# Patient Record
Sex: Female | Born: 1950
Health system: Southern US, Community
[De-identification: ages and names within clinical notes are randomized; demographics above are authoritative.]

## PROBLEM LIST (undated history)

## (undated) DIAGNOSIS — F32A Depression, unspecified: Secondary | ICD-10-CM

## (undated) DIAGNOSIS — M1990 Unspecified osteoarthritis, unspecified site: Secondary | ICD-10-CM

## (undated) DIAGNOSIS — N390 Urinary tract infection, site not specified: Secondary | ICD-10-CM

## (undated) DIAGNOSIS — K5792 Diverticulitis of intestine, part unspecified, without perforation or abscess without bleeding: Secondary | ICD-10-CM

## (undated) DIAGNOSIS — K648 Other hemorrhoids: Secondary | ICD-10-CM

## (undated) DIAGNOSIS — R51 Headache: Secondary | ICD-10-CM

## (undated) DIAGNOSIS — B019 Varicella without complication: Secondary | ICD-10-CM

## (undated) DIAGNOSIS — K219 Gastro-esophageal reflux disease without esophagitis: Secondary | ICD-10-CM

## (undated) DIAGNOSIS — G43909 Migraine, unspecified, not intractable, without status migrainosus: Secondary | ICD-10-CM

## (undated) DIAGNOSIS — Z8619 Personal history of other infectious and parasitic diseases: Secondary | ICD-10-CM

## (undated) DIAGNOSIS — T7840XA Allergy, unspecified, initial encounter: Secondary | ICD-10-CM

## (undated) DIAGNOSIS — Z148 Genetic carrier of other disease: Secondary | ICD-10-CM

## (undated) DIAGNOSIS — E785 Hyperlipidemia, unspecified: Secondary | ICD-10-CM

## (undated) DIAGNOSIS — K635 Polyp of colon: Secondary | ICD-10-CM

## (undated) HISTORY — DX: Headache: R51

## (undated) HISTORY — DX: Other hemorrhoids: K64.8

## (undated) HISTORY — DX: Urinary tract infection, site not specified: N39.0

## (undated) HISTORY — DX: Personal history of other infectious and parasitic diseases: Z86.19

## (undated) HISTORY — DX: Unspecified osteoarthritis, unspecified site: M19.90

## (undated) HISTORY — DX: Varicella without complication: B01.9

## (undated) HISTORY — DX: Diverticulitis of intestine, part unspecified, without perforation or abscess without bleeding: K57.92

## (undated) HISTORY — DX: Depression, unspecified: F32.A

## (undated) HISTORY — DX: Allergy, unspecified, initial encounter: T78.40XA

## (undated) HISTORY — DX: Polyp of colon: K63.5

## (undated) HISTORY — DX: Gastro-esophageal reflux disease without esophagitis: K21.9

## (undated) HISTORY — DX: Hyperlipidemia, unspecified: E78.5

## (undated) HISTORY — DX: Migraine, unspecified, not intractable, without status migrainosus: G43.909

## (undated) HISTORY — DX: Genetic carrier of other disease: Z14.8

---

## 1974-04-17 HISTORY — PX: CHOLECYSTECTOMY: SHX55

## 1986-04-17 HISTORY — PX: ABDOMINAL HYSTERECTOMY: SHX81

## 2004-05-09 ENCOUNTER — Ambulatory Visit: Payer: Self-pay | Admitting: Internal Medicine

## 2004-07-05 ENCOUNTER — Ambulatory Visit: Payer: Self-pay | Admitting: Internal Medicine

## 2005-10-23 ENCOUNTER — Ambulatory Visit: Payer: Self-pay | Admitting: Internal Medicine

## 2005-12-07 LAB — HM COLONOSCOPY

## 2006-04-17 HISTORY — PX: HERNIA REPAIR: SHX51

## 2006-05-02 ENCOUNTER — Ambulatory Visit: Payer: Self-pay | Admitting: Internal Medicine

## 2006-05-30 ENCOUNTER — Encounter: Payer: Self-pay | Admitting: Orthopedic Surgery

## 2006-06-16 ENCOUNTER — Encounter: Payer: Self-pay | Admitting: Orthopedic Surgery

## 2006-10-29 ENCOUNTER — Ambulatory Visit: Payer: Self-pay | Admitting: Nurse Practitioner

## 2006-12-08 LAB — HM PAP SMEAR

## 2007-09-25 ENCOUNTER — Ambulatory Visit: Payer: Self-pay | Admitting: Nurse Practitioner

## 2007-10-04 ENCOUNTER — Ambulatory Visit: Payer: Self-pay | Admitting: Nurse Practitioner

## 2007-10-31 ENCOUNTER — Ambulatory Visit: Payer: Self-pay | Admitting: Nurse Practitioner

## 2007-12-02 ENCOUNTER — Ambulatory Visit: Payer: Self-pay | Admitting: General Surgery

## 2007-12-10 ENCOUNTER — Ambulatory Visit: Payer: Self-pay | Admitting: General Surgery

## 2009-01-11 ENCOUNTER — Ambulatory Visit: Payer: Self-pay | Admitting: Nurse Practitioner

## 2010-01-17 ENCOUNTER — Ambulatory Visit: Payer: Self-pay | Admitting: Nurse Practitioner

## 2010-03-17 HISTORY — PX: GASTRIC BYPASS: SHX52

## 2011-01-31 ENCOUNTER — Ambulatory Visit: Payer: Self-pay

## 2011-03-27 ENCOUNTER — Ambulatory Visit: Payer: Self-pay

## 2011-12-08 ENCOUNTER — Ambulatory Visit (INDEPENDENT_AMBULATORY_CARE_PROVIDER_SITE_OTHER): Payer: BC Managed Care – PPO | Admitting: Internal Medicine

## 2011-12-08 ENCOUNTER — Encounter: Payer: Self-pay | Admitting: Internal Medicine

## 2011-12-08 VITALS — BP 140/82 | HR 64 | Temp 98.7°F | Ht 62.5 in | Wt 172.8 lb

## 2011-12-08 DIAGNOSIS — M25569 Pain in unspecified knee: Secondary | ICD-10-CM

## 2011-12-08 DIAGNOSIS — E669 Obesity, unspecified: Secondary | ICD-10-CM

## 2011-12-08 DIAGNOSIS — M1710 Unilateral primary osteoarthritis, unspecified knee: Secondary | ICD-10-CM

## 2011-12-08 DIAGNOSIS — M25562 Pain in left knee: Secondary | ICD-10-CM

## 2011-12-08 DIAGNOSIS — Z148 Genetic carrier of other disease: Secondary | ICD-10-CM | POA: Insufficient documentation

## 2011-12-08 DIAGNOSIS — K648 Other hemorrhoids: Secondary | ICD-10-CM

## 2011-12-08 DIAGNOSIS — Z8619 Personal history of other infectious and parasitic diseases: Secondary | ICD-10-CM | POA: Insufficient documentation

## 2011-12-08 MED ORDER — BUTALBITAL-APAP-CAFF-COD 50-325-40-30 MG PO CAPS: 1.0000 | ORAL_CAPSULE | Freq: Four times a day (QID) | ORAL | Status: DC | PRN

## 2011-12-08 NOTE — Patient Instructions (Addendum)
Return after next Monday for fasting labs.    Referral to [facility] under way

## 2011-12-08 NOTE — Progress Notes (Addendum)
Patient ID: Erin Aguilar, female   DOB: [birth year], 61 y.o.   MRN: [id number]   Patient Active Problem List  Diagnosis  . Hemorrhoids, internal, with bleeding  . Hemochromatosis carrier  . H/O: pertussis  . Knee pain, bilateral  . Obesity (BMI 30.0-34.9)    Subjective:  CC:   Chief Complaint  Patient presents with  . Establish Care    HPI:   Erin Aguilar a 61 y.o. female who presents Bilateral knee pain.  [facility] shots both knees  Left knee shots done x 3, which helped transiently.  None in 1 years.  Cannot walk or stand for prolonged periods of time.  Very painful getting out of bed.   2) Obesity .  She has a history of gastric bypass in Dec 2011  Done in [facility] Lost 60 lbs,  Wt within 2 lbs  Range.  Does not tolerate sweets without nuasea.  Wants referral to [facility],  Dr . Howell Rucks.    3) Migraine headaches ,preceidre by aura involving eyes treated with codeine containin gmedication to abort.  4) Osteoarthritis .  She cannot take  NSAIDs bc of gastric surgery which was managed with pain medication at night. Prior rheumatologic evaluation by [facility] was negative.  Transferring from [employer].  Last seen in Dec with breast exam only.   Past Medical History  Diagnosis Date  . Arthritis   . Chicken pox   . Diverticulitis   . Headache   . GERD (gastroesophageal reflux disease)   . Allergy     Hay fever  . Hyperlipidemia   . Migraines   . Colon polyp   . UTI (lower urinary tract infection)   . Hemorrhoids, internal, with bleeding     occasional  . Hemochromatosis carrier     sister has disease  . H/O: pertussis     Past Surgical History  Procedure Date  . Cholecystectomy 1976  . Abdominal hysterectomy 1988  . Gastric bypass 03/2010  . Hernia repair 2008    ventral ,  from scar tissue, [facility]         The following portions of the patient's history were reviewed and updated as appropriate: Allergies,  current medications, and problem list.    Review of Systems:   12 Pt  review of systems was negative except those addressed in the HPI,     History   Social History  . Marital Status: Married    Spouse Name: N/A    Number of Children: N/A  . Years of Education: N/A   Occupational History  . Not on file.   Social History Main Topics  . Smoking status: Former Smoker    Quit date: 12/08/1986  . Smokeless tobacco: Not on file  . Alcohol Use: 1.5 oz/week    3 drink(s) per week  . Drug Use: No  . Sexually Active: Not on file   Other Topics Concern  . Not on file   Social History Narrative  . No narrative on file    Objective:  BP 140/82  Pulse 64  Temp 98.7 F (37.1 C) (Oral)  Ht 5' 2.5" (1.588 m)  Wt 172 lb 12 oz (78.359 kg)  BMI 31.09 kg/m2  SpO2 97%  General appearance: alert, cooperative and appears stated age Ears: normal TM's and external ear canals both ears Throat: lips, mucosa, and tongue normal; teeth and gums normal Neck: no adenopathy, no carotid bruit, supple, symmetrical, trachea midline and thyroid  not enlarged, symmetric, no tenderness/mass/nodules Back: symmetric, no curvature. ROM normal. No CVA tenderness. Lungs: clear to auscultation bilaterally Heart: regular rate and rhythm, S1, S2 normal, no murmur, click, rub or gallop Abdomen: soft, non-tender; bowel sounds normal; no masses,  no organomegaly Pulses: 2+ and symmetric Skin: Skin color, texture, turgor normal. No rashes or lesions Lymph nodes: Cervical, supraclavicular, and axillary nodes normal.  Assessment and Plan:  Knee pain, bilateral Secondary to DJD.  Failed prior trial of synvisc and steroids. She is requesting referral to [facility] Dr. Joylene John.    Obesity (BMI 30.0-34.9) With prior gastric bypass surgery achieving wt loss of 60 lbs.  Will need fasting labs and vit D, B12 levels given current iatrogenically induced malnutrition.   Hemorrhoids, internal, with  bleeding continue fiber supplement and prn use of steroid suppositories.    Updated Medication List Outpatient Encounter Prescriptions as of 12/08/2011  Medication Sig Dispense Refill  . B-12, METHYLCOBALAMIN, SL Place 1 tablet under the tongue daily.      . butalbital-acetaminophen-caffeine (FIORICET WITH CODEINE) 50-325-40-30 MG per capsule Take 1 capsule by mouth every 6 (six) hours as needed.  60 capsule  2  . calcium-vitamin D (OSCAL) 250-125 MG-UNIT per tablet Take 1 tablet by mouth daily.      . cetirizine (ZYRTEC) 10 MG tablet Take 10 mg by mouth daily.      . DULoxetine (CYMBALTA) 60 MG capsule Take 60 mg by mouth daily.      Marland Kitchen esomeprazole (NEXIUM) 40 MG capsule Take 40 mg by mouth daily before breakfast.      . Multiple Vitamin (MULTIVITAMIN) tablet Take 1 tablet by mouth daily.      . promethazine (PHENERGAN) 25 MG tablet Take 25 mg by mouth every 6 (six) hours as needed.      Marland Kitchen DISCONTD: butalbital-acetaminophen-caffeine (FIORICET WITH CODEINE) 50-325-40-30 MG per capsule Take 1 capsule by mouth every 6 (six) hours as needed.          Orders Placed This Encounter  Procedures  . HM MAMMOGRAPHY  . HM MAMMOGRAPHY  . HM PAP SMEAR  . Ambulatory referral to Orthopedic Surgery  . HM COLONOSCOPY    No Follow-up on file.

## 2011-12-09 DIAGNOSIS — E669 Obesity, unspecified: Secondary | ICD-10-CM | POA: Insufficient documentation

## 2011-12-09 DIAGNOSIS — Z96653 Presence of artificial knee joint, bilateral: Secondary | ICD-10-CM | POA: Insufficient documentation

## 2011-12-09 NOTE — Assessment & Plan Note (Signed)
Secondary to DJD.  Failed prior trial of synvisc and steroids. She is requesting referral to [facility] Dr. Joylene John.

## 2011-12-09 NOTE — Assessment & Plan Note (Signed)
continue fiber supplement and prn use of steroid suppositories.

## 2011-12-09 NOTE — Assessment & Plan Note (Signed)
With prior gastric bypass surgery achieving wt loss of 60 lbs.  Will need fasting labs and vit D, B12 levels given current iatrogenically induced malnutrition.

## 2011-12-14 ENCOUNTER — Other Ambulatory Visit (INDEPENDENT_AMBULATORY_CARE_PROVIDER_SITE_OTHER): Payer: BC Managed Care – PPO | Admitting: *Deleted

## 2011-12-14 DIAGNOSIS — Z0000 Encounter for general adult medical examination without abnormal findings: Secondary | ICD-10-CM

## 2011-12-14 DIAGNOSIS — E785 Hyperlipidemia, unspecified: Secondary | ICD-10-CM

## 2011-12-14 DIAGNOSIS — E559 Vitamin D deficiency, unspecified: Secondary | ICD-10-CM

## 2011-12-14 LAB — COMPREHENSIVE METABOLIC PANEL
ALT: 25 U/L (ref 0–35)
Albumin: 4.1 g/dL (ref 3.5–5.2)
CO2: 28 mEq/L (ref 19–32)
Chloride: 104 mEq/L (ref 96–112)
GFR: 85.46 mL/min (ref >60.00)
Glucose, Bld: 84 mg/dL (ref 70–99)
Potassium: 4.2 mEq/L (ref 3.5–5.1)
Sodium: 140 mEq/L (ref 135–145)
Total Bilirubin: 0.8 mg/dL (ref 0.3–1.2)
Total Protein: 7 g/dL (ref 6.0–8.3)

## 2011-12-14 LAB — CBC WITH DIFFERENTIAL/PLATELET
Basophils Absolute: 0 10*3/uL (ref 0.0–0.1)
Eosinophils Relative: 4.9 % (ref 0.0–5.0)
HCT: 39.3 % (ref 36.0–46.0)
Lymphocytes Relative: 39.3 % (ref 12.0–46.0)
Lymphs Abs: 1.5 10*3/uL (ref 0.7–4.0)
Monocytes Absolute: 0.3 10*3/uL (ref 0.1–1.0)
Monocytes Relative: 8.7 % (ref 3.0–12.0)
Platelets: 203 10*3/uL (ref 150.0–400.0)
RDW: 12.3 % (ref 11.5–14.6)
WBC: 3.8 10*3/uL — ABNORMAL LOW (ref 4.5–10.5)

## 2011-12-14 LAB — LIPID PANEL
HDL: 56.6 mg/dL (ref >39.00)
Total CHOL/HDL Ratio: 3

## 2011-12-14 LAB — FERRITIN: Ferritin: 59 ng/mL (ref 10.0–291.0)

## 2011-12-14 LAB — VITAMIN B12: Vitamin B-12: >1500 pg/mL — ABNORMAL HIGH (ref 211–911)

## 2011-12-15 LAB — IRON AND TIBC
%SAT: 36 % (ref 20–55)
Iron: 100 ug/dL (ref 42–145)
TIBC: 281 ug/dL (ref 250–470)
UIBC: 181 ug/dL (ref 125–400)

## 2012-04-18 ENCOUNTER — Other Ambulatory Visit: Payer: Self-pay | Admitting: *Deleted

## 2012-04-18 MED ORDER — DULOXETINE HCL 60 MG PO CPEP: 60.0000 mg | ORAL_CAPSULE | Freq: Every day | ORAL | Status: DC

## 2012-04-18 NOTE — Telephone Encounter (Signed)
Ok to refill,  30 with 3 refills

## 2012-04-18 NOTE — Telephone Encounter (Signed)
Refill on cymbalta sent to [facility]. Patient notified.

## 2012-04-18 NOTE — Telephone Encounter (Signed)
Patient request refill on medication Cymbalta. Last office visit 11/2011. Please advise.

## 2012-05-25 LAB — HM PAP SMEAR

## 2012-06-10 ENCOUNTER — Ambulatory Visit (INDEPENDENT_AMBULATORY_CARE_PROVIDER_SITE_OTHER): Payer: BC Managed Care – PPO | Admitting: Internal Medicine

## 2012-06-10 ENCOUNTER — Encounter: Payer: Self-pay | Admitting: Internal Medicine

## 2012-06-10 VITALS — BP 110/82 | HR 60 | Temp 97.7°F | Ht 62.75 in | Wt 173.0 lb

## 2012-06-10 DIAGNOSIS — IMO0002 Reserved for concepts with insufficient information to code with codable children: Secondary | ICD-10-CM

## 2012-06-10 DIAGNOSIS — Z13220 Encounter for screening for lipoid disorders: Secondary | ICD-10-CM

## 2012-06-10 DIAGNOSIS — Z0000 Encounter for general adult medical examination without abnormal findings: Secondary | ICD-10-CM | POA: Insufficient documentation

## 2012-06-10 DIAGNOSIS — M8580 Other specified disorders of bone density and structure, unspecified site: Secondary | ICD-10-CM

## 2012-06-10 DIAGNOSIS — Z1239 Encounter for other screening for malignant neoplasm of breast: Secondary | ICD-10-CM

## 2012-06-10 DIAGNOSIS — N8111 Cystocele, midline: Secondary | ICD-10-CM

## 2012-06-10 DIAGNOSIS — K912 Postsurgical malabsorption, not elsewhere classified: Secondary | ICD-10-CM

## 2012-06-10 DIAGNOSIS — M25569 Pain in unspecified knee: Secondary | ICD-10-CM

## 2012-06-10 DIAGNOSIS — E669 Obesity, unspecified: Secondary | ICD-10-CM

## 2012-06-10 DIAGNOSIS — M25561 Pain in right knee: Secondary | ICD-10-CM

## 2012-06-10 DIAGNOSIS — Z9884 Bariatric surgery status: Secondary | ICD-10-CM

## 2012-06-10 DIAGNOSIS — R7989 Other specified abnormal findings of blood chemistry: Secondary | ICD-10-CM

## 2012-06-10 DIAGNOSIS — M899 Disorder of bone, unspecified: Secondary | ICD-10-CM

## 2012-06-10 LAB — CBC WITH DIFFERENTIAL/PLATELET
Eosinophils Relative: 2.1 % (ref 0.0–5.0)
HCT: 40.6 % (ref 36.0–46.0)
Hemoglobin: 13.8 g/dL (ref 12.0–15.0)
Lymphs Abs: 1.9 10*3/uL (ref 0.7–4.0)
MCHC: 33.9 g/dL (ref 30.0–36.0)
Monocytes Relative: 7 % (ref 3.0–12.0)
Neutro Abs: 3.7 10*3/uL (ref 1.4–7.7)
Platelets: 212 10*3/uL (ref 150.0–400.0)
RBC: 4.12 Mil/uL (ref 3.87–5.11)
WBC: 6.1 10*3/uL (ref 4.5–10.5)

## 2012-06-10 LAB — COMPREHENSIVE METABOLIC PANEL
ALT: 50 U/L — ABNORMAL HIGH (ref 0–35)
Albumin: 4.2 g/dL (ref 3.5–5.2)
Alkaline Phosphatase: 88 U/L (ref 39–117)
CO2: 28 mEq/L (ref 19–32)
GFR: 94.2 mL/min (ref >60.00)
Glucose, Bld: 96 mg/dL (ref 70–99)
Sodium: 140 mEq/L (ref 135–145)
Total Bilirubin: 1 mg/dL (ref 0.3–1.2)
Total Protein: 7.2 g/dL (ref 6.0–8.3)

## 2012-06-10 LAB — IRON AND TIBC
Iron: 148 ug/dL — ABNORMAL HIGH (ref 42–145)
TIBC: 323 ug/dL (ref 250–470)
UIBC: 175 ug/dL (ref 125–400)

## 2012-06-10 LAB — LIPID PANEL
Cholesterol: 202 mg/dL — ABNORMAL HIGH (ref 0–200)
VLDL: 25.6 mg/dL (ref 0.0–40.0)

## 2012-06-10 LAB — LDL CHOLESTEROL, DIRECT: Direct LDL: 106 mg/dL

## 2012-06-10 LAB — MAGNESIUM: Magnesium: 2.2 mg/dL (ref 1.5–2.5)

## 2012-06-10 MED ORDER — BUTALBITAL-APAP-CAFF-COD 50-325-40-30 MG PO CAPS: 1.0000 | ORAL_CAPSULE | Freq: Four times a day (QID) | ORAL | Status: DC | PRN

## 2012-06-10 MED ORDER — DULOXETINE HCL 60 MG PO CPEP: 60.0000 mg | ORAL_CAPSULE | Freq: Every day | ORAL | Status: DC

## 2012-06-10 MED ORDER — PROMETHAZINE HCL 25 MG PO TABS: 25.0000 mg | ORAL_TABLET | Freq: Four times a day (QID) | ORAL | Status: DC | PRN

## 2012-06-10 NOTE — Assessment & Plan Note (Signed)
Annual exam including breast , pelvic and PAP smear were done today. screenings were al brought up to date.

## 2012-06-10 NOTE — Assessment & Plan Note (Signed)
I have addressed  BMI and recommended a low glycemic index diet utilizing smaller more frequent meals to increase metabolism.  I have also recommended that patient start exercising with a goal of 30 minutes of aerobic exercise a minimum of 5 days per week. Screening for lipid disorders, thyroid and diabetes to be done today.   

## 2012-06-10 NOTE — Progress Notes (Signed)
Patient ID: Erin Aguilar, female   DOB: 1950-11-10, 62 y.o.   MRN: [id number]    Subjective:     Erin Aguilar is a 62 y.o. female and is here for a comprehensive physical exam. The patient reports m.  History   Social History  . Marital Status: Married    Spouse Name: N/A    Number of Children: N/A  . Years of Education: N/A   Occupational History  . Not on file.   Social History Main Topics  . Smoking status: Former Smoker    Quit date: 12/08/1986  . Smokeless tobacco: Not on file  . Alcohol Use: 1.5 oz/week    3 drink(s) per week  . Drug Use: No  . Sexually Active: Not on file   Other Topics Concern  . Not on file   Social History Narrative  . No narrative on file   Health Maintenance  Topic Date Due  . Influenza Vaccine  12/16/1948  . Tetanus/tdap  07/06/1967  . Zostavax  07/05/2008  . Pap Smear  12/07/2009  . Mammogram  03/26/2013  . Colonoscopy  12/08/2015    The following portions of the patient's history were reviewed and updated as appropriate: allergies, current medications, past family history, past medical history, past social history, past surgical history and problem list.  Review of Systems A comprehensive review of systems was negative.   Objective:    BP 110/82  Pulse 60  Temp(Src) 97.7 F (36.5 C) (Oral)  Ht 5' 2.75" (1.594 m)  Wt 173 lb (78.472 kg)  BMI 30.88 kg/m2  SpO2 97%   BP 110/82  Pulse 60  Temp(Src) 97.7 F (36.5 C) (Oral)  Ht 5' 2.75" (1.594 m)  Wt 173 lb (78.472 kg)  BMI 30.88 kg/m2  SpO2 97%  General Appearance:    Alert, cooperative, no distress, appears stated age  Head:    Normocephalic, without obvious abnormality, atraumatic  Eyes:    PERRL, conjunctiva/corneas clear, EOM's intact, fundi    benign, both eyes  Ears:    Normal TM's and external ear canals, both ears  Nose:   Nares normal, septum midline, mucosa normal, no drainage    or sinus tenderness  Throat:   Lips, mucosa, and tongue normal; teeth and  gums normal  Neck:   Supple, symmetrical, trachea midline, no adenopathy;    thyroid:  no enlargement/tenderness/nodules; no carotid   bruit or JVD  Back:     Symmetric, no curvature, ROM normal, no CVA tenderness  Lungs:     Clear to auscultation bilaterally, respirations unlabored  Chest Wall:    No tenderness or deformity   Heart:    Regular rate and rhythm, S1 and S2 normal, no murmur, rub   or gallop  Breast Exam:    No tenderness, masses, or nipple abnormality  Abdomen:     Soft, non-tender, bowel sounds active all four quadrants,    no masses, no organomegaly  Genitalia:    Pelvic: cervix normal in appearance, external genitalia normal, no adnexal masses or tenderness, no cervical motion tenderness, positive findings: cystocele, rectovaginal septum normal, uterus normal size, shape, and consistency and vagina normal without discharge     Extremities:   Extremities normal, atraumatic, no cyanosis or edema  Pulses:   2+ and symmetric all extremities  Skin:   Skin color, texture, turgor normal, no rashes or lesions  Lymph nodes:   Cervical, supraclavicular, and axillary nodes normal  Neurologic:   CNII-XII intact, normal strength,  sensation and reflexes    throughout     Assessment:   Obesity (BMI 30.0-34.9) I have addressed  BMI and recommended a low glycemic index diet utilizing smaller more frequent meals to increase metabolism.  I have also recommended that patient start exercising with a goal of 30 minutes of aerobic exercise a minimum of 5 days per week. Screening for lipid disorders, thyroid and diabetes to be done today.    Knee pain, bilateral Discussed need to consider treatmetn with TKR before her weigjt regain problems problematic  Routine general medical examination at a health care facility Annual exam including breast , pelvic and PAP smear were done today. screenings were al brought up to date.    Updated Medication List Outpatient Encounter Prescriptions as  of 06/10/2012  Medication Sig Dispense Refill  . B-12, METHYLCOBALAMIN, SL Place 1 tablet under the tongue daily.      . butalbital-acetaminophen-caffeine (FIORICET WITH CODEINE) 50-325-40-30 MG per capsule Take 1 capsule by mouth every 6 (six) hours as needed.  60 capsule  2  . calcium-vitamin D (OSCAL) 250-125 MG-UNIT per tablet Take 1 tablet by mouth daily.      . cetirizine (ZYRTEC) 10 MG tablet Take 10 mg by mouth daily.      . DULoxetine (CYMBALTA) 60 MG capsule Take 1 capsule (60 mg total) by mouth daily.  30 capsule  3  . esomeprazole (NEXIUM) 40 MG capsule Take 40 mg by mouth daily before breakfast.      . Multiple Vitamin (MULTIVITAMIN) tablet Take 1 tablet by mouth daily.      . promethazine (PHENERGAN) 25 MG tablet Take 1 tablet (25 mg total) by mouth every 6 (six) hours as needed.  30 tablet  1  . [DISCONTINUED] butalbital-acetaminophen-caffeine (FIORICET WITH CODEINE) 50-325-40-30 MG per capsule Take 1 capsule by mouth every 6 (six) hours as needed.  60 capsule  2  . [DISCONTINUED] DULoxetine (CYMBALTA) 60 MG capsule Take 1 capsule (60 mg total) by mouth daily.  30 capsule  3  . [DISCONTINUED] promethazine (PHENERGAN) 25 MG tablet Take 25 mg by mouth every 6 (six) hours as needed.       No facility-administered encounter medications on file as of 06/10/2012.

## 2012-06-10 NOTE — Progress Notes (Signed)
Patient requested refills on medications.

## 2012-06-10 NOTE — Assessment & Plan Note (Addendum)
Discussed need to consider treatmetn with TKR before her weigjt regain problems problematic

## 2012-06-10 NOTE — Patient Instructions (Addendum)
You have a cystocele (bladder is starting to fall from its original position in the pelvis).  I will refer you to [facility] for treatment   This can be treated with a pessary or with surgery   Please see =Amber to set up your mammogram and DEXA scan

## 2012-06-11 LAB — VITAMIN D 25 HYDROXY (VIT D DEFICIENCY, FRACTURES): Vit D, 25-Hydroxy: 26 ng/mL — ABNORMAL LOW (ref 30–89)

## 2012-06-12 DIAGNOSIS — R768 Other specified abnormal immunological findings in serum: Secondary | ICD-10-CM | POA: Insufficient documentation

## 2012-06-12 NOTE — Addendum Note (Signed)
Addended by: Sherlene Shams on: 06/12/2012 06:29 AM   Modules accepted: Orders

## 2012-06-13 ENCOUNTER — Ambulatory Visit: Payer: Self-pay | Admitting: Internal Medicine

## 2012-06-18 ENCOUNTER — Telehealth: Payer: Self-pay | Admitting: Internal Medicine

## 2012-06-18 DIAGNOSIS — M8580 Other specified disorders of bone density and structure, unspecified site: Secondary | ICD-10-CM

## 2012-06-18 DIAGNOSIS — M810 Age-related osteoporosis without current pathological fracture: Secondary | ICD-10-CM | POA: Insufficient documentation

## 2012-06-18 NOTE — Telephone Encounter (Signed)
Her  DEXA scan  showed mild to moderate bone loss in the osteopenia range. I recommend regular weight bearing exercise, 1200 mg of calcium (combined diet and supplements) ,  A Minimum of 1000 units of vitamin  D daily and  repeating it in 2 years to reassess.

## 2012-06-18 NOTE — Telephone Encounter (Signed)
Patient notified as instructed by telephone. 

## 2012-06-27 ENCOUNTER — Telehealth: Payer: Self-pay | Admitting: Internal Medicine

## 2012-06-27 MED ORDER — ESOMEPRAZOLE MAGNESIUM 40 MG PO CPDR: 40.0000 mg | DELAYED_RELEASE_CAPSULE | Freq: Every day | ORAL | Status: DC

## 2012-06-27 NOTE — Telephone Encounter (Signed)
Nexium refill needed.  [facility] [place].

## 2012-06-27 NOTE — Telephone Encounter (Signed)
Medication filled.  

## 2012-07-02 ENCOUNTER — Ambulatory Visit: Payer: Self-pay | Admitting: Internal Medicine

## 2012-07-04 ENCOUNTER — Encounter: Payer: Self-pay | Admitting: Internal Medicine

## 2012-07-10 ENCOUNTER — Other Ambulatory Visit (INDEPENDENT_AMBULATORY_CARE_PROVIDER_SITE_OTHER): Payer: BC Managed Care – PPO

## 2012-07-10 DIAGNOSIS — R7989 Other specified abnormal findings of blood chemistry: Secondary | ICD-10-CM

## 2012-07-10 LAB — HEPATIC FUNCTION PANEL
ALT: 36 U/L — ABNORMAL HIGH (ref 0–35)
AST: 24 U/L (ref 0–37)
Total Bilirubin: 0.7 mg/dL (ref 0.3–1.2)

## 2012-07-11 NOTE — Addendum Note (Signed)
Addended by: Sherlene Shams on: 07/11/2012 06:53 AM   Modules accepted: Orders

## 2012-07-15 ENCOUNTER — Other Ambulatory Visit (INDEPENDENT_AMBULATORY_CARE_PROVIDER_SITE_OTHER): Payer: BC Managed Care – PPO

## 2012-07-15 ENCOUNTER — Ambulatory Visit: Payer: Self-pay | Admitting: Internal Medicine

## 2012-07-15 DIAGNOSIS — R7989 Other specified abnormal findings of blood chemistry: Secondary | ICD-10-CM

## 2012-07-15 DIAGNOSIS — R768 Other specified abnormal immunological findings in serum: Secondary | ICD-10-CM

## 2012-07-15 LAB — IRON AND TIBC
Iron: 125 ug/dL (ref 42–145)
TIBC: 291 ug/dL (ref 250–470)
UIBC: 166 ug/dL (ref 125–400)

## 2012-07-15 LAB — FERRITIN: Ferritin: 65.8 ng/mL (ref 10.0–291.0)

## 2012-07-16 ENCOUNTER — Telehealth: Payer: Self-pay | Admitting: Internal Medicine

## 2012-07-16 LAB — HEPATITIS C ANTIBODY: HCV Ab: REACTIVE — AB

## 2012-07-16 LAB — ANTI-SMITH ANTIBODY: ENA SM Ab Ser-aCnc: 1 AU/mL (ref <30)

## 2012-07-16 LAB — ANTI-NUCLEAR AB-TITER (ANA TITER): ANA Titer 1: 1:80 {titer} — ABNORMAL HIGH

## 2012-07-16 LAB — HEPATITIS B CORE ANTIBODY, TOTAL: Hep B Core Total Ab: NEGATIVE

## 2012-07-16 LAB — HEPATITIS B SURFACE ANTIBODY,QUALITATIVE: Hep B S Ab: REACTIVE — AB

## 2012-07-16 NOTE — Addendum Note (Signed)
Addended by: Sherlene Shams on: 07/16/2012 06:56 AM   Modules accepted: Orders

## 2012-07-16 NOTE — Progress Notes (Signed)
Called pt was notified to call again after lunch.

## 2012-07-17 NOTE — Progress Notes (Signed)
Pt notified and labs faxed 

## 2012-07-24 ENCOUNTER — Encounter: Payer: Self-pay | Admitting: Internal Medicine

## 2012-07-31 ENCOUNTER — Encounter: Payer: Self-pay | Admitting: Internal Medicine

## 2012-10-02 ENCOUNTER — Telehealth: Payer: Self-pay | Admitting: Internal Medicine

## 2012-10-02 ENCOUNTER — Other Ambulatory Visit: Payer: Self-pay | Admitting: *Deleted

## 2012-10-02 NOTE — Telephone Encounter (Signed)
Pt spouse came in stated he went to pick up ms Stawicki rx for nexium  [facility] store stated it needed prior autho Pt is completely out of her meds

## 2012-10-03 MED ORDER — DULOXETINE HCL 60 MG PO CPEP: 60.0000 mg | ORAL_CAPSULE | Freq: Every day | ORAL | Status: DC

## 2012-10-03 NOTE — Telephone Encounter (Signed)
Erin Aguilar,  She can take OTC prevacid, zegerid or prilosec.  Please warn her that because I do not have a diagnosis of gastritis or Reflux ( required diagnoses and with  prior treatment failures documented using other proton pump inhibitors liek the ones I mentioned), she will not get approved by insurance so I would like her to try one of these first  For two weeks

## 2012-10-04 NOTE — Telephone Encounter (Signed)
Ok, we'll try with that information to get the PA

## 2012-10-04 NOTE — Telephone Encounter (Signed)
Patient states that she tried Aciphex at [facility] clinic in [place] and the prevacid does not help. Patient stated nothing over the counter is strong enough.

## 2012-11-13 ENCOUNTER — Telehealth: Payer: Self-pay | Admitting: Internal Medicine

## 2012-11-13 NOTE — Telephone Encounter (Signed)
Her insurance will no longer pay for her nexium unless we  Can document prior treatment failure with the OTC meds, like prilosec or prevacid, She will have to switch for a month to one of them  .  Due to the volume of these mandated changes we have been inundated with for every patient , we simply cannot devote the time (20 minutes per request per patient) to do prior authorizations unless it is absolutely necessary.  Therefore I would like her to try the new medication before making any requests for prior authorizations.

## 2012-11-15 ENCOUNTER — Telehealth: Payer: Self-pay | Admitting: *Deleted

## 2012-11-15 NOTE — Telephone Encounter (Signed)
Called and stated prior authorization needed by insurance called pharmacy form is being faxed to today.

## 2012-11-15 NOTE — Telephone Encounter (Signed)
Please disregard previous note talked with patient and she is willing to try some of the OTC recommendations you offered.

## 2013-01-22 ENCOUNTER — Encounter: Payer: Self-pay | Admitting: Internal Medicine

## 2013-01-22 ENCOUNTER — Ambulatory Visit (INDEPENDENT_AMBULATORY_CARE_PROVIDER_SITE_OTHER): Payer: BC Managed Care – PPO | Admitting: Internal Medicine

## 2013-01-22 VITALS — BP 118/72 | HR 64 | Temp 98.3°F | Resp 14 | Ht 62.75 in | Wt 175.8 lb

## 2013-01-22 DIAGNOSIS — G2581 Restless legs syndrome: Secondary | ICD-10-CM

## 2013-01-22 DIAGNOSIS — D508 Other iron deficiency anemias: Secondary | ICD-10-CM | POA: Insufficient documentation

## 2013-01-22 DIAGNOSIS — E66811 Obesity, class 1: Secondary | ICD-10-CM

## 2013-01-22 DIAGNOSIS — Z23 Encounter for immunization: Secondary | ICD-10-CM

## 2013-01-22 DIAGNOSIS — E669 Obesity, unspecified: Secondary | ICD-10-CM

## 2013-01-22 DIAGNOSIS — R894 Abnormal immunological findings in specimens from other organs, systems and tissues: Secondary | ICD-10-CM

## 2013-01-22 DIAGNOSIS — R768 Other specified abnormal immunological findings in serum: Secondary | ICD-10-CM

## 2013-01-22 DIAGNOSIS — R7689 Other specified abnormal immunological findings in serum: Secondary | ICD-10-CM

## 2013-01-22 MED ORDER — ROPINIROLE HCL 0.25 MG PO TABS: ORAL_TABLET | ORAL | Status: DC

## 2013-01-22 NOTE — Patient Instructions (Signed)
We are starting requip for restless legs  Start with 1 tablet ., take it 1 to 3 hours before bedtime   You may increase the dose each week by 1 tablet if needed until you reach a maximum of 4 tablets before bedtime

## 2013-01-22 NOTE — Progress Notes (Signed)
Patient ID: Erin Aguilar, female   DOB: 1950-12-11, 62 y.o.   MRN: [id number]   Patient Active Problem List   Diagnosis Date Noted  . Restless legs syndrome 01/22/2013  . Osteopenia 06/18/2012  . Hepatitis C antibody test positive 06/12/2012  . Routine general medical examination at a health care facility 06/10/2012  . Knee pain, bilateral 12/09/2011  . Obesity (BMI 30.0-34.9) 12/09/2011  . Hemorrhoids, internal, with bleeding   . Hemochromatosis carrier   . H/O: pertussis     Subjective:  CC:   Chief Complaint  Patient presents with  . Acute Visit    legs hurt and jerk at night  can not keep still.    HPI:   Erin Aguilar a 62 y.o. female who presents for evaluation of Uncomfortable feeling in her legs.  Symptoms started around 5 years ago,  Improved temporally  after her gastric bypass surgery in Dec 2011, but have returned.  Describes restless feeling in legs when she is relaxing at night,  occurs only with rest and is keeping her awake at night,  Feeling is improved temporarily with movement of legs. She is also having bilateral knee pain due to degenerative joint  disease.  She is keeping husband awake .  No snoring reported.     Past Medical History  Diagnosis Date  . Arthritis   . Chicken pox   . Diverticulitis   . Headache(784.0)   . GERD (gastroesophageal reflux disease)   . Allergy     Hay fever  . Hyperlipidemia   . Migraines   . Colon polyp   . UTI (lower urinary tract infection)   . Hemorrhoids, internal, with bleeding     occasional  . Hemochromatosis carrier     sister has disease  . H/O: pertussis     Past Surgical History  Procedure Laterality Date  . Cholecystectomy  1976  . Abdominal hysterectomy  1988  . Gastric bypass  03/2010  . Hernia repair  2008    ventral ,  from scar tissue, [place]       The following portions of the patient's history were reviewed and updated as appropriate: Allergies, current medications, and problem  list.    Review of Systems:   12 Pt  review of systems was negative except those addressed in the HPI,     History   Social History  . Marital Status: Married    Spouse Name: N/A    Number of Children: N/A  . Years of Education: N/A   Occupational History  . Not on file.   Social History Main Topics  . Smoking status: Former Smoker    Quit date: 12/08/1986  . Smokeless tobacco: Not on file  . Alcohol Use: 1.5 oz/week    3 drink(s) per week  . Drug Use: No  . Sexual Activity: Not on file   Other Topics Concern  . Not on file   Social History Narrative  . No narrative on file    Objective:  Filed Vitals:   01/22/13 1641  BP: 118/72  Pulse: 64  Temp: 98.3 F (36.8 C)  Resp: 14     General appearance: alert, cooperative and appears stated age Ears: normal TM's and external ear canals both ears Throat: lips, mucosa, and tongue normal; teeth and gums normal Neck: no adenopathy, no carotid bruit, supple, symmetrical, trachea midline and thyroid not enlarged, symmetric, no tenderness/mass/nodules Back: symmetric, no curvature. ROM normal. No CVA tenderness. Lungs: clear to auscultation  bilaterally Heart: regular rate and rhythm, S1, S2 normal, no murmur, click, rub or gallop Abdomen: soft, non-tender; bowel sounds normal; no masses,  no organomegaly Pulses: 2+ and symmetric Skin: Skin color, texture, turgor normal. No rashes or lesions Lymph nodes: Cervical, supraclavicular, and axillary nodes normal.  Assessment and Plan:  Restless legs syndrome trila of requip.  Iron studies were normal  in April for elevated liver enzymes.   Obesity (BMI 30.0-34.9) She remains obese despite undergoing bariatric surgery in Dec 2011. Barriers to additional wt loss include lack of exercise due to bilateral knee pain from DJD.I have addressed  BMI and recommended wt loss of 10% of body weigh over the next 6 months using a low glycemic index diet and water aerobics for  exercise a minimum of 5 days per week.    Hepatitis C antibody test positive Referred to Clydie Braun April 2014. Unclear if patient returned for follow up on genotyping .  She needs to have Hep A and B vaccines started at next visit and follow up with DF    Updated Medication List Outpatient Encounter Prescriptions as of 01/22/2013  Medication Sig Dispense Refill  . butalbital-acetaminophen-caffeine (FIORICET WITH CODEINE) 50-325-40-30 MG per capsule Take 1 capsule by mouth every 6 (six) hours as needed.  60 capsule  2  . calcium-vitamin D (OSCAL) 250-125 MG-UNIT per tablet Take 1 tablet by mouth daily.      . cetirizine (ZYRTEC) 10 MG tablet Take 10 mg by mouth daily.      . DULoxetine (CYMBALTA) 60 MG capsule Take 1 capsule (60 mg total) by mouth daily.  30 capsule  3  . esomeprazole (NEXIUM) 40 MG capsule Take 1 capsule (40 mg total) by mouth daily before breakfast.  30 capsule  6  . Multiple Vitamin (MULTIVITAMIN) tablet Take 1 tablet by mouth daily.      . promethazine (PHENERGAN) 25 MG tablet Take 1 tablet (25 mg total) by mouth every 6 (six) hours as needed.  30 tablet  1  . rOPINIRole (REQUIP) 0.25 MG tablet 1 tablet up to 3 hours before bedtime.May increase weekly by 1 tablet (2 tablets week 2, 3 tablets week 3 ) up to 4 tablets maximum if needed  70 tablet  0  . [DISCONTINUED] B-12, METHYLCOBALAMIN, SL Place 1 tablet under the tongue daily.       No facility-administered encounter medications on file as of 01/22/2013.     Orders Placed This Encounter  Procedures  . Tdap vaccine greater than or equal to 7yo IM  . Pneumococcal conjugate vaccine 13-valent less than 5yo IM    No Follow-up on file.

## 2013-01-22 NOTE — Assessment & Plan Note (Addendum)
trila of requip.  Iron studies were normal  in April for elevated liver enzymes.

## 2013-01-25 NOTE — Assessment & Plan Note (Addendum)
Referred to Erin Aguilar April 2014. Unclear if patient returned for follow up on genotyping .  She needs to have Hep A and B vaccines started at next visit and follow up with DF

## 2013-01-25 NOTE — Assessment & Plan Note (Signed)
She remains obese despite undergoing bariatric surgery in Dec 2011. Barriers to additional wt loss include lack of exercise due to bilateral knee pain from DJD.I have addressed  BMI and recommended wt loss of 10% of body weigh over the next 6 months using a low glycemic index diet and water aerobics for exercise a minimum of 5 days per week.

## 2013-02-13 ENCOUNTER — Other Ambulatory Visit: Payer: Self-pay | Admitting: Internal Medicine

## 2013-02-14 ENCOUNTER — Other Ambulatory Visit: Payer: Self-pay | Admitting: *Deleted

## 2013-02-14 MED ORDER — BUTALBITAL-APAP-CAFF-COD 50-325-40-30 MG PO CAPS: 1.0000 | ORAL_CAPSULE | Freq: Four times a day (QID) | ORAL | Status: DC | PRN

## 2013-02-14 NOTE — Telephone Encounter (Signed)
Refill

## 2013-02-20 ENCOUNTER — Ambulatory Visit (INDEPENDENT_AMBULATORY_CARE_PROVIDER_SITE_OTHER): Payer: BC Managed Care – PPO | Admitting: Internal Medicine

## 2013-02-20 VITALS — BP 118/84 | HR 64 | Temp 98.1°F | Ht 62.75 in | Wt 169.8 lb

## 2013-02-20 DIAGNOSIS — Z23 Encounter for immunization: Secondary | ICD-10-CM

## 2013-02-20 DIAGNOSIS — E669 Obesity, unspecified: Secondary | ICD-10-CM

## 2013-02-20 DIAGNOSIS — R894 Abnormal immunological findings in specimens from other organs, systems and tissues: Secondary | ICD-10-CM

## 2013-02-20 DIAGNOSIS — R768 Other specified abnormal immunological findings in serum: Secondary | ICD-10-CM

## 2013-02-20 DIAGNOSIS — E559 Vitamin D deficiency, unspecified: Secondary | ICD-10-CM

## 2013-02-20 DIAGNOSIS — R748 Abnormal levels of other serum enzymes: Secondary | ICD-10-CM

## 2013-02-20 DIAGNOSIS — G2581 Restless legs syndrome: Secondary | ICD-10-CM

## 2013-02-20 DIAGNOSIS — E66811 Obesity, class 1: Secondary | ICD-10-CM

## 2013-02-20 MED ORDER — ROPINIROLE HCL 0.5 MG PO TABS: 0.5000 mg | ORAL_TABLET | Freq: Every day | ORAL | Status: DC

## 2013-02-20 NOTE — Progress Notes (Signed)
Pre visit review using our clinic review tool, if applicable. No additional management support is needed unless otherwise documented below in the visit note. 

## 2013-02-20 NOTE — Progress Notes (Signed)
Patient ID: Erin Aguilar, female   DOB: [birth year], 61 y.o.   MRN: [id number]  Patient Active Problem List   Diagnosis Date Noted  . Elevated liver enzymes 02/22/2013  . Restless legs syndrome 01/22/2013  . Osteopenia 06/18/2012  . Hepatitis C antibody test positive 06/12/2012  . Routine general medical examination at a health care facility 06/10/2012  . Knee pain, bilateral 12/09/2011  . Obesity (BMI 30.0-34.9) 12/09/2011  . Hemorrhoids, internal, with bleeding   . Hemochromatosis carrier   . H/O: pertussis     Subjective:  CC:   Chief Complaint  Patient presents with  . Follow-up    4 week     HPI:   Erin Aguilar a 62 y.o. female who presents for follow up on chronicconditons. 1) Follow up on Restless legs syndrome.  She was prescribed low dose Requip four weeks ago and increased the dose to 2 tablets recently.  She is sleeping better and denies any side effects. 2) Elevated liver enzymes,  Positive hepatitis c screen  3) Obesity:  She has lost 6 lbs with no real effort but has been paying more attention to her diet.   Past Medical History  Diagnosis Date  . Arthritis   . Chicken pox   . Diverticulitis   . Headache(784.0)   . GERD (gastroesophageal reflux disease)   . Allergy     Hay fever  . Hyperlipidemia   . Migraines   . Colon polyp   . UTI (lower urinary tract infection)   . Hemorrhoids, internal, with bleeding     occasional  . Hemochromatosis carrier     sister has disease  . H/O: pertussis     Past Surgical History  Procedure Laterality Date  . Cholecystectomy  1976  . Abdominal hysterectomy  1988  . Gastric bypass  03/2010  . Hernia repair  2008    ventral ,  from scar tissue, [place]       The following portions of the patient's history were reviewed and updated as appropriate: Allergies, current medications, and problem list.    Review of Systems:   12 Pt  review of systems was negative except those addressed in the  HPI,     History   Social History  . Marital Status: Married    Spouse Name: N/A    Number of Children: N/A  . Years of Education: N/A   Occupational History  . Not on file.   Social History Main Topics  . Smoking status: Former Smoker    Quit date: 12/08/1986  . Smokeless tobacco: Not on file  . Alcohol Use: 1.5 oz/week    3 drink(s) per week  . Drug Use: No  . Sexual Activity: Not on file   Other Topics Concern  . Not on file   Social History Narrative  . No narrative on file    Objective:  Filed Vitals:   02/20/13 0859  BP: 118/84  Pulse: 64  Temp: 98.1 F (36.7 C)     General appearance: alert, cooperative and appears stated age Ears: normal TM's and external ear canals both ears Throat: lips, mucosa, and tongue normal; teeth and gums normal Neck: no adenopathy, no carotid bruit, supple, symmetrical, trachea midline and thyroid not enlarged, symmetric, no tenderness/mass/nodules Back: symmetric, no curvature. ROM normal. No CVA tenderness. Lungs: clear to auscultation bilaterally Heart: regular rate and rhythm, S1, S2 normal, no murmur, click, rub or gallop Abdomen: soft, non-tender; bowel sounds normal; no masses,  no  organomegaly Pulses: 2+ and symmetric Skin: Skin color, texture, turgor normal. No rashes or lesions Lymph nodes: Cervical, supraclavicular, and axillary nodes normal.  Assessment and Plan:  Restless legs syndrome Symptoms are controlled with 0.5 mg requip.   Hepatitis C antibody test positive She was referred to [facility] in April after her elevated liver enzymes led to a positive Hepatitis C test.  The test was repeated by Dr Sampson Goon and was negative  Obesity (BMI 30.0-34.9) She has lost 6 lbs since last visit . Low gi diet discussed and recommended  Elevated liver enzymes Etiology unclear , with recent serologic workup negative for iron overload, viral hepatitis (hep c testing repeated by ID and negative).  Ultrasounf  did not suggest fatty liver, but given her history of gastric bypass for obesity and regain of weight,  Low GI diet and Hepatitis A vaccination recommended.    Updated Medication List Outpatient Encounter Prescriptions as of 02/20/2013  Medication Sig  . butalbital-acetaminophen-caffeine (FIORICET WITH CODEINE) 50-325-40-30 MG per capsule Take 1 capsule by mouth every 6 (six) hours as needed.  . calcium-vitamin D (OSCAL) 250-125 MG-UNIT per tablet Take 1 tablet by mouth daily.  . cetirizine (ZYRTEC) 10 MG tablet Take 10 mg by mouth daily.  . DULoxetine (CYMBALTA) 60 MG capsule Take 1 capsule (60 mg total) by mouth daily.  Marland Kitchen esomeprazole (NEXIUM) 40 MG capsule Take 1 capsule (40 mg total) by mouth daily before breakfast.  . Multiple Vitamin (MULTIVITAMIN) tablet Take 1 tablet by mouth daily.  . promethazine (PHENERGAN) 25 MG tablet TAKE (1) TABLET BY MOUTH EVERY SIX HOURS AS NEEDED.  Marland Kitchen rOPINIRole (REQUIP) 0.5 MG tablet Take 1 tablet (0.5 mg total) by mouth at bedtime. 1 tablet up to 3 hours before bedtime.May increase weekly by 1 tablet (2 tablets week 2, 3 tablets week 3 ) up to 4 tablets maximum if needed  . [DISCONTINUED] rOPINIRole (REQUIP) 0.25 MG tablet 1 tablet up to 3 hours before bedtime.May increase weekly by 1 tablet (2 tablets week 2, 3 tablets week 3 ) up to 4 tablets maximum if needed     No orders of the defined types were placed in this encounter.    No Follow-up on file.

## 2013-02-20 NOTE — Patient Instructions (Signed)
Your Requip has been refilled at the 0..5 mg dose today  You received the hepatitis A vaccine, and are due for the second and final dose in 6 months  Please return in 6 months for an office visit with fasting labs prior to visit .  Congratulations on losing weight !  ( Imagine what you can do if you actually tried.Marland Kitchen)  Your Goal is 165 lbs  To get your BMI < 30  .  (Eventually I would like you to work towards getting down to 140 lbs to get BMI < 25)  I recommend a low glycemic index diet to achieve these goals.  This is  One version of a  "Low GI"  Diet:  It will still lower your blood sugars and allow you to lose 4 to 8  lbs  per month if you follow it carefully.  Your goal with exercise is a minimum of 30 minutes of aerobic exercise 5 days per week (Walking does not count once it becomes easy!)    All of the foods can be found at grocery stores and in bulk at [employer].  The Atkins protein bars and shakes are available in more varieties at Target, WalMart and [employer]'s Foods.     7 AM Breakfast:  Choose from the following:  Low carbohydrate Protein  Shakes (I recommend the EAS AdvantEdge "Carb Control" shakes  Or the low carb shakes by Atkins.    2.5 carbs   Arnold's "Sandwhich Thin"toasted  w/ peanut butter (no jelly: about 20 net carbs  "Bagel Thin" with cream cheese and salmon: about 20 carbs   a scrambled egg/bacon/cheese burrito made with Mission's "carb balance" whole wheat tortilla  (about 10 net carbs )   Avoid cereal and bananas, oatmeal and cream of wheat and grits. They are loaded with carbohydrates!   10 AM: high protein snack  Protein bar by Atkins (the snack size, under 200 cal, usually < 6 net carbs).    A stick of cheese:  Around 1 carb,  100 cal     Dannon Light n Fit [place] Yogurt  (80 cal, 8 carbs)  Other so called "protein bars" and Greek yogurts tend to be loaded with carbohydrates.  Remember, in food advertising, the word "energy" is synonymous for "  carbohydrate."  Lunch:   A Sandwich using the bread choices listed, Can use any  Eggs,  lunchmeat, grilled meat or canned tuna), avocado, regular mayo/mustard  and cheese.  A Salad using blue cheese, ranch,  Goddess or vinagrette,  No croutons or "confetti" and no "candied nuts" but regular nuts OK.   No pretzels or chips.  Pickles and miniature sweet peppers are a good low carb alternative that provide a "crunch"  The bread is the only source of carbohydrate in a sandwich and  can be decreased by trying some of these alternatives to traditional loaf bread  Joseph's makes a pita bread and a flat bread that are 50 cal and 4 net carbs available at BJs and WalMart.  This can be toasted to use with hummous as well  Toufayan makes a low carb flatbread that's 100 cal and 9 net carbs available at [employer] and [employer] makes 2 sizes of  Low carb whole wheat tortilla  (The large one is 210 cal and 6 net carbs) Avoid "Low fat dressings, as well as Reyne Dumas and [address] dressings They are loaded with sugar!   3 PM/ Mid day  Snack:  Consider  1 ounce of  almonds, walnuts, pistachios, pecans, peanuts,  Macadamia nuts or a nut medley.  Avoid "granola"; the dried cranberries and raisins are loaded with carbohydrates. Mixed nuts as long as there are no raisins,  cranberries or dried fruit.     6 PM  Dinner:     Meat/fowl/fish with a green salad, and either broccoli, cauliflower, green beans, spinach, brussel sprouts or  Lima beans. DO NOT BREAD THE PROTEIN!!      There is a low carb pasta by Dreamfield's that is acceptable and tastes great: only 5 digestible carbs/serving.( All grocery stores but BJs carry it )  Try Kai Levins Angelo's chicken piccata or chicken or eggplant parm over low carb pasta.(Lowes and BJs)   Clifton Custard Sanchez's "Carnitas" (pulled pork, no sauce,  0 carbs) or his beef pot roast to make a dinner burrito (at BJ's)  Pesto over low carb pasta (bj's sells a good quality pesto in the  center refrigerated section of the deli   Whole wheat pasta is still full of digestible carbs and  Not as low in glycemic index as Dreamfield's.   Brown rice is still rice,  So skip the rice and noodles if you eat [place] or [place] (or at least limit to 1/2 cup)  9 PM snack :   Breyer's "low carb" fudgsicle or  ice cream bar (Carb Smart line), or  Weight Watcher's ice cream bar , or another "no sugar added" ice cream;  a serving of fresh berries/cherries with whipped cream   Cheese or DANNON'S LlGHT [place] FIT [place] YOGURT  Avoid bananas, pineapple, grapes  and watermelon on a regular basis because they are high in sugar.  THINK OF THEM AS DESSERT  Remember that snack Substitutions should be less than 10 NET carbs per serving and meals < 20 carbs. Remember to subtract fiber grams to get the "net carbs."

## 2013-02-22 ENCOUNTER — Encounter: Payer: Self-pay | Admitting: Internal Medicine

## 2013-02-22 DIAGNOSIS — R748 Abnormal levels of other serum enzymes: Secondary | ICD-10-CM | POA: Insufficient documentation

## 2013-02-22 NOTE — Assessment & Plan Note (Signed)
Symptoms are controlled with 0.5 mg requip.

## 2013-02-22 NOTE — Assessment & Plan Note (Signed)
She has lost 6 lbs since last visit . Low gi diet discussed and recommended

## 2013-02-22 NOTE — Assessment & Plan Note (Signed)
She was referred to Erin Aguilar in April after her elevated liver enzymes led to a positive Hepatitis C test.  The test was repeated by Dr Erin Aguilar and was negative

## 2013-02-22 NOTE — Assessment & Plan Note (Signed)
Etiology unclear , with recent serologic workup negative for iron overload, viral hepatitis (hep c testing repeated by ID and negative).  Ultrasounf did not suggest fatty liver, but given her history of gastric bypass for obesity and regain of weight,  Low GI diet and Hepatitis A vaccination recommended.

## 2013-02-24 NOTE — Addendum Note (Signed)
Addended by: Bethann Punches E on: 02/24/2013 08:09 AM   Modules accepted: Orders

## 2013-03-04 ENCOUNTER — Other Ambulatory Visit: Payer: Self-pay | Admitting: Internal Medicine

## 2013-03-31 ENCOUNTER — Telehealth: Payer: Self-pay | Admitting: Internal Medicine

## 2013-03-31 ENCOUNTER — Ambulatory Visit (INDEPENDENT_AMBULATORY_CARE_PROVIDER_SITE_OTHER): Payer: BC Managed Care – PPO | Admitting: Internal Medicine

## 2013-03-31 DIAGNOSIS — B9620 Unspecified Escherichia coli [E. coli] as the cause of diseases classified elsewhere: Secondary | ICD-10-CM | POA: Insufficient documentation

## 2013-03-31 DIAGNOSIS — N390 Urinary tract infection, site not specified: Secondary | ICD-10-CM | POA: Insufficient documentation

## 2013-03-31 LAB — POCT URINALYSIS DIPSTICK
Bilirubin, UA: NEGATIVE
Glucose, UA: NEGATIVE
Ketones, UA: NEGATIVE
Nitrite, UA: POSITIVE
Protein, UA: 30
Spec Grav, UA: 1.025
Urobilinogen, UA: 0.2
pH, UA: 6

## 2013-03-31 MED ORDER — SULFAMETHOXAZOLE-TRIMETHOPRIM 800-160 MG PO TABS: 1.0000 | ORAL_TABLET | Freq: Two times a day (BID) | ORAL | Status: DC

## 2013-03-31 NOTE — Telephone Encounter (Signed)
Patient called this morning stating she is hurting and burning when she urinates. She would like to come by to give a UA sample and get medication. Please advise where you would like me to put patient.

## 2013-03-31 NOTE — Progress Notes (Signed)
Patient ID: Erin Aguilar, female   DOB: Nov 18, 1950, 62 y.o.   MRN: [id number]  patient dropped off a urine,  Could not stay because she had a family member in the ER>

## 2013-03-31 NOTE — Telephone Encounter (Signed)
Patient scheduled t come in for UA at 9.15 but will be here as soon as possible.

## 2013-04-02 ENCOUNTER — Ambulatory Visit: Payer: BC Managed Care – PPO | Admitting: Internal Medicine

## 2013-04-02 LAB — URINE CULTURE

## 2013-04-03 NOTE — Telephone Encounter (Signed)
Caller: Andrea/Patient; Phone: [phone]; Reason for Call: Patient is calling regarding she has been treated for a UTI.  Just finished her medication.  She is continuing to have some buring when she urinates.  She denies fever or feeling bad.  She states she just feels that the UTI has not cleared all the way.  She does note that she has had marked improvement with the medication.  She would like to know if Dr.  Darrick Huntsman feels she needs another round of antibiotics?  Please notify her.  Thanks.

## 2013-04-04 ENCOUNTER — Ambulatory Visit: Payer: BC Managed Care – PPO | Admitting: Internal Medicine

## 2013-04-04 NOTE — Telephone Encounter (Signed)
Pt called back and left VM, states has felt improved this morning since getting up, declines to keep appt at this time, will call back with persistence or worsening of symptoms.

## 2013-04-04 NOTE — Telephone Encounter (Signed)
Pt notified, added to schedule today at 11:15 per Dr. Dan Humphreys.

## 2013-04-04 NOTE — Telephone Encounter (Signed)
Needs to come in for visit to check urine and decide.

## 2013-04-08 ENCOUNTER — Other Ambulatory Visit: Payer: Self-pay | Admitting: *Deleted

## 2013-04-08 NOTE — Telephone Encounter (Signed)
Pt left VM, requesting refill on Phenergan, ok to refill?

## 2013-04-11 MED ORDER — PROMETHAZINE HCL 25 MG PO TABS: ORAL_TABLET | ORAL | Status: DC

## 2013-06-13 ENCOUNTER — Encounter: Payer: BC Managed Care – PPO | Admitting: Internal Medicine

## 2013-07-07 ENCOUNTER — Encounter: Payer: BC Managed Care – PPO | Admitting: Internal Medicine

## 2013-07-16 ENCOUNTER — Other Ambulatory Visit: Payer: Self-pay | Admitting: Internal Medicine

## 2013-07-23 ENCOUNTER — Encounter (INDEPENDENT_AMBULATORY_CARE_PROVIDER_SITE_OTHER): Payer: Self-pay

## 2013-07-23 ENCOUNTER — Ambulatory Visit (INDEPENDENT_AMBULATORY_CARE_PROVIDER_SITE_OTHER): Payer: Medicare Other | Admitting: Internal Medicine

## 2013-07-23 ENCOUNTER — Encounter: Payer: Self-pay | Admitting: Internal Medicine

## 2013-07-23 VITALS — BP 108/72 | HR 64 | Temp 98.2°F | Resp 16 | Ht 63.5 in | Wt 169.2 lb

## 2013-07-23 DIAGNOSIS — Z9884 Bariatric surgery status: Secondary | ICD-10-CM

## 2013-07-23 DIAGNOSIS — E785 Hyperlipidemia, unspecified: Secondary | ICD-10-CM

## 2013-07-23 DIAGNOSIS — N8111 Cystocele, midline: Secondary | ICD-10-CM

## 2013-07-23 DIAGNOSIS — IMO0002 Reserved for concepts with insufficient information to code with codable children: Secondary | ICD-10-CM

## 2013-07-23 DIAGNOSIS — R5381 Other malaise: Secondary | ICD-10-CM

## 2013-07-23 DIAGNOSIS — R768 Other specified abnormal immunological findings in serum: Secondary | ICD-10-CM

## 2013-07-23 DIAGNOSIS — Z1239 Encounter for other screening for malignant neoplasm of breast: Secondary | ICD-10-CM

## 2013-07-23 DIAGNOSIS — R748 Abnormal levels of other serum enzymes: Secondary | ICD-10-CM

## 2013-07-23 DIAGNOSIS — R5383 Other fatigue: Secondary | ICD-10-CM

## 2013-07-23 DIAGNOSIS — E559 Vitamin D deficiency, unspecified: Secondary | ICD-10-CM

## 2013-07-23 DIAGNOSIS — G2581 Restless legs syndrome: Secondary | ICD-10-CM

## 2013-07-23 DIAGNOSIS — E66811 Obesity, class 1: Secondary | ICD-10-CM

## 2013-07-23 DIAGNOSIS — Z0000 Encounter for general adult medical examination without abnormal findings: Secondary | ICD-10-CM

## 2013-07-23 DIAGNOSIS — E669 Obesity, unspecified: Secondary | ICD-10-CM

## 2013-07-23 DIAGNOSIS — R894 Abnormal immunological findings in specimens from other organs, systems and tissues: Secondary | ICD-10-CM

## 2013-07-23 LAB — COMPREHENSIVE METABOLIC PANEL
ALT: 19 U/L (ref 0–35)
AST: 18 U/L (ref 0–37)
Albumin: 4 g/dL (ref 3.5–5.2)
Alkaline Phosphatase: 76 U/L (ref 39–117)
BILIRUBIN TOTAL: 0.6 mg/dL (ref 0.3–1.2)
BUN: 14 mg/dL (ref 6–23)
CO2: 27 meq/L (ref 19–32)
CREATININE: 0.6 mg/dL (ref 0.4–1.2)
Calcium: 9.2 mg/dL (ref 8.4–10.5)
Chloride: 106 mEq/L (ref 96–112)
GFR: 102.66 mL/min (ref >60.00)
Glucose, Bld: 96 mg/dL (ref 70–99)
Potassium: 4.2 mEq/L (ref 3.5–5.1)
Sodium: 140 mEq/L (ref 135–145)
Total Protein: 7 g/dL (ref 6.0–8.3)

## 2013-07-23 LAB — TSH: TSH: 0.81 u[IU]/mL (ref 0.35–5.50)

## 2013-07-23 LAB — LIPID PANEL
CHOL/HDL RATIO: 3
Cholesterol: 172 mg/dL (ref 0–200)
HDL: 56.7 mg/dL (ref >39.00)
LDL Cholesterol: 91 mg/dL (ref 0–99)
Triglycerides: 122 mg/dL (ref 0.0–149.0)
VLDL: 24.4 mg/dL (ref 0.0–40.0)

## 2013-07-23 LAB — CBC WITH DIFFERENTIAL/PLATELET
BASOS ABS: 0 10*3/uL (ref 0.0–0.1)
Basophils Relative: 0.8 % (ref 0.0–3.0)
Eosinophils Absolute: 0.1 10*3/uL (ref 0.0–0.7)
Eosinophils Relative: 3.1 % (ref 0.0–5.0)
HEMATOCRIT: 38.8 % (ref 36.0–46.0)
Hemoglobin: 13.4 g/dL (ref 12.0–15.0)
LYMPHS ABS: 1.7 10*3/uL (ref 0.7–4.0)
Lymphocytes Relative: 36.7 % (ref 12.0–46.0)
MCHC: 34.5 g/dL (ref 30.0–36.0)
MCV: 100.8 fl — ABNORMAL HIGH (ref 78.0–100.0)
MONO ABS: 0.4 10*3/uL (ref 0.1–1.0)
Monocytes Relative: 8.1 % (ref 3.0–12.0)
Neutro Abs: 2.4 10*3/uL (ref 1.4–7.7)
Neutrophils Relative %: 51.3 % (ref 43.0–77.0)
Platelets: 206 10*3/uL (ref 150.0–400.0)
RBC: 3.85 Mil/uL — ABNORMAL LOW (ref 3.87–5.11)
RDW: 11.9 % (ref 11.5–14.6)
WBC: 4.6 10*3/uL (ref 4.5–10.5)

## 2013-07-23 LAB — HEMOGLOBIN A1C: HEMOGLOBIN A1C: 5.8 % (ref 4.6–6.5)

## 2013-07-23 MED ORDER — DULOXETINE HCL 60 MG PO CPEP
60.0000 mg | ORAL_CAPSULE | Freq: Every day | ORAL | Status: DC
Start: 2013-07-23 — End: 2013-09-16

## 2013-07-23 MED ORDER — PRAMIPEXOLE DIHYDROCHLORIDE 0.125 MG PO TABS: 0.1250 mg | ORAL_TABLET | Freq: Three times a day (TID) | ORAL | Status: DC

## 2013-07-23 MED ORDER — PROMETHAZINE HCL 25 MG PO TABS: ORAL_TABLET | ORAL | Status: DC

## 2013-07-23 NOTE — Assessment & Plan Note (Addendum)
Annual comprehensive exam was done including breast, excluding pelvic exam.  She is s/p hysterectomy and planning to return to Dr Doy HutchingWeidner for surgical correction of cystocele.   All screenings have been addressed .

## 2013-07-23 NOTE — Assessment & Plan Note (Signed)
BMI remains just below 30.  She is happy with her weight , but I have addressed  BMI and recommended wt loss of 10% of body weigh over the next 6 months using a low glycemic index diet and regular exercise a minimum of 5 days per week.

## 2013-07-23 NOTE — Progress Notes (Signed)
Pre-visit discussion using our clinic review tool. No additional management support is needed unless otherwise documented below in the visit note.  

## 2013-07-23 NOTE — Assessment & Plan Note (Signed)
Repeat testing by Sampson GoonFitzgerald was negative. Patient has no known risk factors.

## 2013-07-23 NOTE — Assessment & Plan Note (Signed)
Managed with 2 mg requip, now discontinued by patient due to nausea and concern for drug interactiosn raised by her pharmacy/insurance.  Trial of mirapex ,  Dose can be titrated weekly.

## 2013-07-23 NOTE — Assessment & Plan Note (Addendum)
BMI now below 30.  I have recommended a low glycemic index diet utilizing smaller more frequent meals to increase metabolism.  I have also recommended that patient start exercising with a goal of 30 minutes of aerobic exercise a minimum of 5 days per week. Screening for lipid disorders, thyroid and diabetes was done today and all labs are normal.

## 2013-07-23 NOTE — Progress Notes (Signed)
Patient ID: Erin Aguilar, female   DOB: Apr 12, 1951, 63 y.o.   MRN: [id number]  The patient is here for her first Medicare wellness examination and management of other chronic and acute problems.  She stopped taking Requip for restless legs due to concerns raised by her insurance about possible drug interactions with one of her other medications.  She also attributed her recurrent nausea top the medication.  She is requesting an alternative, as her legs start bothering her in  the evening around bedtime. Husband notes persistent leg motion while asleep.   The risk factors are reflected in the social history.  The roster of all physicians providing medical care to patient - is listed in the Snapshot section of the chart.  Activities of daily living:  The patient is 100% independent in all ADLs: dressing, toileting, feeding as well as independent mobility  Home safety : The patient has smoke detectors in the home. They wear seatbelts.  There are no firearms at home. There is no violence in the home.   There is no risks for hepatitis, STDs or HIV. There is no   history of blood transfusion. They have no travel history to infectious disease endemic areas of the world.  The patient has seen their dentist in the last six month. They have seen their eye doctor in the last year. They admit to slight hearing difficulty with regard to whispered voices and some television programs.  They have deferred audiologic testing in the last year.  They do not  have excessive sun exposure. Discussed the need for sun protection: hats, long sleeves and use of sunscreen if there is significant sun exposure.   Diet: the importance of a healthy diet is discussed. They do have a healthy diet.  The benefits of regular aerobic exercise were discussed. She walks 4 times per week ,  20 minutes.   Depression screen: there are no signs or vegative symptoms of depression- irritability, change in appetite, anhedonia,  sadness/tearfullness.  Cognitive assessment: the patient manages all their financial and personal affairs and is actively engaged. They could relate day,date,year and events; recalled 2/3 objects at 3 minutes; performed clock-face test normally.  The following portions of the patient's history were reviewed and updated as appropriate: allergies, current medications, past family history, past medical history,  past surgical history, past social history  and problem list.  Visual acuity was not assessed per patient preference since she has regular follow up with her ophthalmologist. Hearing and body mass index were assessed and reviewed.   During the course of the visit the patient was educated and counseled about appropriate screening and preventive services including : fall prevention , diabetes screening, nutrition counseling, colorectal cancer screening, and recommended immunizations.    Objective:  BP 108/72  Pulse 64  Temp(Src) 98.2 F (36.8 C) (Oral)  Resp 16  Ht 5' 3.5" (1.613 m)  Wt 169 lb 4 oz (76.771 kg)  BMI 29.51 kg/m2  SpO2 98% General appearance: alert, cooperative and appears stated age Head: Normocephalic, without obvious abnormality, atraumatic Eyes: conjunctivae/corneas clear. PERRL, EOM's intact. Fundi benign. Ears: normal TM's and external ear canals both ears Nose: Nares normal. Septum midline. Mucosa normal. No drainage or sinus tenderness. Throat: lips, mucosa, and tongue normal; teeth and gums normal Neck: no adenopathy, no carotid bruit, no JVD, supple, symmetrical, trachea midline and thyroid not enlarged, symmetric, no tenderness/mass/nodules Lungs: clear to auscultation bilaterally Breasts: normal appearance, no masses or tenderness Heart: regular rate and rhythm, S1,  S2 normal, no murmur, click, rub or gallop Abdomen: soft, non-tender; bowel sounds normal; no masses,  no organomegaly Extremities: extremities normal, atraumatic, no cyanosis or edema Pulses:  2+ and symmetric Skin: Skin color, texture, turgor normal. No rashes or lesions Neurologic: Alert and oriented X 3, normal strength and tone. Normal symmetric reflexes. Normal coordination and gait.   Assessment and plan:  Bladder cystocele Failed pessary trial.  planning to have surgery by Dr. Doy Hutching  Obesity (BMI 30.0-34.9) BMI now below 30.  I have recommended a low glycemic index diet utilizing smaller more frequent meals to increase metabolism.  I have also recommended that patient start exercising with a goal of 30 minutes of aerobic exercise a minimum of 5 days per week. Screening for lipid disorders, thyroid and diabetes was done today and all labs are normal.    Routine general medical examination at a health care facility Annual comprehensive exam was done including breast, excluding pelvic exam.  She is s/p hysterectomy and planning to return to Dr Doy Hutching for surgical correction of cystocele.   All screenings have been addressed .   S/P gastric bypass BMI remains just below 30.  She is happy with her weight , but I have addressed  BMI and recommended wt loss of 10% of body weigh over the next 6 months using a low glycemic index diet and regular exercise a minimum of 5 days per week.    Restless legs syndrome Managed with 2 mg requip, now discontinued by patient due to nausea and concern for drug interactiosn raised by her pharmacy/insurance.  Trial of mirapex ,  Dose can be titrated weekly.   Hepatitis C antibody test positive Repeat testing by Sampson Goon was negative. Patient has no known risk factors.   Elevated liver enzymes negative serologies and ultrasound.  Repeat LFTS normal.  Lab Results  Component Value Date   ALT 19 07/23/2013   AST 18 07/23/2013   ALKPHOS 76 07/23/2013   BILITOT 0.6 07/23/2013      Updated Medication List Outpatient Encounter Prescriptions as of 07/23/2013  Medication Sig  . butalbital-acetaminophen-caffeine (FIORICET WITH CODEINE)  50-325-40-30 MG per capsule Take 1 capsule by mouth every 6 (six) hours as needed.  . calcium-vitamin D (OSCAL) 250-125 MG-UNIT per tablet Take 1 tablet by mouth daily.  . cetirizine (ZYRTEC) 10 MG tablet Take 10 mg by mouth daily.  . DULoxetine (CYMBALTA) 60 MG capsule Take 1 capsule (60 mg total) by mouth daily. NEEDS APPT FOR FURTHER REFILLS  . esomeprazole (NEXIUM) 40 MG capsule Take 1 capsule (40 mg total) by mouth daily before breakfast.  . Multiple Vitamin (MULTIVITAMIN) tablet Take 1 tablet by mouth daily.  . promethazine (PHENERGAN) 25 MG tablet TAKE (1) TABLET BY MOUTH EVERY SIX HOURS AS NEEDED.  . [DISCONTINUED] DULoxetine (CYMBALTA) 60 MG capsule Take 1 capsule (60 mg total) by mouth daily. NEEDS APPT FOR FURTHER REFILLS  . [DISCONTINUED] promethazine (PHENERGAN) 25 MG tablet TAKE (1) TABLET BY MOUTH EVERY SIX HOURS AS NEEDED.  Marland Kitchen pramipexole (MIRAPEX) 0.125 MG tablet Take 1 tablet (0.125 mg total) by mouth 3 (three) times daily.  Marland Kitchen sulfamethoxazole-trimethoprim (SEPTRA DS) 800-160 MG per tablet Take 1 tablet by mouth 2 (two) times daily.  . [DISCONTINUED] rOPINIRole (REQUIP) 0.5 MG tablet Take 1 tablet (0.5 mg total) by mouth at bedtime. 1 tablet up to 3 hours before bedtime.May increase weekly by 1 tablet (2 tablets week 2, 3 tablets week 3 ) up to 4 tablets maximum if needed

## 2013-07-23 NOTE — Assessment & Plan Note (Signed)
negative serologies and ultrasound.  Repeat LFTS normal.  Lab Results  Component Value Date   ALT 19 07/23/2013   AST 18 07/23/2013   ALKPHOS 76 07/23/2013   BILITOT 0.6 07/23/2013

## 2013-07-23 NOTE — Patient Instructions (Addendum)
Trial of mirapex for restless legs  Start with 1 tablet daily  at bedtime,  Increase by 1 tablet every to 1 to 2 weeks until the symptoms are controlled   You had your annual Medicare wellness exam today  We will schedule your mammogram soon.   We will contact you with the bloodwork results  I recommend increasing yoru exercise to 30 mintues of brisk walking 5 days per week

## 2013-07-23 NOTE — Assessment & Plan Note (Signed)
Failed pessary trial.  planning to have surgery by Dr. Doy HutchingWeidner

## 2013-07-24 LAB — VITAMIN D 25 HYDROXY (VIT D DEFICIENCY, FRACTURES): Vit D, 25-Hydroxy: 36 ng/mL (ref 30–89)

## 2013-07-25 ENCOUNTER — Encounter: Payer: Self-pay | Admitting: *Deleted

## 2013-07-28 ENCOUNTER — Encounter: Payer: Self-pay | Admitting: Emergency Medicine

## 2013-08-13 ENCOUNTER — Ambulatory Visit: Payer: Self-pay | Admitting: Internal Medicine

## 2013-08-20 ENCOUNTER — Telehealth: Payer: Self-pay | Admitting: *Deleted

## 2013-08-20 MED ORDER — PRAMIPEXOLE DIHYDROCHLORIDE 0.25 MG PO TABS: 0.2500 mg | ORAL_TABLET | Freq: Every day | ORAL | Status: DC

## 2013-08-20 NOTE — Telephone Encounter (Signed)
Patient was started on Mirapex 0.125 mg for restless legs but says she has to take 2 at night to get relief, patient would like a larger prescription or stronger dose if possible please advise.

## 2013-08-20 NOTE — Telephone Encounter (Signed)
rx for mirapex 0.25 mg sent to [facility]

## 2013-08-21 NOTE — Telephone Encounter (Signed)
Patient notified

## 2013-08-26 ENCOUNTER — Ambulatory Visit: Payer: Self-pay | Admitting: Internal Medicine

## 2013-09-16 ENCOUNTER — Other Ambulatory Visit: Payer: Self-pay | Admitting: Internal Medicine

## 2013-09-17 ENCOUNTER — Encounter: Payer: Self-pay | Admitting: Internal Medicine

## 2013-09-17 ENCOUNTER — Other Ambulatory Visit: Payer: Self-pay | Admitting: Internal Medicine

## 2013-10-13 ENCOUNTER — Other Ambulatory Visit: Payer: Self-pay | Admitting: *Deleted

## 2013-10-13 NOTE — Telephone Encounter (Signed)
Last appt 07/23/13, ok refill?

## 2013-10-14 MED ORDER — BUTALBITAL-APAP-CAFF-COD 50-325-40-30 MG PO CAPS: 1.0000 | ORAL_CAPSULE | Freq: Four times a day (QID) | ORAL | Status: DC | PRN

## 2013-10-14 NOTE — Telephone Encounter (Signed)
Ok to refill,  printed rx  

## 2013-12-16 ENCOUNTER — Other Ambulatory Visit: Payer: Self-pay | Admitting: Internal Medicine

## 2013-12-21 ENCOUNTER — Other Ambulatory Visit: Payer: Self-pay | Admitting: Internal Medicine

## 2014-02-26 HISTORY — PX: JOINT REPLACEMENT: SHX530

## 2014-04-18 ENCOUNTER — Other Ambulatory Visit: Payer: Self-pay | Admitting: Internal Medicine

## 2014-04-30 ENCOUNTER — Telehealth: Payer: Self-pay | Admitting: Internal Medicine

## 2014-04-30 MED ORDER — DULOXETINE HCL 60 MG PO CPEP: ORAL_CAPSULE | ORAL | Status: DC

## 2014-04-30 NOTE — Telephone Encounter (Signed)
Patient called for refill on Cymbalta ok to fill? Last fill 12/15

## 2014-04-30 NOTE — Telephone Encounter (Signed)
Ok to refill,  Refill sent  

## 2014-05-04 ENCOUNTER — Other Ambulatory Visit: Payer: Self-pay | Admitting: Internal Medicine

## 2014-06-05 ENCOUNTER — Telehealth: Payer: Self-pay | Admitting: Internal Medicine

## 2014-06-05 MED ORDER — PROMETHAZINE HCL 25 MG PO TABS: ORAL_TABLET | ORAL | Status: DC

## 2014-06-05 NOTE — Telephone Encounter (Signed)
Why does she take this?  Does she take it regularly.  Is she having acute symptoms?  If just wanting refill because a low - then hold for Dr Darrick Huntsmanullo to see if continue prescribing.

## 2014-06-05 NOTE — Telephone Encounter (Signed)
If this is something that she gives her to have intermittently - ok to refill x 1

## 2014-06-05 NOTE — Telephone Encounter (Signed)
Refill sent.

## 2014-06-05 NOTE — Telephone Encounter (Signed)
Patient called asking for refill on promethazine ok to fill?

## 2014-06-05 NOTE — Telephone Encounter (Signed)
Patient had gastric bypass and at times food makes her have Nausea and that is when she takes the medication ok to fill?

## 2014-07-10 ENCOUNTER — Other Ambulatory Visit: Payer: Self-pay | Admitting: Internal Medicine

## 2014-07-13 NOTE — Telephone Encounter (Signed)
Okay to refill? Next appt on 07/27/14

## 2014-07-14 NOTE — Telephone Encounter (Signed)
Ok to refill,  printed rx  

## 2014-07-25 ENCOUNTER — Other Ambulatory Visit: Payer: Self-pay | Admitting: Internal Medicine

## 2014-07-27 ENCOUNTER — Ambulatory Visit (INDEPENDENT_AMBULATORY_CARE_PROVIDER_SITE_OTHER): Payer: Medicare Other | Admitting: Internal Medicine

## 2014-07-27 ENCOUNTER — Encounter: Payer: Self-pay | Admitting: Internal Medicine

## 2014-07-27 VITALS — BP 112/78 | HR 68 | Temp 98.7°F | Resp 16 | Ht 63.5 in | Wt 175.8 lb

## 2014-07-27 DIAGNOSIS — Z96653 Presence of artificial knee joint, bilateral: Secondary | ICD-10-CM

## 2014-07-27 DIAGNOSIS — Z13820 Encounter for screening for osteoporosis: Secondary | ICD-10-CM

## 2014-07-27 DIAGNOSIS — Z23 Encounter for immunization: Secondary | ICD-10-CM | POA: Diagnosis not present

## 2014-07-27 DIAGNOSIS — R894 Abnormal immunological findings in specimens from other organs, systems and tissues: Secondary | ICD-10-CM

## 2014-07-27 DIAGNOSIS — Z1239 Encounter for other screening for malignant neoplasm of breast: Secondary | ICD-10-CM | POA: Diagnosis not present

## 2014-07-27 DIAGNOSIS — R5383 Other fatigue: Secondary | ICD-10-CM | POA: Diagnosis not present

## 2014-07-27 DIAGNOSIS — R768 Other specified abnormal immunological findings in serum: Secondary | ICD-10-CM

## 2014-07-27 DIAGNOSIS — R748 Abnormal levels of other serum enzymes: Secondary | ICD-10-CM

## 2014-07-27 DIAGNOSIS — R7301 Impaired fasting glucose: Secondary | ICD-10-CM

## 2014-07-27 DIAGNOSIS — Z0000 Encounter for general adult medical examination without abnormal findings: Secondary | ICD-10-CM

## 2014-07-27 DIAGNOSIS — Z148 Genetic carrier of other disease: Secondary | ICD-10-CM

## 2014-07-27 DIAGNOSIS — Z9884 Bariatric surgery status: Secondary | ICD-10-CM

## 2014-07-27 DIAGNOSIS — E669 Obesity, unspecified: Secondary | ICD-10-CM

## 2014-07-27 DIAGNOSIS — M8580 Other specified disorders of bone density and structure, unspecified site: Secondary | ICD-10-CM

## 2014-07-27 DIAGNOSIS — E559 Vitamin D deficiency, unspecified: Secondary | ICD-10-CM | POA: Diagnosis not present

## 2014-07-27 LAB — TSH: TSH: 1.24 u[IU]/mL (ref 0.35–4.50)

## 2014-07-27 LAB — COMPREHENSIVE METABOLIC PANEL
ALT: 13 U/L (ref 0–35)
AST: 17 U/L (ref 0–37)
Albumin: 4.2 g/dL (ref 3.5–5.2)
Alkaline Phosphatase: 114 U/L (ref 39–117)
BUN: 19 mg/dL (ref 6–23)
CALCIUM: 9.9 mg/dL (ref 8.4–10.5)
CHLORIDE: 105 meq/L (ref 96–112)
CO2: 28 mEq/L (ref 19–32)
Creatinine, Ser: 0.75 mg/dL (ref 0.40–1.20)
GFR: 82.16 mL/min (ref >60.00)
Glucose, Bld: 111 mg/dL — ABNORMAL HIGH (ref 70–99)
Potassium: 4.8 mEq/L (ref 3.5–5.1)
Sodium: 139 mEq/L (ref 135–145)
Total Bilirubin: 0.6 mg/dL (ref 0.2–1.2)
Total Protein: 7.7 g/dL (ref 6.0–8.3)

## 2014-07-27 LAB — CBC WITH DIFFERENTIAL/PLATELET
BASOS PCT: 0.7 % (ref 0.0–3.0)
Basophils Absolute: 0 10*3/uL (ref 0.0–0.1)
EOS PCT: 5.2 % — AB (ref 0.0–5.0)
Eosinophils Absolute: 0.2 10*3/uL (ref 0.0–0.7)
HCT: 39.5 % (ref 36.0–46.0)
Hemoglobin: 13.4 g/dL (ref 12.0–15.0)
LYMPHS PCT: 43.5 % (ref 12.0–46.0)
Lymphs Abs: 2 10*3/uL (ref 0.7–4.0)
MCHC: 34 g/dL (ref 30.0–36.0)
MCV: 95.5 fl (ref 78.0–100.0)
MONO ABS: 0.3 10*3/uL (ref 0.1–1.0)
MONOS PCT: 7.4 % (ref 3.0–12.0)
NEUTROS PCT: 43.2 % (ref 43.0–77.0)
Neutro Abs: 2 10*3/uL (ref 1.4–7.7)
PLATELETS: 240 10*3/uL (ref 150.0–400.0)
RBC: 4.14 Mil/uL (ref 3.87–5.11)
RDW: 13.4 % (ref 11.5–15.5)
WBC: 4.6 10*3/uL (ref 4.0–10.5)

## 2014-07-27 LAB — LIPID PANEL
CHOL/HDL RATIO: 3
Cholesterol: 160 mg/dL (ref 0–200)
HDL: 61.6 mg/dL (ref >39.00)
LDL Cholesterol: 74 mg/dL (ref 0–99)
NONHDL: 98.4
Triglycerides: 122 mg/dL (ref 0.0–149.0)
VLDL: 24.4 mg/dL (ref 0.0–40.0)

## 2014-07-27 LAB — VITAMIN D 25 HYDROXY (VIT D DEFICIENCY, FRACTURES): VITD: 29.67 ng/mL — ABNORMAL LOW (ref 30.00–100.00)

## 2014-07-27 MED ORDER — PROMETHAZINE HCL 25 MG PO TABS: ORAL_TABLET | ORAL | Status: DC

## 2014-07-27 NOTE — Telephone Encounter (Signed)
Appt today

## 2014-07-27 NOTE — Patient Instructions (Signed)

## 2014-07-27 NOTE — Progress Notes (Signed)
Patient ID: Erin Aguilar, female   DOB: September 18, 1950, 64 y.o.   MRN: [id number]  The patient is here for annual Medicare wellness examination and management of other chronic and acute problems.    Last seen April  2015.  Since then she has undergone simultaneous bilateral Knee replacements , done  November 2015, by Dr. Valinda Hoar at [facility] .  Usin gfoioricet  prn pain,  Had itching with oxycodone,  trying to walk   3-4 times per week .  Active daily.  Has been taking care of adult nephews who have Down's syndrome occasionally, with husband for her sister. Marland Kitchen  .S/p hysterectomy  Sister has NASH and hereditary hemochromatosis,  Patient has been screenedin the past. Iron stores and liver enzymes  were normal last year. Brother died of cirrhosis secondary to fatty liver, not sure if screened for [facility]   Fasting today    The risk factors are reflected in the social history.  The roster of all physicians providing medical care to patient - is listed in the Snapshot section of the chart.  Activities of daily living:  The patient is 100% independent in all ADLs: dressing, toileting, feeding as well as independent mobility  Home safety : The patient has smoke detectors in the home. They wear seatbelts.  There are no firearms at home. There is no violence in the home.   There is no risks for hepatitis, STDs or HIV. There is no   history of blood transfusion. They have no travel history to infectious disease endemic areas of the world.  The patient has seen their dentist in the last six month. They have seen their eye doctor in the last year. They admit to slight hearing difficulty with regard to whispered voices and some television programs.  They have deferred audiologic testing in the last year.  They do not  have excessive sun exposure. Discussed the need for sun protection: hats, long sleeves and use of sunscreen if there is significant sun exposure.   Diet: the importance of a healthy  diet is discussed. They do have a healthy diet.  The benefits of regular aerobic exercise were discussed. She walks 4 times per week ,  20 minutes.   Depression screen: there are no signs or vegative symptoms of depression- irritability, change in appetite, anhedonia, sadness/tearfullness.  Cognitive assessment: the patient manages all their financial and personal affairs and is actively engaged. They could relate day,date,year and events; recalled 2/3 objects at 3 minutes; performed clock-face test normally.  The following portions of the patient's history were reviewed and updated as appropriate: allergies, current medications, past family history, past medical history,  past surgical history, past social history  and problem list.  Visual acuity was not assessed per patient preference since she has regular follow up with her ophthalmologist. Hearing and body mass index were assessed and reviewed.   During the course of the visit the patient was educated and counseled about appropriate screening and preventive services including : fall prevention , diabetes screening, nutrition counseling, colorectal cancer screening, and recommended immunizations.    Review of Systems:  Patient denies headache, fevers, malaise, unintentional weight loss, skin rash, eye pain, sinus congestion and sinus pain, sore throat, dysphagia,  hemoptysis , cough, dyspnea, wheezing, chest pain, palpitations, orthopnea, edema, abdominal pain, nausea, melena, diarrhea, constipation, flank pain, dysuria, hematuria, urinary  Frequency, nocturia, numbness, tingling, seizures,  Focal weakness, Loss of consciousness,  Tremor, insomnia, depression, anxiety, and suicidal ideation.  Objective:  BP 112/78 mmHg  Pulse 68  Temp(Src) 98.7 F (37.1 C) (Oral)  Resp 16  Ht 5' 3.5" (1.613 m)  Wt 175 lb 12 oz (79.72 kg)  BMI 30.64 kg/m2  SpO2 98%  General appearance: alert, cooperative and appears stated age Head:  Normocephalic, without obvious abnormality, atraumatic Eyes: conjunctivae/corneas clear. PERRL, EOM's intact. Fundi benign. Ears: normal TM's and external ear canals both ears Nose: Nares normal. Septum midline. Mucosa normal. No drainage or sinus tenderness. Throat: lips, mucosa, and tongue normal; teeth and gums normal Neck: no adenopathy, no carotid bruit, no JVD, supple, symmetrical, trachea midline and thyroid not enlarged, symmetric, no tenderness/mass/nodules Lungs: clear to auscultation bilaterally Breasts: normal appearance, no masses or tenderness Heart: regular rate and rhythm, S1, S2 normal, no murmur, click, rub or gallop Abdomen: soft, non-tender; bowel sounds normal; no masses,  no organomegaly Extremities: extremities normal, atraumatic, no cyanosis or edema Pulses: 2+ and symmetric Skin: Bilateral patellar surgical incisions, well healed.  color, texture, turgor normal. No rashes or lesions Neurologic: Alert and oriented X 3, normal strength and tone. Normal symmetric reflexes. Normal coordination and gait.   Assessment and plan:  Problem List Items Addressed This Visit      Unprioritized   Hemochromatosis carrier    negative serologies for iron overload were done in 2014 as well as an ultrasound.  Repeat LFTS normal.  Lab Results  Component Value Date   ALT 13 07/27/2014   AST 17 07/27/2014   ALKPHOS 114 07/27/2014   BILITOT 0.6 07/27/2014    Lab Results  Component Value Date   IRON 125 07/15/2012   TIBC 291 07/15/2012   FERRITIN 65.8 07/15/2012         S/P bilateral unicompartmental knee replacement    She hs improved mobility and decreased pain,  Encouraged  regular exercise to restore body BMI < 30 and maintain muscle tone.       Obesity (BMI 30.0-34.9)    She is S/p gastric bypass Dec 2011 for high of  230 lbs.   I have addressed  BMI and recommended wt loss of 10% of body weight over the next 6 months using a low glycemic index diet and regular  exercise a minimum of 5 days per week.        Routine general medical examination at a health care facility    Annual Medicare wellness  exam was done as well as a comprehensive physical exam and management of acute and chronic conditions .  During the course of the visit the patient was educated and counseled about appropriate screening and preventive services including : fall prevention , diabetes screening, nutrition counseling, colorectal cancer screening, and recommended immunizations.  Printed recommendations for health maintenance screenings was given.       Hepatitis C antibody test positive    Repeat testing was negative per ID      Osteopenia    Discussed the current controversies surrounding the risks and benefits of calcium supplementation.  Encouraged her to increase dietary calcium through natural foods including almond/coconut milk.  Repeat DEXA ordered      Elevated liver enzymes    negative serologies for autoimmune hepatitis were done last year as well as abdominal ultrasound,  FH of NASH and cryptogenic cirrhosis noted.   Repeat LFTS normal.  Lab Results  Component Value Date   ALT 13 07/27/2014   AST 17 07/27/2014   ALKPHOS 114 07/27/2014   BILITOT 0.6 07/27/2014  S/P gastric bypass    With episodic  nausea occurring every few weeks, related to meals.  managed with prn phenergan .  LFTS and Vit D nornal today       Impaired fasting glucose    Will repeat in 3 months with A1c,  Low gi diet and regular exercise with gall wt loss of 10% recommended        Other Visit Diagnoses    Vitamin D deficiency    -  Primary    Relevant Orders    Vit D  25 hydroxy (rtn osteoporosis monitoring) (Completed)    Morbid obesity        Breast cancer screening        Relevant Orders    MM DIGITAL SCREENING BILATERAL    Screening for osteoporosis        Relevant Orders    DG Bone Density    Obesity        Relevant Orders    Comprehensive metabolic panel  (Completed)    Lipid panel (Completed)    Other fatigue        Relevant Orders    CBC with Differential/Platelet (Completed)    TSH (Completed)    Need for prophylactic vaccination against Streptococcus pneumoniae (pneumococcus)        Relevant Orders    Pneumococcal polysaccharide vaccine 23-valent greater than or equal to 2yo subcutaneous/IM (Completed)

## 2014-07-28 DIAGNOSIS — R7301 Impaired fasting glucose: Secondary | ICD-10-CM | POA: Insufficient documentation

## 2014-07-28 NOTE — Assessment & Plan Note (Signed)

## 2014-07-28 NOTE — Assessment & Plan Note (Signed)
Will repeat in 3 months with A1c,  Low gi diet and regular exercise with gall wt loss of 10% recommended

## 2014-07-28 NOTE — Assessment & Plan Note (Signed)
She hs improved mobility and decreased pain,  Encouraged  regular exercise to restore body BMI < 30 and maintain muscle tone.

## 2014-07-28 NOTE — Assessment & Plan Note (Signed)
She is S/p gastric bypass Dec 2011 for high of  230 lbs.   I have addressed  BMI and recommended wt loss of 10% of body weight over the next 6 months using a low glycemic index diet and regular exercise a minimum of 5 days per week.

## 2014-07-28 NOTE — Assessment & Plan Note (Signed)
negative serologies for iron overload were done in 2014 as well as an ultrasound.  Repeat LFTS normal.  Lab Results  Component Value Date   ALT 13 07/27/2014   AST 17 07/27/2014   ALKPHOS 114 07/27/2014   BILITOT 0.6 07/27/2014    Lab Results  Component Value Date   IRON 125 07/15/2012   TIBC 291 07/15/2012   FERRITIN 65.8 07/15/2012

## 2014-07-28 NOTE — Assessment & Plan Note (Signed)
negative serologies for autoimmune hepatitis were done last year as well as abdominal ultrasound,  FH of NASH and cryptogenic cirrhosis noted.   Repeat LFTS normal.  Lab Results  Component Value Date   ALT 13 07/27/2014   AST 17 07/27/2014   ALKPHOS 114 07/27/2014   BILITOT 0.6 07/27/2014

## 2014-07-28 NOTE — Assessment & Plan Note (Signed)
With episodic  nausea occurring every few weeks, related to meals.  managed with prn phenergan .  LFTS and Vit D nornal today

## 2014-07-28 NOTE — Assessment & Plan Note (Signed)
Repeat testing was negative per ID

## 2014-07-28 NOTE — Assessment & Plan Note (Addendum)
Discussed the current controversies surrounding the risks and benefits of calcium supplementation.  Encouraged her to increase dietary calcium through natural foods including almond/coconut milk.  Repeat DEXA ordered

## 2014-07-31 ENCOUNTER — Encounter: Payer: Self-pay | Admitting: *Deleted

## 2014-09-02 ENCOUNTER — Other Ambulatory Visit: Payer: Medicare Other

## 2014-09-02 ENCOUNTER — Ambulatory Visit
Admission: RE | Admit: 2014-09-02 | Discharge: 2014-09-02 | Disposition: A | Discharge status: Home / Self Care | Payer: Medicare Other | Source: Ambulatory Visit | Attending: Internal Medicine | Admitting: Internal Medicine

## 2014-09-02 DIAGNOSIS — Z1239 Encounter for other screening for malignant neoplasm of breast: Secondary | ICD-10-CM

## 2014-09-15 ENCOUNTER — Ambulatory Visit: Admission: RE | Admit: 2014-09-15 | Payer: Medicare Other | Source: Ambulatory Visit

## 2014-09-15 ENCOUNTER — Other Ambulatory Visit: Payer: Medicare Other

## 2014-09-23 ENCOUNTER — Ambulatory Visit
Admission: RE | Admit: 2014-09-23 | Discharge: 2014-09-23 | Disposition: A | Discharge status: Home / Self Care | Payer: Medicare Other | Source: Ambulatory Visit | Attending: Internal Medicine | Admitting: Internal Medicine

## 2014-09-23 ENCOUNTER — Other Ambulatory Visit: Payer: Self-pay | Admitting: Internal Medicine

## 2014-09-23 DIAGNOSIS — Z13820 Encounter for screening for osteoporosis: Secondary | ICD-10-CM | POA: Diagnosis present

## 2014-09-23 DIAGNOSIS — Z1231 Encounter for screening mammogram for malignant neoplasm of breast: Secondary | ICD-10-CM | POA: Diagnosis present

## 2014-09-23 DIAGNOSIS — M810 Age-related osteoporosis without current pathological fracture: Secondary | ICD-10-CM | POA: Diagnosis not present

## 2014-09-23 DIAGNOSIS — Z1239 Encounter for other screening for malignant neoplasm of breast: Secondary | ICD-10-CM

## 2014-10-14 ENCOUNTER — Ambulatory Visit: Payer: Medicare Other | Admitting: Internal Medicine

## 2014-10-15 ENCOUNTER — Ambulatory Visit (INDEPENDENT_AMBULATORY_CARE_PROVIDER_SITE_OTHER): Payer: Medicare Other | Admitting: Internal Medicine

## 2014-10-15 ENCOUNTER — Encounter: Payer: Self-pay | Admitting: Internal Medicine

## 2014-10-15 ENCOUNTER — Encounter (INDEPENDENT_AMBULATORY_CARE_PROVIDER_SITE_OTHER): Payer: Self-pay

## 2014-10-15 VITALS — BP 108/74 | HR 84 | Temp 98.4°F | Resp 16 | Ht 64.0 in | Wt 178.5 lb

## 2014-10-15 DIAGNOSIS — M810 Age-related osteoporosis without current pathological fracture: Secondary | ICD-10-CM | POA: Diagnosis not present

## 2014-10-15 MED ORDER — ALENDRONATE SODIUM 70 MG PO TABS: 70.0000 mg | ORAL_TABLET | ORAL | Status: DC

## 2014-10-15 NOTE — Progress Notes (Signed)
Subjective:  Patient ID: Erin Aguilar, female    DOB: 01/27/1951  Age: 64 y.o. MRN: [id number]  CC: The encounter diagnosis was Osteoporosis.  HPI Erin Aguilar presents for DISCUSSION OF  BONE DENSITY results done recently. She has no history of fragility fractures,  Exercises occasionally. .  Dietary intake of calcium reviewed. Family history reviewed.    Outpatient Prescriptions Prior to Visit  Medication Sig Dispense Refill  . butalbital-acetaminophen-caffeine (FIORICET WITH CODEINE) 50-325-40-30 MG per capsule TAKE 1 CAPSULE BY MOUTH EVERY 6 HOURS AS NEEDED 60 capsule 1  . calcium-vitamin D (OSCAL) 250-125 MG-UNIT per tablet Take 1 tablet by mouth daily.    . cetirizine (ZYRTEC) 10 MG tablet Take 10 mg by mouth daily.    . Cholecalciferol (VITAMIN D3) 2000 UNITS capsule Take 2,000 Units by mouth daily.     . DULoxetine (CYMBALTA) 60 MG capsule TAKE (1) CAPSULE BY MOUTH ONCE DAILY. 30 capsule 2  . esomeprazole (NEXIUM) 40 MG capsule Take 1 capsule (40 mg total) by mouth daily before breakfast. 30 capsule 6  . Multiple Vitamin (MULTIVITAMIN) tablet Take 1 tablet by mouth daily.    . pramipexole (MIRAPEX) 0.25 MG tablet TAKE 1 TABLET BY MOUTH ONCE DAILY AT 10 P.M. 30 tablet 5  . promethazine (PHENERGAN) 25 MG tablet TAKE (1) TABLET BY MOUTH EVERY SIX HOURS AS NEEDED. 30 tablet 3   No facility-administered medications prior to visit.    Review of Systems;  Patient denies headache, fevers, malaise, unintentional weight loss, skin rash, eye pain, sinus congestion and sinus pain, sore throat, dysphagia,  hemoptysis , cough, dyspnea, wheezing, chest pain, palpitations, orthopnea, edema, abdominal pain, nausea, melena, diarrhea, constipation, flank pain, dysuria, hematuria, urinary  Frequency, nocturia, numbness, tingling, seizures,  Focal weakness, Loss of consciousness,  Tremor, insomnia, depression, anxiety, and suicidal ideation.      Objective:  BP 108/74 mmHg  Pulse 84   Temp(Src) 98.4 F (36.9 C) (Oral)  Resp 16  Ht 5\' 4"  (1.626 m)  Wt 178 lb 8 oz (80.967 kg)  BMI 30.62 kg/m2  SpO2 97%  BP Readings from Last 3 Encounters:  10/15/14 108/74  07/27/14 112/78  07/23/13 108/72    Wt Readings from Last 3 Encounters:  10/15/14 178 lb 8 oz (80.967 kg)  07/27/14 175 lb 12 oz (79.72 kg)  07/23/13 169 lb 4 oz (76.771 kg)    General appearance: alert, cooperative and appears stated age Ears: normal TM's and external ear canals both ears Throat: lips, mucosa, and tongue normal; teeth and gums normal Neck: no adenopathy, no carotid bruit, supple, symmetrical, trachea midline and thyroid not enlarged, symmetric, no tenderness/mass/nodules Back: symmetric, no curvature. ROM normal. No CVA tenderness. Lungs: clear to auscultation bilaterally Heart: regular rate and rhythm, S1, S2 normal, no murmur, click, rub or gallop Abdomen: soft, non-tender; bowel sounds normal; no masses,  no organomegaly Pulses: 2+ and symmetric Skin: Skin color, texture, turgor normal. No rashes or lesions Lymph nodes: Cervical, supraclavicular, and axillary nodes normal.  Lab Results  Component Value Date   HGBA1C 5.8 07/23/2013    Lab Results  Component Value Date   CREATININE 0.75 07/27/2014   CREATININE 0.6 07/23/2013   CREATININE 0.7 06/10/2012    Lab Results  Component Value Date   WBC 4.6 07/27/2014   HGB 13.4 07/27/2014   HCT 39.5 07/27/2014   PLT 240.0 07/27/2014   GLUCOSE 111* 07/27/2014   CHOL 160 07/27/2014   TRIG 122.0 07/27/2014  HDL 61.60 07/27/2014   LDLDIRECT 106.0 06/10/2012   LDLCALC 74 07/27/2014   ALT 13 07/27/2014   AST 17 07/27/2014   NA 139 07/27/2014   K 4.8 07/27/2014   CL 105 07/27/2014   CREATININE 0.75 07/27/2014   BUN 19 07/27/2014   CO2 28 07/27/2014   TSH 1.24 07/27/2014   HGBA1C 5.8 07/23/2013    Dg Bone Density  09/23/2014   EXAM: DUAL X-RAY ABSORPTIOMETRY (DXA) FOR BONE MINERAL DENSITY  IMPRESSION: Dear Dr Darrick Huntsmanullo,  Your  patient Chrissie NoaBrenda Stelmach completed a BMD test on 09/23/2014 using the Lunar iDXA DXA System (analysis version: 14.10) manufactured by [employer]. The following summarizes the results of our evaluation.  PATIENT BIOGRAPHICAL: Name: Erin Meyerowell, Lonni A Patient ID: [id number] Birth Date: 1951-03-15 Height: 62.0 in. Gender: Female Exam Date: 09/23/2014 Weight: 173.9 lbs. Indications: Caucasian, Height Loss, Hysterectomy, Postmenopausal Fractures: Treatments: CALCIUM VIT D, nexium  ASSESSMENT:  The BMD measured at AP Spine L1-L3 is 0.817 g/cm2 with a T-score of -2.9. This patient is considered osteoporotic according to World Health Organization [facility](WHO) criteria. Site Region Measured Measured WHO Young Adult BMD Date       Age      Classification T-score 0.803 g/cm2 AP Spine L1-L3 09/23/2014 66.2 Osteoporosis -2.9 0.817 g/cm2 AP Spine L1-L3 06/13/2012 63.9 Osteopenia -1.9 0.944 g/cm2  DualFemur Neck Left 09/23/2014 66.2 Osteopenia -2.1 0.741 g/cm2 DualFemur Neck Left 06/13/2012 63.9 Osteopenia -1.4 0.843 g/cm2  World Health Organization [facility](WHO) criteria for post-menopausal, Caucasian Women: Normal:       T-score at or above -1 SD Osteopenia:   T-score between -1 and -2.5 SD Osteoporosis: T-score at or below -2.5 SD  RECOMMENDATIONS: National Osteoporosis Foundation recommends that FDA-approved medical therapies be considered in postmenopausal women and men age 66 or older with a: 1. Hip or vertebral (clinical or morphometric) fracture. 2. T-score of < -2.5 at the spine or hip. 3. Ten-year fracture probability by FRAX of 3% or greater for hip fracture or 20% or greater for major osteoporotic fracture.  All treatment decisions require clinical judgment and consideration of individual patient factors, including patient preferences, co-morbidities, previous drug use, risk factors not captured in the FRAX model (e.g. falls, vitamin D deficiency, increased bone turnover, interval significant decline in bone density) and possible under  - or over-estimation of fracture risk by FRAX.  All patients should ensure an adequate intake of dietary calcium (1200 mg/d) and vitamin D (800 IU daily) unless contraindicated.  FOLLOW-UP: People with diagnosed cases of osteoporosis or at high risk for fracture should have regular bone mineral density tests. For patients eligible for Medicare, routine testing is allowed once every 2 years. The testing frequency can be increased to one year for patients who have rapidly progressing disease, those who are receiving or discontinuing medical therapy to restore bone mass, or have additional risk factors.  I have reviewed this report, and agree with the above findings.  [facility]   Electronically Signed   By: David  SwazilandJordan M.D.   On: 09/23/2014 15:16   Mm Screening Breast Tomo Bilateral  09/24/2014   CLINICAL DATA:  Screening.  EXAM: DIGITAL SCREENING BILATERAL MAMMOGRAM WITH 3D TOMO WITH CAD  COMPARISON:  Previous exam(s).  ACR Breast Density Category b: There are scattered areas of fibroglandular density.  FINDINGS: There are no findings suspicious for malignancy. Images were processed with CAD.  IMPRESSION: No mammographic evidence of malignancy. A result letter of this screening mammogram will be mailed directly to the  patient.  RECOMMENDATION: Screening mammogram in one year. (Code:[id number]-[id number])  BI-RADS CATEGORY  1: Negative.   Electronically Signed   By: Dalphine Handing M.D.   On: 09/24/2014 08:04    Assessment & Plan:   Problem List Items Addressed This Visit      Unprioritized   Osteoporosis - Primary    She has had a progressive loss of bone density with T scores now  -2.9 SPINE 2016 DEXA . Reviewed medication choices,  Calcium and Vitamin D requirements.  Alendronate prescribed.       Relevant Medications   alendronate (FOSAMAX) 70 MG tablet      I am having Ms. Denson start on alendronate. I am also having her maintain her multivitamin, cetirizine, calcium-vitamin D, esomeprazole,  pramipexole, butalbital-acetaminophen-caffeine, DULoxetine, Vitamin D3, and promethazine.  Meds ordered this encounter  Medications  . alendronate (FOSAMAX) 70 MG tablet    Sig: Take 1 tablet (70 mg total) by mouth once a week. Take with a full glass of water on an empty stomach.    Dispense:  12 tablet    Refill:  2    90 day supply   A total of 25 minutes of face to face time was spent with patient more than half of which was spent in counselling and coordination of care   There are no discontinued medications.  Follow-up: Return in about 6 months (around 04/16/2015).   Sherlene Shams, MD

## 2014-10-15 NOTE — Patient Instructions (Addendum)
YOU NEED 1800 MG CALCIUM AND 2000  IUS OF VIT 3D DAILY SINCE YOUR BONES HAVE OSTEOPOROSIS  I recommend getting the majority of your calcium and Vitamin D  through diet rather than supplements given the recent association of calcium supplements with increased coronary artery calcium scores  Try the almond and cashew milks that most grocery stores  now carry  in the dairy  Section.   They are lactose free:  [employer] brand Almond Light,  Original formula.  Delicious,  Low carb,  Low cal,  Cholesterol free   I am prescribing alendronate to take weekly for your bones   We will repeat the DEXA in 2 years  Brooke DareJJ Smith , 10 day Green Smoothie Detox Diet  ([employer])

## 2014-10-15 NOTE — Progress Notes (Signed)
Pre-visit discussion using our clinic review tool. No additional management support is needed unless otherwise documented below in the visit note.  

## 2014-10-15 NOTE — Assessment & Plan Note (Addendum)
She has had a progressive loss of bone density with T scores now  -2.9 SPINE 2016 DEXA . Reviewed medication choices,  Calcium and Vitamin D requirements.  Alendronate prescribed.

## 2014-12-14 ENCOUNTER — Encounter (INDEPENDENT_AMBULATORY_CARE_PROVIDER_SITE_OTHER): Payer: Self-pay

## 2014-12-14 ENCOUNTER — Encounter: Payer: Self-pay | Admitting: Internal Medicine

## 2014-12-14 ENCOUNTER — Ambulatory Visit (INDEPENDENT_AMBULATORY_CARE_PROVIDER_SITE_OTHER): Payer: Medicare Other | Admitting: Internal Medicine

## 2014-12-14 VITALS — BP 112/70 | HR 70 | Temp 98.3°F | Resp 14 | Ht 63.0 in | Wt 176.0 lb

## 2014-12-14 DIAGNOSIS — Z23 Encounter for immunization: Secondary | ICD-10-CM | POA: Diagnosis not present

## 2014-12-14 DIAGNOSIS — G894 Chronic pain syndrome: Secondary | ICD-10-CM

## 2014-12-14 DIAGNOSIS — R739 Hyperglycemia, unspecified: Secondary | ICD-10-CM

## 2014-12-14 DIAGNOSIS — F411 Generalized anxiety disorder: Secondary | ICD-10-CM | POA: Diagnosis not present

## 2014-12-14 MED ORDER — ESCITALOPRAM OXALATE 10 MG PO TABS: 10.0000 mg | ORAL_TABLET | Freq: Every day | ORAL | Status: DC

## 2014-12-14 MED ORDER — DULOXETINE HCL 30 MG PO CPEP: 60.0000 mg | ORAL_CAPSULE | Freq: Every day | ORAL | Status: DC

## 2014-12-14 MED ORDER — ETODOLAC ER 400 MG PO TB24: 400.0000 mg | ORAL_TABLET | Freq: Every day | ORAL | Status: DC

## 2014-12-14 NOTE — Progress Notes (Signed)
Subjective:  Patient ID: Erin Aguilar, female    DOB: 03-22-1951  Age: 64 y.o. MRN: [id number]  CC: The primary encounter diagnosis was Encounter for immunization. Diagnoses of Hyperglycemia, Anxiety state, and Chronic pain syndrome were also pertinent to this visit.  HPI Erin Aguilar presents for treatment of anxiety.   symptoms include lack of patience ,  irrritability .  Directed toward husband who is losing his hearing . Recently had the resposibility of caring for nephews with Down's syndrome for a week in [place] so her sister could make a trip. Found the week exhuasting     Has been taking cymbalta for years for management of pain and depression but has persistent pain from arthritis.  Taking tylenol once or twice daily ,  occaisonal advil .   Outpatient Prescriptions Prior to Visit  Medication Sig Dispense Refill  . alendronate (FOSAMAX) 70 MG tablet Take 1 tablet (70 mg total) by mouth once a week. Take with a full glass of water on an empty stomach. 12 tablet 2  . butalbital-acetaminophen-caffeine (FIORICET WITH CODEINE) 50-325-40-30 MG per capsule TAKE 1 CAPSULE BY MOUTH EVERY 6 HOURS AS NEEDED 60 capsule 1  . calcium-vitamin D (OSCAL) 250-125 MG-UNIT per tablet Take 1 tablet by mouth daily.    . cetirizine (ZYRTEC) 10 MG tablet Take 10 mg by mouth daily.    . Cholecalciferol (VITAMIN D3) 2000 UNITS capsule Take 2,000 Units by mouth daily.     Marland Kitchen esomeprazole (NEXIUM) 40 MG capsule Take 1 capsule (40 mg total) by mouth daily before breakfast. 30 capsule 6  . Multiple Vitamin (MULTIVITAMIN) tablet Take 1 tablet by mouth daily.    . pramipexole (MIRAPEX) 0.25 MG tablet TAKE 1 TABLET BY MOUTH ONCE DAILY AT 10 P.M. 30 tablet 5  . promethazine (PHENERGAN) 25 MG tablet TAKE (1) TABLET BY MOUTH EVERY SIX HOURS AS NEEDED. 30 tablet 3  . DULoxetine (CYMBALTA) 60 MG capsule TAKE (1) CAPSULE BY MOUTH ONCE DAILY. 30 capsule 2   No facility-administered medications prior to visit.      Review of Systems;  Patient denies headache, fevers, malaise, unintentional weight loss, skin rash, eye pain, sinus congestion and sinus pain, sore throat, dysphagia,  hemoptysis , cough, dyspnea, wheezing, chest pain, palpitations, orthopnea, edema, abdominal pain, nausea, melena, diarrhea, constipation, flank pain, dysuria, hematuria, urinary  Frequency, nocturia, numbness, tingling, seizures,  Focal weakness, Loss of consciousness,  Tremor, insomnia, depression, anxiety, and suicidal ideation.      Objective:  BP 112/70 mmHg  Pulse 70  Temp(Src) 98.3 F (36.8 C) (Oral)  Resp 14  Ht  (1.6 m)  Wt 176 lb (79.833 kg)  BMI 31.18 kg/m2  SpO2 97%  BP Readings from Last 3 Encounters:  12/14/14 112/70  10/15/14 108/74  07/27/14 112/78    Wt Readings from Last 3 Encounters:  12/14/14 176 lb (79.833 kg)  10/15/14 178 lb 8 oz (80.967 kg)  07/27/14 175 lb 12 oz (79.72 kg)    General appearance: alert, cooperative and appears stated age Ears: normal TM's and external ear canals both ears Throat: lips, mucosa, and tongue normal; teeth and gums normal Neck: no adenopathy, no carotid bruit, supple, symmetrical, trachea midline and thyroid not enlarged, symmetric, no tenderness/mass/nodules Back: symmetric, no curvature. ROM normal. No CVA tenderness. Lungs: clear to auscultation bilaterally Heart: regular rate and rhythm, S1, S2 normal, no murmur, click, rub or gallop Abdomen: soft, non-tender; bowel sounds normal; no masses,  no organomegaly  Pulses: 2+ and symmetric Skin: Skin color, texture, turgor normal. No rashes or lesions Lymph nodes: Cervical, supraclavicular, and axillary nodes normal.  Lab Results  Component Value Date   HGBA1C 5.7 12/14/2014   HGBA1C 5.8 07/23/2013    Lab Results  Component Value Date   CREATININE 0.75 07/27/2014   CREATININE 0.6 07/23/2013   CREATININE 0.7 06/10/2012    Lab Results  Component Value Date   WBC 4.6 07/27/2014   HGB 13.4  07/27/2014   HCT 39.5 07/27/2014   PLT 240.0 07/27/2014   GLUCOSE 111* 07/27/2014   CHOL 160 07/27/2014   TRIG 122.0 07/27/2014   HDL 61.60 07/27/2014   LDLDIRECT 106.0 06/10/2012   LDLCALC 74 07/27/2014   ALT 13 07/27/2014   AST 17 07/27/2014   NA 139 07/27/2014   K 4.8 07/27/2014   CL 105 07/27/2014   CREATININE 0.75 07/27/2014   BUN 19 07/27/2014   CO2 28 07/27/2014   TSH 1.24 07/27/2014   HGBA1C 5.7 12/14/2014    Dg Bone Density  09/23/2014   EXAM: DUAL X-RAY ABSORPTIOMETRY (DXA) FOR BONE MINERAL DENSITY  IMPRESSION: Dear Dr Darrick Huntsman,  [name] completed a BMD test on 09/23/2014 using the Lunar iDXA DXA System (analysis version: 14.10) manufactured by [employer]. The following summarizes the results of our evaluation.  PATIENT BIOGRAPHICAL: [name] Patient ID: [id number] Birth Date: 1950-05-16 Height: 62.0 in. Gender: Female Exam Date: 09/23/2014 Weight: 173.9 lbs. Indications: Caucasian, Height Loss, Hysterectomy, Postmenopausal Fractures: Treatments: CALCIUM VIT D, nexium  ASSESSMENT:  The BMD measured at AP Spine L1-L3 is 0.817 g/cm2 with a T-score of -2.9. This patient is considered osteoporotic according to World Health Organization [facility]) criteria. Site Region Measured Measured WHO Young Adult BMD Date       Age      Classification T-score 0.803 g/cm2 AP Spine L1-L3 09/23/2014 66.2 Osteoporosis -2.9 0.817 g/cm2 AP Spine L1-L3 06/13/2012 63.9 Osteopenia -1.9 0.944 g/cm2  DualFemur Neck Left 09/23/2014 66.2 Osteopenia -2.1 0.741 g/cm2 DualFemur Neck Left 06/13/2012 63.9 Osteopenia -1.4 0.843 g/cm2  World Health Organization [facility]) criteria for post-menopausal, Caucasian Women: Normal:       T-score at or above -1 SD Osteopenia:   T-score between -1 and -2.5 SD Osteoporosis: T-score at or below -2.5 SD  RECOMMENDATIONS: National Osteoporosis Foundation recommends that FDA-approved medical therapies be considered in postmenopausal women and men age 70 or older  with a: 1. Hip or vertebral (clinical or morphometric) fracture. 2. T-score of < -2.5 at the spine or hip. 3. Ten-year fracture probability by FRAX of 3% or greater for hip fracture or 20% or greater for major osteoporotic fracture.  All treatment decisions require clinical judgment and consideration of individual patient factors, including patient preferences, co-morbidities, previous drug use, risk factors not captured in the FRAX model (e.g. falls, vitamin D deficiency, increased bone turnover, interval significant decline in bone density) and possible under - or over-estimation of fracture risk by FRAX.  All patients should ensure an adequate intake of dietary calcium (1200 mg/d) and vitamin D (800 IU daily) unless contraindicated.  FOLLOW-UP: People with diagnosed cases of osteoporosis or at high risk for fracture should have regular bone mineral density tests. For patients eligible for Medicare, routine testing is allowed once every 2 years. The testing frequency can be increased to one year for patients who have rapidly progressing disease, those who are receiving or discontinuing medical therapy to restore bone mass, or have additional risk factors.  I have reviewed this report, and agree with the above findings.  [facility] Radiology   Electronically Signed   By: David  [place] M.D.   On: 09/23/2014 15:16   Mm Screening Breast Tomo Bilateral  09/24/2014   CLINICAL DATA:  Screening.  EXAM: DIGITAL SCREENING BILATERAL MAMMOGRAM WITH 3D TOMO WITH CAD  COMPARISON:  Previous exam(s).  ACR Breast Density Category b: There are scattered areas of fibroglandular density.  FINDINGS: There are no findings suspicious for malignancy. Images were processed with CAD.  IMPRESSION: No mammographic evidence of malignancy. A result letter of this screening mammogram will be mailed directly to the patient.  RECOMMENDATION: Screening mammogram in one year. (Code:SM-B-01Y)  BI-RADS CATEGORY  1: Negative.   Electronically Signed    By: Dalphine Handing M.D.   On: 09/24/2014 08:04    Assessment & Plan:   Problem List Items Addressed This Visit      Unprioritized   Anxiety state    Long discussion about source of anxiety,  Appears to be untreated pain leading to irritability and impatience.  Advised to avoid use of benzodiazepines.  trial of lexapro and address pain control.       Relevant Medications   escitalopram (LEXAPRO) 10 MG tablet   DULoxetine (CYMBALTA) 30 MG capsule   Chronic pain syndrome    Secondary to osteoarthritis and joint pain. Trial of t  ylenoland lodine XL.  Will wean off cymbata over 3 week  period once lexapro has been titrated to full dose        Other Visit Diagnoses    Encounter for immunization    -  Primary    Hyperglycemia        Relevant Orders    Hemoglobin A1c (Completed)      A total of 25 minutes of face to face time was spent with patient more than half of which was spent in counselling about the above mentioned conditions  and coordination of care   I have changed Ms. Leasure's DULoxetine. I am also having her start on etodolac and escitalopram. Additionally, I am having her maintain her multivitamin, cetirizine, calcium-vitamin D, esomeprazole, pramipexole, butalbital-acetaminophen-caffeine, Vitamin D3, promethazine, and alendronate.  Meds ordered this encounter  Medications  . etodolac (LODINE XL) 400 MG 24 hr tablet    Sig: Take 1 tablet (400 mg total) by mouth daily.    Dispense:  30 tablet    Refill:  3  . escitalopram (LEXAPRO) 10 MG tablet    Sig: Take 1 tablet (10 mg total) by mouth daily.    Dispense:  30 tablet    Refill:  1  . DULoxetine (CYMBALTA) 30 MG capsule    Sig: Take 2 capsules (60 mg total) by mouth daily. For 2 weeks,  Then begin the taper as directed    Dispense:  30 capsule    Refill:  0    Medications Discontinued During This Encounter  Medication Reason  . DULoxetine (CYMBALTA) 60 MG capsule Reorder    Follow-up: Return in about 4 weeks  (around 01/11/2015).   Sherlene Shams, MD

## 2014-12-14 NOTE — Patient Instructions (Addendum)
Let's try treating your pain followed by a transition form cymabalta to lexapro   Adding Lodine XL one  Tablet daily instead of Adbil  Continue tylenol up to 2000 mg daily   If pain is still not controlled on this regimen,   let me know  We will start Lexapro as a medication for depression/generalized anxiety    Start with the 1/2 tablet daily in the evening for the first week anxiety   We will start to wean the cymbalta  The beginning Week 3 of Lexapro  .  Beginning with Week 3,  Reduce your Cymbalta dose to 30 mg daily for one week,  Then 30 mg every other day for one week , then 30 mg every 2 days for one week,  then stop ,

## 2014-12-14 NOTE — Progress Notes (Signed)
Pre-visit discussion using our clinic review tool. No additional management support is needed unless otherwise documented below in the visit note.  

## 2014-12-15 DIAGNOSIS — G894 Chronic pain syndrome: Secondary | ICD-10-CM | POA: Insufficient documentation

## 2014-12-15 DIAGNOSIS — F411 Generalized anxiety disorder: Secondary | ICD-10-CM | POA: Insufficient documentation

## 2014-12-15 LAB — HEMOGLOBIN A1C: Hgb A1c MFr Bld: 5.7 % (ref 4.6–6.5)

## 2014-12-15 NOTE — Assessment & Plan Note (Signed)
Secondary to osteoarthritis and joint pain. Trial of t  ylenoland lodine XL.  Will wean off cymbata over 3 week  period once lexapro has been titrated to full dose

## 2014-12-15 NOTE — Assessment & Plan Note (Signed)
Long discussion about source of anxiety,  Appears to be untreated pain leading to irritability and impatience.  Advised to avoid use of benzodiazepines.  trial of lexapro and address pain control.

## 2014-12-17 ENCOUNTER — Encounter: Payer: Self-pay | Admitting: *Deleted

## 2015-01-11 ENCOUNTER — Ambulatory Visit (INDEPENDENT_AMBULATORY_CARE_PROVIDER_SITE_OTHER): Payer: Medicare Other | Admitting: Internal Medicine

## 2015-01-11 ENCOUNTER — Encounter: Payer: Self-pay | Admitting: Internal Medicine

## 2015-01-11 VITALS — BP 116/80 | HR 67 | Temp 98.2°F | Resp 12 | Ht 63.0 in | Wt 178.5 lb

## 2015-01-11 DIAGNOSIS — R7301 Impaired fasting glucose: Secondary | ICD-10-CM | POA: Diagnosis not present

## 2015-01-11 DIAGNOSIS — G894 Chronic pain syndrome: Secondary | ICD-10-CM | POA: Diagnosis not present

## 2015-01-11 DIAGNOSIS — Z79899 Other long term (current) drug therapy: Secondary | ICD-10-CM | POA: Diagnosis not present

## 2015-01-11 MED ORDER — BUTALBITAL-APAP-CAFF-COD 50-325-40-30 MG PO CAPS: ORAL_CAPSULE | ORAL | Status: DC

## 2015-01-11 MED ORDER — DULOXETINE HCL 20 MG PO CPEP: 20.0000 mg | ORAL_CAPSULE | Freq: Every day | ORAL | Status: DC

## 2015-01-11 NOTE — Assessment & Plan Note (Addendum)
Her pain and anxiety were optimally managed  when cymbalta was used daily  At 20 mg and lexapro at 10 mg daily .  Lodine did provide relief , discontinuation advised.

## 2015-01-11 NOTE — Assessment & Plan Note (Signed)
Advised to continue low gi diet and regular exercise with goal weight loss of 10% recommended   Lab Results  Component Value Date   HGBA1C 5.7 12/14/2014

## 2015-01-11 NOTE — Progress Notes (Signed)
Subjective:  Patient ID: Erin Aguilar, female    DOB: December 29, 1950  Age: 64 y.o. MRN: [id number]  CC: The primary encounter diagnosis was Long-term use of high-risk medication. Diagnoses of Chronic pain syndrome and Impaired fasting glucose were also pertinent to this visit.  HPI Erin Aguilar presents for one month follow up on anxiety and chronic pain. At her last visit, lexapro was started along with lodine xl.  cymbalta was weaned off over a 3 week period.  She reports that she has more pain currently without the cymbalta and the lodine has not made any improvement to her pain .   During the cross taper of lexapro 10 mg daily and cymbalta 20 mg  daily felt better   Has used the fioricet as needed .  Outpatient Prescriptions Prior to Visit  Medication Sig Dispense Refill  . alendronate (FOSAMAX) 70 MG tablet Take 1 tablet (70 mg total) by mouth once a week. Take with a full glass of water on an empty stomach. 12 tablet 2  . calcium-vitamin D (OSCAL) 250-125 MG-UNIT per tablet Take 1 tablet by mouth daily.    . cetirizine (ZYRTEC) 10 MG tablet Take 10 mg by mouth daily.    . Cholecalciferol (VITAMIN D3) 2000 UNITS capsule Take 2,000 Units by mouth daily.     Marland Kitchen escitalopram (LEXAPRO) 10 MG tablet Take 1 tablet (10 mg total) by mouth daily. 30 tablet 1  . esomeprazole (NEXIUM) 40 MG capsule Take 1 capsule (40 mg total) by mouth daily before breakfast. 30 capsule 6  . Multiple Vitamin (MULTIVITAMIN) tablet Take 1 tablet by mouth daily.    . pramipexole (MIRAPEX) 0.25 MG tablet TAKE 1 TABLET BY MOUTH ONCE DAILY AT 10 P.M. 30 tablet 5  . promethazine (PHENERGAN) 25 MG tablet TAKE (1) TABLET BY MOUTH EVERY SIX HOURS AS NEEDED. 30 tablet 3  . butalbital-acetaminophen-caffeine (FIORICET WITH CODEINE) 50-325-40-30 MG per capsule TAKE 1 CAPSULE BY MOUTH EVERY 6 HOURS AS NEEDED 60 capsule 1  . DULoxetine (CYMBALTA) 30 MG capsule Take 2 capsules (60 mg total) by mouth daily. For 2 weeks,  Then  begin the taper as directed 30 capsule 0  . etodolac (LODINE XL) 400 MG 24 hr tablet Take 1 tablet (400 mg total) by mouth daily. 30 tablet 3   No facility-administered medications prior to visit.    Review of Systems;  Patient denies headache, fevers, malaise, unintentional weight loss, skin rash, eye pain, sinus congestion and sinus pain, sore throat, dysphagia,  hemoptysis , cough, dyspnea, wheezing, chest pain, palpitations, orthopnea, edema, abdominal pain, nausea, melena, diarrhea, constipation, flank pain, dysuria, hematuria, urinary  Frequency, nocturia, numbness, tingling, seizures,  Focal weakness, Loss of consciousness,  Tremor, insomnia, depression, anxiety, and suicidal ideation.      Objective:  BP 116/80 mmHg  Pulse 67  Temp(Src) 98.2 F (36.8 C) (Oral)  Resp 12  Ht  (1.6 m)  Wt 178 lb 8 oz (80.967 kg)  BMI 31.63 kg/m2  SpO2 97%  BP Readings from Last 3 Encounters:  01/11/15 116/80  12/14/14 112/70  10/15/14 108/74    Wt Readings from Last 3 Encounters:  01/11/15 178 lb 8 oz (80.967 kg)  12/14/14 176 lb (79.833 kg)  10/15/14 178 lb 8 oz (80.967 kg)    General appearance: alert, cooperative and appears stated age Ears: normal TM's and external ear canals both ears Throat: lips, mucosa, and tongue normal; teeth and gums normal Neck: no adenopathy, no carotid  bruit, supple, symmetrical, trachea midline and thyroid not enlarged, symmetric, no tenderness/mass/nodules Back: symmetric, no curvature. ROM normal. No CVA tenderness. Lungs: clear to auscultation bilaterally Heart: regular rate and rhythm, S1, S2 normal, no murmur, click, rub or gallop Abdomen: soft, non-tender; bowel sounds normal; no masses,  no organomegaly Pulses: 2+ and symmetric Skin: Skin color, texture, turgor normal. No rashes or lesions Lymph nodes: Cervical, supraclavicular, and axillary nodes normal.  Lab Results  Component Value Date   HGBA1C 5.7 12/14/2014   HGBA1C 5.8  07/23/2013    Lab Results  Component Value Date   CREATININE 0.75 07/27/2014   CREATININE 0.6 07/23/2013   CREATININE 0.7 06/10/2012    Lab Results  Component Value Date   WBC 4.6 07/27/2014   HGB 13.4 07/27/2014   HCT 39.5 07/27/2014   PLT 240.0 07/27/2014   GLUCOSE 111* 07/27/2014   CHOL 160 07/27/2014   TRIG 122.0 07/27/2014   HDL 61.60 07/27/2014   LDLDIRECT 106.0 06/10/2012   LDLCALC 74 07/27/2014   ALT 13 07/27/2014   AST 17 07/27/2014   NA 139 07/27/2014   K 4.8 07/27/2014   CL 105 07/27/2014   CREATININE 0.75 07/27/2014   BUN 19 07/27/2014   CO2 28 07/27/2014   TSH 1.24 07/27/2014   HGBA1C 5.7 12/14/2014    Dg Bone Density  09/23/2014   EXAM: DUAL X-RAY ABSORPTIOMETRY (DXA) FOR BONE MINERAL DENSITY  IMPRESSION: Dear Dr Erin Aguilar,  [name] completed a BMD test on 09/23/2014 using the Lunar iDXA DXA System (analysis version: 14.10) manufactured by [employer]. The following summarizes the results of our evaluation.  PATIENT BIOGRAPHICAL: Name: Erin, Aguilar Patient ID: [id number] Birth Date: Sep 08, 1950 Height: 62.0 in. Gender: Female Exam Date: 09/23/2014 Weight: 173.9 lbs. Indications: Caucasian, Height Loss, Hysterectomy, Postmenopausal Fractures: Treatments: CALCIUM VIT D, nexium  ASSESSMENT:  The BMD measured at AP Spine L1-L3 is 0.817 g/cm2 with a T-score of -2.9. This patient is considered osteoporotic according to World Health Organization [facility]) criteria. Site Region Measured Measured WHO Young Adult BMD Date       Age      Classification T-score 0.803 g/cm2 AP Spine L1-L3 09/23/2014 66.2 Osteoporosis -2.9 0.817 g/cm2 AP Spine L1-L3 06/13/2012 63.9 Osteopenia -1.9 0.944 g/cm2  DualFemur Neck Left 09/23/2014 66.2 Osteopenia -2.1 0.741 g/cm2 DualFemur Neck Left 06/13/2012 63.9 Osteopenia -1.4 0.843 g/cm2  World Health Organization [facility]) criteria for post-menopausal, Caucasian Women: Normal:       T-score at or above -1 SD Osteopenia:   T-score between  -1 and -2.5 SD Osteoporosis: T-score at or below -2.5 SD  RECOMMENDATIONS: National Osteoporosis Foundation recommends that FDA-approved medical therapies be considered in postmenopausal women and men age 29 or older with a: 1. Hip or vertebral (clinical or morphometric) fracture. 2. T-score of < -2.5 at the spine or hip. 3. Ten-year fracture probability by FRAX of 3% or greater for hip fracture or 20% or greater for major osteoporotic fracture.  All treatment decisions require clinical judgment and consideration of individual patient factors, including patient preferences, co-morbidities, previous drug use, risk factors not captured in the FRAX model (e.g. falls, vitamin D deficiency, increased bone turnover, interval significant decline in bone density) and possible under - or over-estimation of fracture risk by FRAX.  All patients should ensure an adequate intake of dietary calcium (1200 mg/d) and vitamin D (800 IU daily) unless contraindicated.  FOLLOW-UP: People with diagnosed cases of osteoporosis or at high risk for fracture should have  regular bone mineral density tests. For patients eligible for Medicare, routine testing is allowed once every 2 years. The testing frequency can be increased to one year for patients who have rapidly progressing disease, those who are receiving or discontinuing medical therapy to restore bone mass, or have additional risk factors.  I have reviewed this report, and agree with the above findings.  [facility]   Electronically Signed   By: David  [place] M.D.   On: 09/23/2014 15:16   Mm Screening Breast Tomo Bilateral  09/24/2014   CLINICAL DATA:  Screening.  EXAM: DIGITAL SCREENING BILATERAL MAMMOGRAM WITH 3D TOMO WITH CAD  COMPARISON:  Previous exam(s).  ACR Breast Density Category b: There are scattered areas of fibroglandular density.  FINDINGS: There are no findings suspicious for malignancy. Images were processed with CAD.  IMPRESSION: No mammographic evidence of  malignancy. A result letter of this screening mammogram will be mailed directly to the patient.  RECOMMENDATION: Screening mammogram in one year. (Code:SM-B-01Y)  BI-RADS CATEGORY  1: Negative.   Electronically Signed   By: Dalphine Handing M.D.   On: 09/24/2014 08:04    Assessment & Plan:   Problem List Items Addressed This Visit    Impaired fasting glucose    Advised to continue low gi diet and regular exercise with goal weight loss of 10% recommended   Lab Results  Component Value Date   HGBA1C 5.7 12/14/2014         Chronic pain syndrome    Her pain and anxiety were optimally managed  when cymbalta was used daily  At 20 mg and lexapro at 10 mg daily .  Lodine did provide relief , discontinuation advised.        Other Visit Diagnoses    Long-term use of high-risk medication    -  Primary    Relevant Orders    Comprehensive metabolic panel       I have discontinued Ms. Vivanco's etodolac. I have also changed her DULoxetine and butalbital-acetaminophen-caffeine. Additionally, I am having her maintain her multivitamin, cetirizine, calcium-vitamin D, esomeprazole, pramipexole, Vitamin D3, promethazine, alendronate, and escitalopram.  Meds ordered this encounter  Medications  . DULoxetine (CYMBALTA) 20 MG capsule    Sig: Take 1 capsule (20 mg total) by mouth daily. For 2 weeks,  Then begin the taper as directed    Dispense:  90 capsule    Refill:  1  . butalbital-acetaminophen-caffeine (FIORICET WITH CODEINE) 50-325-40-30 MG per capsule    Sig: TAKE 1 CAPSULE BY MOUTH EVERY 6 HOURS AS NEEDED for pain    Dispense:  90 capsule    Refill:  3    Medications Discontinued During This Encounter  Medication Reason  . DULoxetine (CYMBALTA) 30 MG capsule Reorder  . etodolac (LODINE XL) 400 MG 24 hr tablet   . butalbital-acetaminophen-caffeine (FIORICET WITH CODEINE) 50-325-40-30 MG per capsule Reorder    Follow-up: No Follow-up on file.   Sherlene Shams, MD

## 2015-01-11 NOTE — Patient Instructions (Signed)
You can resume the cymbalta at a low dose of 20 mg daily,  And continue the lexapro at 10 mg  Daily   You can use fioricet and motrin for your joint pain  Return in November for blood test/sodium level (does not require fasting)  Return to see me in 6 months

## 2015-01-11 NOTE — Progress Notes (Signed)
Pre-visit discussion using our clinic review tool. No additional management support is needed unless otherwise documented below in the visit note.  

## 2015-01-14 ENCOUNTER — Other Ambulatory Visit: Payer: Self-pay | Admitting: Internal Medicine

## 2015-02-03 ENCOUNTER — Other Ambulatory Visit: Payer: Medicare Other

## 2015-02-05 ENCOUNTER — Ambulatory Visit: Payer: Medicare Other | Admitting: Internal Medicine

## 2015-02-10 ENCOUNTER — Other Ambulatory Visit (INDEPENDENT_AMBULATORY_CARE_PROVIDER_SITE_OTHER): Payer: Medicare Other

## 2015-02-10 DIAGNOSIS — Z79899 Other long term (current) drug therapy: Secondary | ICD-10-CM

## 2015-02-10 LAB — COMPREHENSIVE METABOLIC PANEL
ALT: 14 U/L (ref 0–35)
AST: 21 U/L (ref 0–37)
Albumin: 4.1 g/dL (ref 3.5–5.2)
Alkaline Phosphatase: 100 U/L (ref 39–117)
BUN: 12 mg/dL (ref 6–23)
CALCIUM: 9.3 mg/dL (ref 8.4–10.5)
CO2: 25 meq/L (ref 19–32)
CREATININE: 0.73 mg/dL (ref 0.40–1.20)
Chloride: 108 mEq/L (ref 96–112)
GFR: 84.62 mL/min (ref >60.00)
GLUCOSE: 96 mg/dL (ref 70–99)
Potassium: 4.5 mEq/L (ref 3.5–5.1)
Sodium: 140 mEq/L (ref 135–145)
Total Bilirubin: 0.4 mg/dL (ref 0.2–1.2)
Total Protein: 7.1 g/dL (ref 6.0–8.3)

## 2015-02-16 ENCOUNTER — Other Ambulatory Visit: Payer: Self-pay | Admitting: Internal Medicine

## 2015-02-26 ENCOUNTER — Other Ambulatory Visit: Payer: Medicare Other

## 2015-04-20 ENCOUNTER — Other Ambulatory Visit: Payer: Self-pay | Admitting: Internal Medicine

## 2015-04-23 ENCOUNTER — Encounter: Payer: Self-pay | Admitting: Internal Medicine

## 2015-04-23 ENCOUNTER — Ambulatory Visit (INDEPENDENT_AMBULATORY_CARE_PROVIDER_SITE_OTHER): Payer: Medicare Other | Admitting: Internal Medicine

## 2015-04-23 VITALS — BP 120/78 | HR 62 | Temp 98.2°F | Resp 12 | Ht 63.0 in | Wt 178.0 lb

## 2015-04-23 DIAGNOSIS — G5603 Carpal tunnel syndrome, bilateral upper limbs: Secondary | ICD-10-CM

## 2015-04-23 DIAGNOSIS — G5600 Carpal tunnel syndrome, unspecified upper limb: Secondary | ICD-10-CM

## 2015-04-23 MED ORDER — MELOXICAM 15 MG PO TABS: 15.0000 mg | ORAL_TABLET | Freq: Every day | ORAL | Status: DC

## 2015-04-23 NOTE — Patient Instructions (Signed)
I am prescribing meloxicam, an anti inflammatory ,  To take once daily with food  You can continue to take tylenol up to 2000 mg daily,  But not Aelve or Motrin.  I think you may have Carpal tunnel syndrome affecting possibly both wrists  I would like you to wear the wrist splints all night and during your stained glass class  EMG nerve conduction studies have been ordered  Carpal Tunnel Syndrome Carpal tunnel syndrome is a condition that causes pain in your hand and arm. The carpal tunnel is a narrow area located on the palm side of your wrist. Repeated wrist motion or certain diseases may cause swelling within the tunnel. This swelling pinches the main nerve in the wrist (median nerve). CAUSES  This condition may be caused by:   Repeated wrist motions.  Wrist injuries.  Arthritis.  A cyst or tumor in the carpal tunnel.  Fluid buildup during pregnancy. Sometimes the cause of this condition is not known.  RISK FACTORS This condition is more likely to develop in:   People who have jobs that cause them to repeatedly move their wrists in the same motion, such as butchers and cashiers.  Women.  People with certain conditions, such as:  Diabetes.  Obesity.  An underactive thyroid (hypothyroidism).  Kidney failure. SYMPTOMS  Symptoms of this condition include:   A tingling feeling in your fingers, especially in your thumb, index, and middle fingers.  Tingling or numbness in your hand.  An aching feeling in your entire arm, especially when your wrist and elbow are bent for long periods of time.  Wrist pain that goes up your arm to your shoulder.  Pain that goes down into your palm or fingers.  A weak feeling in your hands. You may have trouble grabbing and holding items. Your symptoms may feel worse during the night.  DIAGNOSIS  This condition is diagnosed with a medical history and physical exam. You may also have tests, including:   An electromyogram (EMG). This  test measures electrical signals sent by your nerves into the muscles.  X-rays. TREATMENT  Treatment for this condition includes:  Lifestyle changes. It is important to stop doing or modify the activity that caused your condition.  Physical or occupational therapy.  Medicines for pain and inflammation. This may include medicine that is injected into your wrist.  A wrist splint.  Surgery. HOME CARE INSTRUCTIONS  If You Have a Splint:  Wear it as told by your health care provider. Remove it only as told by your health care provider.  Loosen the splint if your fingers become numb and tingle, or if they turn cold and blue.  Keep the splint clean and dry. General Instructions  Take over-the-counter and prescription medicines only as told by your health care provider.  Rest your wrist from any activity that may be causing your pain. If your condition is work related, talk to your employer about changes that can be made, such as getting a wrist pad to use while typing.  If directed, apply ice to the painful area:  Put ice in a plastic bag.  Place a towel between your skin and the bag.  Leave the ice on for 20 minutes, 2-3 times per day.  Keep all follow-up visits as told by your health care provider. This is important.  Do any exercises as told by your health care provider, physical therapist, or occupational therapist. SEEK MEDICAL CARE IF:   You have new symptoms.  Your  pain is not controlled with medicines.  Your symptoms get worse.   This information is not intended to replace advice given to you by your health care provider. Make sure you discuss any questions you have with your health care provider.   Document Released: 03/31/2000 Document Revised: 12/23/2014 Document Reviewed: 08/19/2014 Elsevier Interactive Patient Education [employer]2016 Elsevier Inc.

## 2015-04-23 NOTE — Progress Notes (Signed)
Pre-visit discussion using our clinic review tool. No additional management support is needed unless otherwise documented below in the visit note.  

## 2015-04-23 NOTE — Progress Notes (Signed)
Subjective:  Patient ID: Erin Aguilar, female    DOB: Jan 23, 1951  Age: 65 y.o. MRN: [id number]  CC: There were no encounter diagnoses.  HPI Erin Aguilar presents for new onset neuropathy affecting both arms.   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  The activity includes pushing on the glass to grind it under the grinder which causes her to hyperperextend both wrists.  In the last two weeks she has developed tingling that radiates from the 3rd and 4th fingers all the way up to the shoulder . The sensation is worse on the left and is aggravated by sleeping on her left side.  She has not done any stained glass work in the last week , but is scheduled to resume the class next week .     Outpatient Prescriptions Prior to Visit  Medication Sig Dispense Refill  . alendronate (FOSAMAX) 70 MG tablet Take 1 tablet (70 mg total) by mouth once Aguilar week. Take with Aguilar full glass of water on an empty stomach. 12 tablet 2  . butalbital-acetaminophen-caffeine (FIORICET WITH CODEINE) 50-325-40-30 MG per capsule TAKE 1 CAPSULE BY MOUTH EVERY 6 HOURS AS NEEDED for pain 90 capsule 3  . calcium-vitamin D (OSCAL) 250-125 MG-UNIT per tablet Take 1 tablet by mouth daily.    . cetirizine (ZYRTEC) 10 MG tablet Take 10 mg by mouth daily.    . Cholecalciferol (VITAMIN D3) 2000 UNITS capsule Take 2,000 Units by mouth daily.     . DULoxetine (CYMBALTA) 20 MG capsule Take 1 capsule (20 mg total) by mouth daily. For 2 weeks,  Then begin the taper as directed 90 capsule 1  . escitalopram (LEXAPRO) 10 MG tablet TAKE 1 TABLET BY MOUTH ONCE DAILY. 30 tablet 0  . esomeprazole (NEXIUM) 40 MG capsule Take 1 capsule (40 mg total) by mouth daily before breakfast. 30 capsule 6  . Multiple Vitamin (MULTIVITAMIN) tablet Take 1 tablet by mouth daily.    . pramipexole (MIRAPEX) 0.25 MG tablet TAKE 1 TABLET BY MOUTH ONCE DAILY AT 10 P.M. 30 tablet 5  . promethazine  (PHENERGAN) 25 MG tablet TAKE (1) TABLET BY MOUTH EVERY SIX HOURS AS NEEDED. 30 tablet 3   No facility-administered medications prior to visit.    Review of Systems;  Patient denies headache, fevers, malaise, unintentional weight loss, skin rash, eye pain, sinus congestion and sinus pain, sore throat, dysphagia,  hemoptysis , cough, dyspnea, wheezing, chest pain, palpitations, orthopnea, edema, abdominal pain, nausea, melena, diarrhea, constipation, flank pain, dysuria, hematuria, urinary  Frequency, nocturia, numbness, tingling, seizures,  Focal weakness, Loss of consciousness,  Tremor, insomnia, depression, anxiety, and suicidal ideation.      Objective:  BP 120/78 mmHg  Pulse 62  Temp(Src) 98.2 F (36.8 C) (Oral)  Resp 12  Ht 5\' 3"  (1.6 m)  Wt 178 lb (80.74 kg)  BMI 31.54 kg/m2  SpO2 99%  BP Readings from Last 3 Encounters:  04/23/15 120/78  01/11/15 116/80  12/14/14 112/70    Wt Readings from Last 3 Encounters:  04/23/15 178 lb (80.74 kg)  01/11/15 178 lb 8 oz (80.967 kg)  12/14/14 176 lb (79.833 kg)    General appearance: alert, cooperative and appears stated age Ears: normal TM's and external ear canals both ears Throat: lips, mucosa, and tongue normal; teeth and gums normal Neck: no adenopathy, no carotid bruit, supple, symmetrical, trachea midline and thyroid not enlarged, symmetric, no  tenderness/mass/nodules Back: symmetric, no curvature. ROM normal. No CVA tenderness. Lungs: clear to auscultation bilaterally Heart: regular rate and rhythm, S1, S2 normal, no murmur, click, rub or gallop Abdomen: soft, non-tender; bowel sounds normal; no masses,  no organomegaly Pulses: 2+ and symmetric Skin: Skin color, texture, turgor normal. No rashes or lesions Lymph nodes: Cervical, supraclavicular, and axillary nodes normal. Neuro: Positive phalen's  Sign and tinel's signs,  left worse than right    Lab Results  Component Value Date   HGBA1C 5.7 12/14/2014   HGBA1C  5.8 07/23/2013    Lab Results  Component Value Date   CREATININE 0.73 02/10/2015   CREATININE 0.75 07/27/2014   CREATININE 0.6 07/23/2013    Lab Results  Component Value Date   WBC 4.6 07/27/2014   HGB 13.4 07/27/2014   HCT 39.5 07/27/2014   PLT 240.0 07/27/2014   GLUCOSE 96 02/10/2015   CHOL 160 07/27/2014   TRIG 122.0 07/27/2014   HDL 61.60 07/27/2014   LDLDIRECT 106.0 06/10/2012   LDLCALC 74 07/27/2014   ALT 14 02/10/2015   AST 21 02/10/2015   NA 140 02/10/2015   K 4.5 02/10/2015   CL 108 02/10/2015   CREATININE 0.73 02/10/2015   BUN 12 02/10/2015   CO2 25 02/10/2015   TSH 1.24 07/27/2014   HGBA1C 5.7 12/14/2014    Dg Bone Density  09/23/2014  EXAM: DUAL X-RAY ABSORPTIOMETRY (DXA) FOR BONE MINERAL DENSITY IMPRESSION: Dear Dr Darrick Huntsman, [name] completed Aguilar BMD test on 09/23/2014 using the Lunar iDXA DXA System (analysis version: 14.10) manufactured by [employer]. The following summarizes the results of our evaluation. PATIENT BIOGRAPHICAL: [name], Erin Aguilar Patient ID: [id number] Birth Date: Jul 18, 1950 Height: 62.0 in. Gender: Female Exam Date: 09/23/2014 Weight: 173.9 lbs. Indications: Caucasian, Height Loss, Hysterectomy, Postmenopausal Fractures: Treatments: CALCIUM VIT D, nexium ASSESSMENT: The BMD measured at AP Spine L1-L3 is 0.817 g/cm2 with Aguilar T-score of -2.9. This patient is considered osteoporotic according to World Health Organization [facility]) criteria. Site Region Measured Measured WHO Young Adult BMD Date       Age      Classification T-score 0.803 g/cm2 AP Spine L1-L3 09/23/2014 66.2 Osteoporosis -2.9 0.817 g/cm2 AP Spine L1-L3 06/13/2012 63.9 Osteopenia -1.9 0.944 g/cm2 DualFemur Neck Left 09/23/2014 66.2 Osteopenia -2.1 0.741 g/cm2 DualFemur Neck Left 06/13/2012 63.9 Osteopenia -1.4 0.843 g/cm2 World Health Organization [facility]) criteria for post-menopausal, Caucasian Women: Normal:       T-score at or above -1 SD Osteopenia:   T-score between -1 and  -2.5 SD Osteoporosis: T-score at or below -2.5 SD RECOMMENDATIONS: National Osteoporosis Foundation recommends that FDA-approved medical therapies be considered in postmenopausal women and men age 74 or older with Aguilar: 1. Hip or vertebral (clinical or morphometric) fracture. 2. T-score of < -2.5 at the spine or hip. 3. Ten-year fracture probability by FRAX of 3% or greater for hip fracture or 20% or greater for major osteoporotic fracture. All treatment decisions require clinical judgment and consideration of individual patient factors, including patient preferences, co-morbidities, previous drug use, risk factors not captured in the FRAX model (e.g. falls, vitamin D deficiency, increased bone turnover, interval significant decline in bone density) and possible under - or over-estimation of fracture risk by FRAX. All patients should ensure an adequate intake of dietary calcium (1200 mg/d) and vitamin D (800 IU daily) unless contraindicated. FOLLOW-UP: People with diagnosed cases of osteoporosis or at high risk for fracture should have regular bone mineral density tests. For patients eligible  for Medicare, routine testing is allowed once every 2 years. The testing frequency can be increased to one year for patients who have rapidly progressing disease, those who are receiving or discontinuing medical therapy to restore bone mass, or have additional risk factors. I have reviewed this report, and agree with the above findings. [facility] Electronically Signed   By: David  [place] M.D.   On: 09/23/2014 15:16   Mm Screening Breast Tomo Bilateral  09/24/2014  CLINICAL DATA:  Screening. EXAM: DIGITAL SCREENING BILATERAL MAMMOGRAM WITH 3D TOMO WITH CAD COMPARISON:  Previous exam(s). ACR Breast Density Category b: There are scattered areas of fibroglandular density. FINDINGS: There are no findings suspicious for malignancy. Images were processed with CAD. IMPRESSION: No mammographic evidence of malignancy. Aguilar result  letter of this screening mammogram will be mailed directly to the patient. RECOMMENDATION: Screening mammogram in one year. (Code:SM-B-01Y) BI-RADS CATEGORY  1: Negative. Electronically Signed   By: Dalphine Handing M.D.   On: 09/24/2014 08:04    Assessment & Plan:   Problem List Items Addressed This Visit    None      I am having Ms. Lacock maintain her multivitamin, cetirizine, calcium-vitamin D, esomeprazole, pramipexole, Vitamin D3, promethazine, alendronate, DULoxetine, butalbital-acetaminophen-caffeine, and escitalopram.  No orders of the defined types were placed in this encounter.    There are no discontinued medications.  Follow-up: No Follow-up on file.   Sherlene Shams, MD

## 2015-04-25 NOTE — Assessment & Plan Note (Signed)
Strongly suggested by exam and history.  EMG nerve conduction studies ordered.  Patient fitted with bilateral wrist braces to wear nightly and with use of hands. prescribed meloxicam

## 2015-05-24 ENCOUNTER — Other Ambulatory Visit: Payer: Self-pay | Admitting: Internal Medicine

## 2015-07-12 ENCOUNTER — Encounter: Payer: Self-pay | Admitting: Internal Medicine

## 2015-07-12 ENCOUNTER — Ambulatory Visit (INDEPENDENT_AMBULATORY_CARE_PROVIDER_SITE_OTHER): Payer: Medicare Other | Admitting: Internal Medicine

## 2015-07-12 VITALS — BP 118/74 | HR 72 | Temp 97.8°F | Resp 12 | Ht 63.0 in | Wt 174.8 lb

## 2015-07-12 DIAGNOSIS — E66811 Obesity, class 1: Secondary | ICD-10-CM

## 2015-07-12 DIAGNOSIS — R5383 Other fatigue: Secondary | ICD-10-CM | POA: Diagnosis not present

## 2015-07-12 DIAGNOSIS — E559 Vitamin D deficiency, unspecified: Secondary | ICD-10-CM | POA: Diagnosis not present

## 2015-07-12 DIAGNOSIS — E785 Hyperlipidemia, unspecified: Secondary | ICD-10-CM

## 2015-07-12 DIAGNOSIS — Z7289 Other problems related to lifestyle: Secondary | ICD-10-CM | POA: Diagnosis not present

## 2015-07-12 DIAGNOSIS — N8110 Cystocele, unspecified: Secondary | ICD-10-CM

## 2015-07-12 DIAGNOSIS — IMO0002 Reserved for concepts with insufficient information to code with codable children: Secondary | ICD-10-CM

## 2015-07-12 DIAGNOSIS — E669 Obesity, unspecified: Secondary | ICD-10-CM

## 2015-07-12 NOTE — Progress Notes (Signed)
Subjective:  Patient ID: Erin Aguilar, female    DOB: November 12, 1950  Age: 65 y.o. MRN: [id number]  CC: The primary encounter diagnosis was Other problems related to lifestyle. Diagnoses of Hyperlipidemia, Other fatigue, Vitamin D deficiency, Obesity (BMI 30.0-34.9), and Bladder cystocele were also pertinent to this visit.  HPI Erin Aguilar presents for follow up on chronic issues  s/p vaginal vault prolapse repair  By Dr Doy HutchingWeidner  On jan 31 .  Right buttock pain caused by damage to the pudendal nerve was present for 5 weeks before gradually resolving. Feels much better now and has been released to resume normal activities including exercise.   Obesity:  History of gastric bypass in 2011 for weight of 230 lbs preoperatively.  [facility] not [facility] since 2014, at which point she had reached her nadir of 169 lbs. Has gained 9 lbs since then but in the last several months he has lost a few lbs,  And her edema has resolved.  She is walking but not regularly   Diet reviewed in detail. :  Eats 6 smaller meals daily    Outpatient Prescriptions Prior to Visit  Medication Sig Dispense Refill  . alendronate (FOSAMAX) 70 MG tablet Take 1 tablet (70 mg total) by mouth once a week. Take with a full glass of water on an empty stomach. 12 tablet 2  . butalbital-acetaminophen-caffeine (FIORICET WITH CODEINE) 50-325-40-30 MG per capsule TAKE 1 CAPSULE BY MOUTH EVERY 6 HOURS AS NEEDED for pain 90 capsule 3  . calcium-vitamin D (OSCAL) 250-125 MG-UNIT per tablet Take 1 tablet by mouth daily.    . cetirizine (ZYRTEC) 10 MG tablet Take 10 mg by mouth daily.    . Cholecalciferol (VITAMIN D3) 2000 UNITS capsule Take 2,000 Units by mouth daily.     . DULoxetine (CYMBALTA) 20 MG capsule Take 1 capsule (20 mg total) by mouth daily. For 2 weeks,  Then begin the taper as directed 90 capsule 1  . escitalopram (LEXAPRO) 10 MG tablet TAKE 1 TABLET BY MOUTH ONCE DAILY. 30 tablet 3  . esomeprazole (NEXIUM)  40 MG capsule Take 1 capsule (40 mg total) by mouth daily before breakfast. 30 capsule 6  . meloxicam (MOBIC) 15 MG tablet Take 1 tablet (15 mg total) by mouth daily. With food 30 tablet 3  . Multiple Vitamin (MULTIVITAMIN) tablet Take 1 tablet by mouth daily.    . pramipexole (MIRAPEX) 0.25 MG tablet TAKE 1 TABLET BY MOUTH ONCE DAILY AT 10 P.M. 30 tablet 5  . promethazine (PHENERGAN) 25 MG tablet TAKE (1) TABLET BY MOUTH EVERY SIX HOURS AS NEEDED. 30 tablet 3   No facility-administered medications prior to visit.    Review of Systems;  Patient denies headache, fevers, malaise, unintentional weight loss, skin rash, eye pain, sinus congestion and sinus pain, sore throat, dysphagia,  hemoptysis , cough, dyspnea, wheezing, chest pain, palpitations, orthopnea, edema, abdominal pain, nausea, melena, diarrhea, constipation, flank pain, dysuria, hematuria, urinary  Frequency, nocturia, numbness, tingling, seizures,  Focal weakness, Loss of consciousness,  Tremor, insomnia, depression, anxiety, and suicidal ideation.      Objective:  BP 118/74 mmHg  Pulse 72  Temp(Src) 97.8 F (36.6 C) (Oral)  Resp 12  Ht 5\' 3"  (1.6 m)  Wt 174 lb 12 oz (79.266 kg)  BMI 30.96 kg/m2  SpO2 98%  BP Readings from Last 3 Encounters:  07/12/15 118/74  04/23/15 120/78  01/11/15 116/80    Wt Readings from Last 3  Encounters:  07/12/15 174 lb 12 oz (79.266 kg)  04/23/15 178 lb (80.74 kg)  01/11/15 178 lb 8 oz (80.967 kg)    General appearance: alert, cooperative and appears stated age Ears: normal TM's and external ear canals both ears Throat: lips, mucosa, and tongue normal; teeth and gums normal Neck: no adenopathy, no carotid bruit, supple, symmetrical, trachea midline and thyroid not enlarged, symmetric, no tenderness/mass/nodules Back: symmetric, no curvature. ROM normal. No CVA tenderness. Lungs: clear to auscultation bilaterally Heart: regular rate and rhythm, S1, S2 normal, no murmur, click, rub or  gallop Abdomen: soft, non-tender; bowel sounds normal; no masses,  no organomegaly Pulses: 2+ and symmetric Skin: Skin color, texture, turgor normal. No rashes or lesions Lymph nodes: Cervical, supraclavicular, and axillary nodes normal.  Lab Results  Component Value Date   HGBA1C 5.7 12/14/2014   HGBA1C 5.8 07/23/2013    Lab Results  Component Value Date   CREATININE 0.73 02/10/2015   CREATININE 0.75 07/27/2014   CREATININE 0.6 07/23/2013    Lab Results  Component Value Date   WBC 4.6 07/27/2014   HGB 13.4 07/27/2014   HCT 39.5 07/27/2014   PLT 240.0 07/27/2014   GLUCOSE 96 02/10/2015   CHOL 160 07/27/2014   TRIG 122.0 07/27/2014   HDL 61.60 07/27/2014   LDLDIRECT 106.0 06/10/2012   LDLCALC 74 07/27/2014   ALT 14 02/10/2015   AST 21 02/10/2015   NA 140 02/10/2015   K 4.5 02/10/2015   CL 108 02/10/2015   CREATININE 0.73 02/10/2015   BUN 12 02/10/2015   CO2 25 02/10/2015   TSH 1.24 07/27/2014   HGBA1C 5.7 12/14/2014    Dg Bone Density  09/23/2014  EXAM: DUAL X-RAY ABSORPTIOMETRY (DXA) FOR BONE MINERAL DENSITY IMPRESSION: Dear Dr Darrick Huntsman, [name] completed a BMD test on 09/23/2014 using the Lunar iDXA DXA System (analysis version: 14.10) manufactured by [employer]. The following summarizes the results of our evaluation. PATIENT BIOGRAPHICAL: Name: Erin, Aguilar Patient ID: [id number] Birth Date: 1951/01/11 Height: 62.0 in. Gender: Female Exam Date: 09/23/2014 Weight: 173.9 lbs. Indications: Caucasian, Height Loss, Hysterectomy, Postmenopausal Fractures: Treatments: CALCIUM VIT D, nexium ASSESSMENT: The BMD measured at AP Spine L1-L3 is 0.817 g/cm2 with a T-score of -2.9. This patient is considered osteoporotic according to World Health Organization [facility]) criteria. Site Region Measured Measured WHO Young Adult BMD Date       Age      Classification T-score 0.803 g/cm2 AP Spine L1-L3 09/23/2014 66.2 Osteoporosis -2.9 0.817 g/cm2 AP Spine L1-L3 06/13/2012  63.9 Osteopenia -1.9 0.944 g/cm2 DualFemur Neck Left 09/23/2014 66.2 Osteopenia -2.1 0.741 g/cm2 DualFemur Neck Left 06/13/2012 63.9 Osteopenia -1.4 0.843 g/cm2 World Health Organization [facility]) criteria for post-menopausal, Caucasian Women: Normal:       T-score at or above -1 SD Osteopenia:   T-score between -1 and -2.5 SD Osteoporosis: T-score at or below -2.5 SD RECOMMENDATIONS: National Osteoporosis Foundation recommends that FDA-approved medical therapies be considered in postmenopausal women and men age 54 or older with a: 1. Hip or vertebral (clinical or morphometric) fracture. 2. T-score of < -2.5 at the spine or hip. 3. Ten-year fracture probability by FRAX of 3% or greater for hip fracture or 20% or greater for major osteoporotic fracture. All treatment decisions require clinical judgment and consideration of individual patient factors, including patient preferences, co-morbidities, previous drug use, risk factors not captured in the FRAX model (e.g. falls, vitamin D deficiency, increased bone turnover, interval significant decline in bone density)  and possible under - or over-estimation of fracture risk by FRAX. All patients should ensure an adequate intake of dietary calcium (1200 mg/d) and vitamin D (800 IU daily) unless contraindicated. FOLLOW-UP: People with diagnosed cases of osteoporosis or at high risk for fracture should have regular bone mineral density tests. For patients eligible for Medicare, routine testing is allowed once every 2 years. The testing frequency can be increased to one year for patients who have rapidly progressing disease, those who are receiving or discontinuing medical therapy to restore bone mass, or have additional risk factors. I have reviewed this report, and agree with the above findings. [facility] Radiology Electronically Signed   By: David  [place] M.D.   On: 09/23/2014 15:16   Mm Screening Breast Tomo Bilateral  09/24/2014  CLINICAL DATA:  Screening. EXAM: DIGITAL  SCREENING BILATERAL MAMMOGRAM WITH 3D TOMO WITH CAD COMPARISON:  Previous exam(s). ACR Breast Density Category b: There are scattered areas of fibroglandular density. FINDINGS: There are no findings suspicious for malignancy. Images were processed with CAD. IMPRESSION: No mammographic evidence of malignancy. A result letter of this screening mammogram will be mailed directly to the patient. RECOMMENDATION: Screening mammogram in one year. (Code:SM-B-01Y) BI-RADS CATEGORY  1: Negative. Electronically Signed   By: Dalphine Handing M.D.   On: 09/24/2014 08:04    Assessment & Plan:   Problem List Items Addressed This Visit    Obesity (BMI 30.0-34.9)    I have congratulated her in reduction of   BMI and encouraged  Continued weight loss with goal of 10% of body weigh over the next 6 months using a low glycemic index diet and regular exercise a minimum of 5 days per week.        Bladder cystocele    S/p surgical correction on Jan 31 with repair of vaginal vault repair. Recovery was complicated by a pudendal nerve damage which has now recovered and she is pain free       Other Visit Diagnoses    Other problems related to lifestyle    -  Primary    Relevant Orders    Hepatitis C antibody    HIV antibody    Hyperlipidemia        Relevant Orders    Lipid panel    Other fatigue        Relevant Orders    Comprehensive metabolic panel    CBC with Differential/Platelet    Vitamin D deficiency        Relevant Orders    VITAMIN D 25 Hydroxy (Vit-D Deficiency, Fractures)     A total of 25 minutes of face to face time was spent with patient more than half of which was spent in counselling about the above mentioned conditions  and coordination of care   I am having Ms. Wynter maintain her multivitamin, cetirizine, calcium-vitamin D, esomeprazole, pramipexole, Vitamin D3, promethazine, alendronate, DULoxetine, butalbital-acetaminophen-caffeine, meloxicam, and escitalopram.  No orders of the defined types  were placed in this encounter.    There are no discontinued medications.  Follow-up: Return in about 4 weeks (around 08/09/2015) for wellness,  fasting labs .   Sherlene Shams, MD

## 2015-07-12 NOTE — Patient Instructions (Addendum)
Congratulations on the weight loss! You have 5 more lbs go et your BMI < 30.  Low carb diet and regular exercise are the key  Premier Protein shakes  Are a great way to start the day   [employer] and [employer]  Delicious!!  1 g sugar 30 g protein

## 2015-07-12 NOTE — Progress Notes (Signed)
Pre-visit discussion using our clinic review tool. No additional management support is needed unless otherwise documented below in the visit note.  

## 2015-07-13 NOTE — Assessment & Plan Note (Signed)
S/p surgical correction on Jan 31 with repair of vaginal vault repair. Recovery was complicated by a pudendal nerve damage which has now recovered and she is pain free

## 2015-07-13 NOTE — Assessment & Plan Note (Signed)
I have congratulated her in reduction of   BMI and encouraged  Continued weight loss with goal of 10% of body weigh over the next 6 months using a low glycemic index diet and regular exercise a minimum of 5 days per week.    

## 2015-07-22 ENCOUNTER — Other Ambulatory Visit: Payer: Self-pay | Admitting: Internal Medicine

## 2015-08-10 ENCOUNTER — Other Ambulatory Visit: Payer: Medicare Other

## 2015-08-17 ENCOUNTER — Encounter: Payer: Medicare Other | Admitting: Internal Medicine

## 2015-09-03 ENCOUNTER — Other Ambulatory Visit: Payer: Medicare Other

## 2015-09-06 ENCOUNTER — Other Ambulatory Visit (INDEPENDENT_AMBULATORY_CARE_PROVIDER_SITE_OTHER): Payer: Medicare Other

## 2015-09-06 DIAGNOSIS — R5383 Other fatigue: Secondary | ICD-10-CM

## 2015-09-06 DIAGNOSIS — Z7289 Other problems related to lifestyle: Secondary | ICD-10-CM

## 2015-09-06 DIAGNOSIS — E785 Hyperlipidemia, unspecified: Secondary | ICD-10-CM | POA: Diagnosis not present

## 2015-09-06 DIAGNOSIS — E559 Vitamin D deficiency, unspecified: Secondary | ICD-10-CM | POA: Diagnosis not present

## 2015-09-06 LAB — CBC WITH DIFFERENTIAL/PLATELET
Basophils Absolute: 0 10*3/uL (ref 0.0–0.1)
Basophils Relative: 0.5 % (ref 0.0–3.0)
Eosinophils Absolute: 0.2 10*3/uL (ref 0.0–0.7)
Eosinophils Relative: 4 % (ref 0.0–5.0)
HCT: 37.5 % (ref 36.0–46.0)
Hemoglobin: 12.6 g/dL (ref 12.0–15.0)
Lymphocytes Relative: 38.9 % (ref 12.0–46.0)
Lymphs Abs: 1.8 10*3/uL (ref 0.7–4.0)
MCHC: 33.6 g/dL (ref 30.0–36.0)
MCV: 99.4 fl (ref 78.0–100.0)
MONOS PCT: 8.7 % (ref 3.0–12.0)
Monocytes Absolute: 0.4 10*3/uL (ref 0.1–1.0)
NEUTROS ABS: 2.2 10*3/uL (ref 1.4–7.7)
Neutrophils Relative %: 47.9 % (ref 43.0–77.0)
PLATELETS: 213 10*3/uL (ref 150.0–400.0)
RBC: 3.77 Mil/uL — ABNORMAL LOW (ref 3.87–5.11)
RDW: 12.7 % (ref 11.5–15.5)
WBC: 4.6 10*3/uL (ref 4.0–10.5)

## 2015-09-06 LAB — COMPREHENSIVE METABOLIC PANEL
ALT: 14 U/L (ref 0–35)
AST: 15 U/L (ref 0–37)
Albumin: 4.1 g/dL (ref 3.5–5.2)
Alkaline Phosphatase: 65 U/L (ref 39–117)
BUN: 14 mg/dL (ref 6–23)
CHLORIDE: 108 meq/L (ref 96–112)
CO2: 27 mEq/L (ref 19–32)
Calcium: 9.1 mg/dL (ref 8.4–10.5)
Creatinine, Ser: 0.65 mg/dL (ref 0.40–1.20)
GFR: 96.58 mL/min (ref >60.00)
GLUCOSE: 94 mg/dL (ref 70–99)
POTASSIUM: 4.2 meq/L (ref 3.5–5.1)
SODIUM: 141 meq/L (ref 135–145)
TOTAL PROTEIN: 6.7 g/dL (ref 6.0–8.3)
Total Bilirubin: 0.4 mg/dL (ref 0.2–1.2)

## 2015-09-06 LAB — VITAMIN D 25 HYDROXY (VIT D DEFICIENCY, FRACTURES): VITD: 31.92 ng/mL (ref 30.00–100.00)

## 2015-09-06 LAB — LIPID PANEL
Cholesterol: 170 mg/dL (ref 0–200)
HDL: 53.3 mg/dL (ref >39.00)
LDL Cholesterol: 92 mg/dL (ref 0–99)
NonHDL: 116.63
Total CHOL/HDL Ratio: 3
Triglycerides: 121 mg/dL (ref 0.0–149.0)
VLDL: 24.2 mg/dL (ref 0.0–40.0)

## 2015-09-07 ENCOUNTER — Ambulatory Visit (INDEPENDENT_AMBULATORY_CARE_PROVIDER_SITE_OTHER): Payer: Medicare Other | Admitting: Internal Medicine

## 2015-09-07 ENCOUNTER — Encounter: Payer: Self-pay | Admitting: Internal Medicine

## 2015-09-07 VITALS — BP 108/70 | HR 62 | Temp 97.7°F | Resp 12 | Ht 63.0 in | Wt 180.0 lb

## 2015-09-07 DIAGNOSIS — Z1239 Encounter for other screening for malignant neoplasm of breast: Secondary | ICD-10-CM | POA: Diagnosis not present

## 2015-09-07 DIAGNOSIS — Z0000 Encounter for general adult medical examination without abnormal findings: Secondary | ICD-10-CM | POA: Diagnosis not present

## 2015-09-07 DIAGNOSIS — E669 Obesity, unspecified: Secondary | ICD-10-CM

## 2015-09-07 LAB — HIV ANTIBODY (ROUTINE TESTING W REFLEX): HIV 1&2 Ab, 4th Generation: NONREACTIVE

## 2015-09-07 LAB — HEPATITIS C ANTIBODY: HCV AB: NEGATIVE

## 2015-09-07 MED ORDER — ESCITALOPRAM OXALATE 10 MG PO TABS: 10.0000 mg | ORAL_TABLET | Freq: Every day | ORAL | Status: DC

## 2015-09-07 MED ORDER — PRAMIPEXOLE DIHYDROCHLORIDE 0.25 MG PO TABS: ORAL_TABLET | ORAL | Status: DC

## 2015-09-07 MED ORDER — MELOXICAM 15 MG PO TABS: 15.0000 mg | ORAL_TABLET | Freq: Every day | ORAL | Status: DC

## 2015-09-07 MED ORDER — ESOMEPRAZOLE MAGNESIUM 40 MG PO CPDR: 40.0000 mg | DELAYED_RELEASE_CAPSULE | Freq: Every day | ORAL | Status: DC

## 2015-09-07 MED ORDER — ALENDRONATE SODIUM 70 MG PO TABS
70.0000 mg | ORAL_TABLET | ORAL | Status: DC
Start: 2015-09-07 — End: 2016-03-03

## 2015-09-07 MED ORDER — BUTALBITAL-APAP-CAFF-COD 50-325-40-30 MG PO CAPS: ORAL_CAPSULE | ORAL | Status: DC

## 2015-09-07 MED ORDER — DULOXETINE HCL 20 MG PO CPEP: ORAL_CAPSULE | ORAL | Status: DC

## 2015-09-07 NOTE — Patient Instructions (Signed)
We will use Cologuard for your colon cancer Screening .  It will be sent to your home from the company  Menopause is a normal process in which your reproductive ability comes to an end. This process happens gradually over a span of months to years, usually between the ages of 49 and 57. Menopause is complete when you have missed 12 consecutive menstrual periods. It is important to talk with your health care provider about some of the most common conditions that affect postmenopausal women, such as heart disease, cancer, and bone loss (osteoporosis). Adopting a healthy lifestyle and getting preventive care can help to promote your health and wellness. Those actions can also lower your chances of developing some of these common conditions. WHAT SHOULD I KNOW ABOUT MENOPAUSE? During menopause, you may experience a number of symptoms, such as:  Moderate-to-severe hot flashes.  Night sweats.  Decrease in sex drive.  Mood swings.  Headaches.  Tiredness.  Irritability.  Memory problems.  Insomnia. Choosing to treat or not to treat menopausal changes is an individual decision that you make with your health care provider. WHAT SHOULD I KNOW ABOUT HORMONE REPLACEMENT THERAPY AND SUPPLEMENTS? Hormone therapy products are effective for treating symptoms that are associated with menopause, such as hot flashes and night sweats. Hormone replacement carries certain risks, especially as you become older. If you are thinking about using estrogen or estrogen with progestin treatments, discuss the benefits and risks with your health care provider. WHAT SHOULD I KNOW ABOUT HEART DISEASE AND STROKE? Heart disease, heart attack, and stroke become more likely as you age. This may be due, in part, to the hormonal changes that your body experiences during menopause. These can affect how your body processes dietary fats, triglycerides, and cholesterol. Heart attack and stroke are both medical emergencies. There  are many things that you can do to help prevent heart disease and stroke:  Have your blood pressure checked at least every 1-2 years. High blood pressure causes heart disease and increases the risk of stroke.  If you are 35-1 years old, ask your health care provider if you should take aspirin to prevent a heart attack or a stroke.  Do not use any tobacco products, including cigarettes, chewing tobacco, or electronic cigarettes. If you need help quitting, ask your health care provider.  It is important to eat a healthy diet and maintain a healthy weight.  Be sure to include plenty of vegetables, fruits, low-fat dairy products, and lean protein.  Avoid eating foods that are high in solid fats, added sugars, or salt (sodium).  Get regular exercise. This is one of the most important things that you can do for your health.  Try to exercise for at least 150 minutes each week. The type of exercise that you do should increase your heart rate and make you sweat. This is known as moderate-intensity exercise.  Try to do strengthening exercises at least twice each week. Do these in addition to the moderate-intensity exercise.  Know your numbers.Ask your health care provider to check your cholesterol and your blood glucose. Continue to have your blood tested as directed by your health care provider. WHAT SHOULD I KNOW ABOUT CANCER SCREENING? There are several types of cancer. Take the following steps to reduce your risk and to catch any cancer development as early as possible. Breast Cancer  Practice breast self-awareness.  This means understanding how your breasts normally appear and feel.  It also means doing regular breast self-exams. Let your health  care provider know about any changes, no matter how small.  If you are 40 or older, have a clinician do a breast exam (clinical breast exam or CBE) every year. Depending on your age, family history, and medical history, it may be recommended that  you also have a yearly breast X-ray (mammogram).  If you have a family history of breast cancer, talk with your health care provider about genetic screening.  If you are at high risk for breast cancer, talk with your health care provider about having an MRI and a mammogram every year.  Breast cancer (BRCA) gene test is recommended for women who have family members with BRCA-related cancers. Results of the assessment will determine the need for genetic counseling and BRCA1 and for BRCA2 testing. BRCA-related cancers include these types:  Breast. This occurs in males or females.  Ovarian.  Tubal. This may also be called fallopian tube cancer.  Cancer of the abdominal or pelvic lining (peritoneal cancer).  Prostate.  Pancreatic. Cervical, Uterine, and Ovarian Cancer Your health care provider may recommend that you be screened regularly for cancer of the pelvic organs. These include your ovaries, uterus, and vagina. This screening involves a pelvic exam, which includes checking for microscopic changes to the surface of your cervix (Pap test).  For women ages 21-65, health care providers may recommend a pelvic exam and a Pap test every three years. For women ages 54-65, they may recommend the Pap test and pelvic exam, combined with testing for human papilloma virus (HPV), every five years. Some types of HPV increase your risk of cervical cancer. Testing for HPV may also be done on women of any age who have unclear Pap test results.  Other health care providers may not recommend any screening for nonpregnant women who are considered low risk for pelvic cancer and have no symptoms. Ask your health care provider if a screening pelvic exam is right for you.  If you have had past treatment for cervical cancer or a condition that could lead to cancer, you need Pap tests and screening for cancer for at least 20 years after your treatment. If Pap tests have been discontinued for you, your risk factors  (such as having a new sexual partner) need to be reassessed to determine if you should start having screenings again. Some women have medical problems that increase the chance of getting cervical cancer. In these cases, your health care provider may recommend that you have screening and Pap tests more often.  If you have a family history of uterine cancer or ovarian cancer, talk with your health care provider about genetic screening.  If you have vaginal bleeding after reaching menopause, tell your health care provider.  There are currently no reliable tests available to screen for ovarian cancer. Lung Cancer Lung cancer screening is recommended for adults 65-37 years old who are at high risk for lung cancer because of a history of smoking. A yearly low-dose CT scan of the lungs is recommended if you:  Currently smoke.  Have a history of at least 30 pack-years of smoking and you currently smoke or have quit within the past 15 years. A pack-year is smoking an average of one pack of cigarettes per day for one year. Yearly screening should:  Continue until it has been 15 years since you quit.  Stop if you develop a health problem that would prevent you from having lung cancer treatment. Colorectal Cancer  This type of cancer can be detected and can  often be prevented.  Routine colorectal cancer screening usually begins at age 3 and continues through age 31.  If you have risk factors for colon cancer, your health care provider may recommend that you be screened at an earlier age.  If you have a family history of colorectal cancer, talk with your health care provider about genetic screening.  Your health care provider may also recommend using home test kits to check for hidden blood in your stool.  A small camera at the end of a tube can be used to examine your colon directly (sigmoidoscopy or colonoscopy). This is done to check for the earliest forms of colorectal cancer.  Direct  examination of the colon should be repeated every 5-10 years until age 18. However, if early forms of precancerous polyps or small growths are found or if you have a family history or genetic risk for colorectal cancer, you may need to be screened more often. Skin Cancer  Check your skin from head to toe regularly.  Monitor any moles. Be sure to tell your health care provider:  About any new moles or changes in moles, especially if there is a change in a mole's shape or color.  If you have a mole that is larger than the size of a pencil eraser.  If any of your family members has a history of skin cancer, especially at a young age, talk with your health care provider about genetic screening.  Always use sunscreen. Apply sunscreen liberally and repeatedly throughout the day.  Whenever you are outside, protect yourself by wearing long sleeves, pants, a wide-brimmed hat, and sunglasses. WHAT SHOULD I KNOW ABOUT OSTEOPOROSIS? Osteoporosis is a condition in which bone destruction happens more quickly than new bone creation. After menopause, you may be at an increased risk for osteoporosis. To help prevent osteoporosis or the bone fractures that can happen because of osteoporosis, the following is recommended:  If you are 57-49 years old, get at least 1,000 mg of calcium and at least 600 mg of vitamin D per day.  If you are older than age 14 but younger than age 4, get at least 1,200 mg of calcium and at least 600 mg of vitamin D per day.  If you are older than age 91, get at least 1,200 mg of calcium and at least 800 mg of vitamin D per day. Smoking and excessive alcohol intake increase the risk of osteoporosis. Eat foods that are rich in calcium and vitamin D, and do weight-bearing exercises several times each week as directed by your health care provider. WHAT SHOULD I KNOW ABOUT HOW MENOPAUSE AFFECTS [place]? Depression may occur at any age, but it is more common as you become older.  Common symptoms of depression include:  Low or sad mood.  Changes in sleep patterns.  Changes in appetite or eating patterns.  Feeling an overall lack of motivation or enjoyment of activities that you previously enjoyed.  Frequent crying spells. Talk with your health care provider if you think that you are experiencing depression. WHAT SHOULD I KNOW ABOUT IMMUNIZATIONS? It is important that you get and maintain your immunizations. These include:  Tetanus, diphtheria, and pertussis (Tdap) booster vaccine.  Influenza every year before the flu season begins.  Pneumonia vaccine.  Shingles vaccine. Your health care provider may also recommend other immunizations.   This information is not intended to replace advice given to you by your health care provider. Make sure you discuss any questions you have  with your health care provider.   Document Released: 05/26/2005 Document Revised: 04/24/2014 Document Reviewed: 12/04/2013 Elsevier Interactive Patient Education [employer].

## 2015-09-07 NOTE — Assessment & Plan Note (Signed)
I have addressed  BMI and recommended wt loss of 10% of body weight over the next 6 months using a low fat, low starch, high protein  fruit/vegetable based Mediterranean diet and 30 minutes of aerobic exercise a minimum of 5 days per week.   

## 2015-09-07 NOTE — Progress Notes (Signed)
Patient ID: Erin MeyerBrenda A Aguilar, female    DOB: [birth year]  Age: 65 y.o. MRN: [id number]  The patient is here for annual physical  examination and management of other chronic and acute problems.   She is s/p hysterectomy  Colonoscopy due 2007 [facility] discussed cologuard. Diverticulosis only,  No FH   Mammogram due   The risk factors are reflected in the social history.  The roster of all physicians providing medical care to patient - is listed in the Snapshot section of the chart.  Home safety : The patient has smoke detectors in the home. They wear seatbelts.  There are no firearms at home. There is no violence in the home.   There is no risks for hepatitis, STDs or HIV. There is no   history of blood transfusion. They have no travel history to infectious disease endemic areas of the world.  The patient has seen their dentist in the last six month. They have seen their eye doctor in the last year.   They do not  have excessive sun exposure. Discussed the need for sun protection: hats, long sleeves and use of sunscreen if there is significant sun exposure.   Diet: the importance of a healthy diet is discussed. They do have a healthy diet.  The benefits of regular aerobic exercise were discussed. She walks 4 times per week ,  20 minutes.   Depression screen: there are no signs or vegative symptoms of depression- irritability, change in appetite, anhedonia, sadness/tearfullness.   The following portions of the patient's history were reviewed and updated as appropriate: allergies, current medications, past family history, past medical history,  past surgical history, past social history  and problem list.  Visual acuity was not assessed per patient preference since she has regular follow up with her ophthalmologist. Hearing and body mass index were assessed and reviewed.   During the course of the visit the patient was educated and counseled about appropriate screening and preventive services  including : fall prevention , diabetes screening, nutrition counseling, colorectal cancer screening, and recommended immunizations.    CC: The primary encounter diagnosis was Breast cancer screening. Diagnoses of Obesity (BMI 30.0-34.9) and Routine general medical examination at a health care facility were also pertinent to this visit.  History [name] has a past medical history of Arthritis; Chicken pox; Diverticulitis; Headache(784.0); GERD (gastroesophageal reflux disease); Allergy; Hyperlipidemia; Migraines; Colon polyp; UTI (lower urinary tract infection); Hemorrhoids, internal, with bleeding; Hemochromatosis carrier; and H/O: pertussis.   She has past surgical history that includes Cholecystectomy (1976); Abdominal hysterectomy (1988); Gastric bypass (03/2010); Hernia repair (2008); and Joint replacement (Bilateral, 02/26/2014).   Her family history includes Arthritis in her father, maternal grandfather, maternal grandmother, and mother; Cancer in her brother and mother; Cancer (age of onset: 5942) in her sister; Diabetes in her maternal grandfather and maternal grandmother; Early death (age of onset: 2956) in her mother; Heart disease in her maternal grandfather and maternal grandmother; Kidney disease in her brother; Stroke in her father.She reports that she quit smoking about 28 years ago. She does not have any smokeless tobacco history on file. She reports that she drinks about 1.5 oz of alcohol per week. She reports that she does not use illicit drugs.  Outpatient Prescriptions Prior to Visit  Medication Sig Dispense Refill  . calcium-vitamin D (OSCAL) 250-125 MG-UNIT per tablet Take 1 tablet by mouth daily.    . cetirizine (ZYRTEC) 10 MG tablet Take 10 mg by mouth daily.    .Marland Kitchen  Cholecalciferol (VITAMIN D3) 2000 UNITS capsule Take 2,000 Units by mouth daily.     . Multiple Vitamin (MULTIVITAMIN) tablet Take 1 tablet by mouth daily.    Marland Kitchen alendronate (FOSAMAX) 70 MG tablet Take 1 tablet (70 mg  total) by mouth once a week. Take with a full glass of water on an empty stomach. 12 tablet 2  . butalbital-acetaminophen-caffeine (FIORICET WITH CODEINE) 50-325-40-30 MG per capsule TAKE 1 CAPSULE BY MOUTH EVERY 6 HOURS AS NEEDED for pain 90 capsule 3  . DULoxetine (CYMBALTA) 20 MG capsule TAKE 1 CAPSULE ONCE DAILY FOR 14 DAYS; THEN BEGIN TAPER AS DIRECTED. 90 capsule 1  . escitalopram (LEXAPRO) 10 MG tablet TAKE 1 TABLET BY MOUTH ONCE DAILY. 30 tablet 3  . esomeprazole (NEXIUM) 40 MG capsule Take 1 capsule (40 mg total) by mouth daily before breakfast. 30 capsule 6  . meloxicam (MOBIC) 15 MG tablet Take 1 tablet (15 mg total) by mouth daily. With food 30 tablet 3  . pramipexole (MIRAPEX) 0.25 MG tablet TAKE 1 TABLET BY MOUTH ONCE DAILY AT 10 P.M. 30 tablet 5  . promethazine (PHENERGAN) 25 MG tablet TAKE (1) TABLET BY MOUTH EVERY SIX HOURS AS NEEDED. 30 tablet 3   No facility-administered medications prior to visit.    Review of Systems   Patient denies headache, fevers, malaise, unintentional weight loss, skin rash, eye pain, sinus congestion and sinus pain, sore throat, dysphagia,  hemoptysis , cough, dyspnea, wheezing, chest pain, palpitations, orthopnea, edema, abdominal pain, nausea, melena, diarrhea, constipation, flank pain, dysuria, hematuria, urinary  Frequency, nocturia, numbness, tingling, seizures,  Focal weakness, Loss of consciousness,  Tremor, insomnia, depression, anxiety, and suicidal ideation.      Objective:  BP 108/70 mmHg  Pulse 62  Temp(Src) 97.7 F (36.5 C) (Oral)  Resp 12  Ht 5\' 3"  (1.6 m)  Wt 180 lb (81.647 kg)  BMI 31.89 kg/m2  SpO2 97%  Physical Exam   General appearance: alert, cooperative and appears stated age Head: Normocephalic, without obvious abnormality, atraumatic Eyes: conjunctivae/corneas clear. PERRL, EOM's intact. Fundi benign. Ears: normal TM's and external ear canals both ears Nose: Nares normal. Septum midline. Mucosa normal. No drainage  or sinus tenderness. Throat: lips, mucosa, and tongue normal; teeth and gums normal Neck: no adenopathy, no carotid bruit, no JVD, supple, symmetrical, trachea midline and thyroid not enlarged, symmetric, no tenderness/mass/nodules Lungs: clear to auscultation bilaterally Breasts: normal appearance, no masses or tenderness Heart: regular rate and rhythm, S1, S2 normal, no murmur, click, rub or gallop Abdomen: soft, non-tender; bowel sounds normal; no masses,  no organomegaly Extremities: extremities normal, atraumatic, no cyanosis or edema Pulses: 2+ and symmetric Skin: Skin color, texture, turgor normal. No rashes or lesions Neurologic: Alert and oriented X 3, normal strength and tone. Normal symmetric reflexes. Normal coordination and gait.     Assessment & Plan:   Problem List Items Addressed This Visit    Obesity (BMI 30.0-34.9)    I have addressed  BMI and recommended wt loss of 10% of body weight over the next 6 months using a low fat, low starch, high protein  fruit/vegetable based Mediterranean diet and 30 minutes of aerobic exercise a minimum of 5 days per week.        Routine general medical examination at a health care facility    Annual Medicare wellness  exam was done as well as a comprehensive physical exam and management of acute and chronic conditions .  During the course of the visit  the patient was educated and counseled about appropriate screening and preventive services including : fall prevention , diabetes screening, nutrition counseling, colorectal cancer screening, and recommended immunizations.  Printed recommendations for health maintenance screenings was given.        Other Visit Diagnoses    Breast cancer screening    -  Primary    Relevant Orders    MM DIGITAL SCREENING BILATERAL       I have discontinued Ms. Dax's promethazine. I have also changed her DULoxetine, escitalopram, and butalbital-acetaminophen-caffeine. Additionally, I am having her  maintain her multivitamin, cetirizine, calcium-vitamin D, Vitamin D3, alendronate, esomeprazole, meloxicam, and pramipexole.  Meds ordered this encounter  Medications  . alendronate (FOSAMAX) 70 MG tablet    Sig: Take 1 tablet (70 mg total) by mouth once a week. Take with a full glass of water on an empty stomach.    Dispense:  12 tablet    Refill:  2    90 day supply  . DULoxetine (CYMBALTA) 20 MG capsule    Sig: 1 capsule daily    Dispense:  90 capsule    Refill:  1  . escitalopram (LEXAPRO) 10 MG tablet    Sig: Take 1 tablet (10 mg total) by mouth daily.    Dispense:  90 tablet    Refill:  1  . esomeprazole (NEXIUM) 40 MG capsule    Sig: Take 1 capsule (40 mg total) by mouth daily before breakfast.    Dispense:  30 capsule    Refill:  6  . meloxicam (MOBIC) 15 MG tablet    Sig: Take 1 tablet (15 mg total) by mouth daily. With food    Dispense:  90 tablet    Refill:  1  . pramipexole (MIRAPEX) 0.25 MG tablet    Sig: TAKE 1 TABLET BY MOUTH ONCE DAILY AT 10 P.M.    Dispense:  90 tablet    Refill:  1  . butalbital-acetaminophen-caffeine (FIORICET WITH CODEINE) 50-325-40-30 MG capsule    Sig: TAKE 1 CAPSULE BY MOUTH EVERY 6 HOURS AS NEEDED for pain    Dispense:  90 capsule    Refill:  3    Medications Discontinued During This Encounter  Medication Reason  . promethazine (PHENERGAN) 25 MG tablet   . alendronate (FOSAMAX) 70 MG tablet Reorder  . DULoxetine (CYMBALTA) 20 MG capsule Reorder  . escitalopram (LEXAPRO) 10 MG tablet Reorder  . esomeprazole (NEXIUM) 40 MG capsule Reorder  . meloxicam (MOBIC) 15 MG tablet Reorder  . pramipexole (MIRAPEX) 0.25 MG tablet Reorder  . butalbital-acetaminophen-caffeine (FIORICET WITH CODEINE) 50-325-40-30 MG per capsule Reorder    Follow-up: Return in about 6 months (around 03/09/2016).   Sherlene Shams, MD

## 2015-09-07 NOTE — Assessment & Plan Note (Signed)

## 2015-09-07 NOTE — Progress Notes (Signed)
Pre-visit discussion using our clinic review tool. No additional management support is needed unless otherwise documented below in the visit note.  

## 2015-09-22 NOTE — Progress Notes (Signed)
Cologuard ordered

## 2015-09-22 NOTE — Addendum Note (Signed)
Addended by: Dennie BibleAVIS, Laith Antonelli R on: 09/22/2015 03:10 PM   Modules accepted: Kipp BroodSmartSet

## 2015-10-04 LAB — COLOGUARD: COLOGUARD: POSITIVE

## 2015-10-13 ENCOUNTER — Telehealth: Payer: Self-pay

## 2015-10-13 DIAGNOSIS — R195 Other fecal abnormalities: Secondary | ICD-10-CM

## 2015-10-13 NOTE — Telephone Encounter (Signed)
Results in yellow folder abstracted

## 2015-10-13 NOTE — Telephone Encounter (Signed)
Left message on voicemail to return call to office

## 2015-10-13 NOTE — Telephone Encounter (Signed)
Call received from [facility].  Patient had Positive Cologuard screening test.   Results were faxed over on 10/12/2015.  Please advise.

## 2015-10-13 NOTE — Telephone Encounter (Signed)
  The results of  Your cologuard test were positive meaning  abnormal. This may indicate a polyp or cancer somewhere in the colon.  I  would like to refer you  to GI for further evaluation .   Are you  willing to go and  Do you have a preference whom you see?   Regards,   Duncan Dulleresa Kyren Knick, MD

## 2015-10-14 NOTE — Telephone Encounter (Signed)
Patient notified and MD preference as to GI.

## 2015-10-14 NOTE — Addendum Note (Signed)
Addended by: Sherlene ShamsULLO, Mamoudou Mulvehill L on: 10/14/2015 11:35 AM   Modules accepted: Orders

## 2015-10-14 NOTE — Telephone Encounter (Signed)
Patient aware.

## 2015-10-14 NOTE — Telephone Encounter (Signed)
Your referral is in process as requested. Our referral coordinator will call you when the appointment has been made.  

## 2015-10-14 NOTE — Telephone Encounter (Signed)
Patient returned call, she will be at [phone] today

## 2015-10-25 ENCOUNTER — Encounter: Payer: Self-pay | Admitting: Gastroenterology

## 2015-11-03 ENCOUNTER — Other Ambulatory Visit: Payer: Self-pay | Admitting: Internal Medicine

## 2015-11-03 ENCOUNTER — Ambulatory Visit
Admission: RE | Admit: 2015-11-03 | Discharge: 2015-11-03 | Disposition: A | Discharge status: Home / Self Care | Payer: Medicare Other | Source: Ambulatory Visit | Attending: Internal Medicine | Admitting: Internal Medicine

## 2015-11-03 DIAGNOSIS — Z1231 Encounter for screening mammogram for malignant neoplasm of breast: Secondary | ICD-10-CM

## 2015-11-03 DIAGNOSIS — Z1239 Encounter for other screening for malignant neoplasm of breast: Secondary | ICD-10-CM

## 2015-11-21 ENCOUNTER — Ambulatory Visit
Admission: EM | Admit: 2015-11-21 | Discharge: 2015-11-21 | Disposition: A | Discharge status: Home / Self Care | Payer: Medicare Other | Attending: Family Medicine | Admitting: Family Medicine

## 2015-11-21 DIAGNOSIS — N390 Urinary tract infection, site not specified: Secondary | ICD-10-CM

## 2015-11-21 LAB — URINALYSIS COMPLETE WITH MICROSCOPIC (ARMC ONLY)
Bilirubin Urine: NEGATIVE
Glucose, UA: 100 mg/dL — AB
Ketones, ur: NEGATIVE mg/dL
Nitrite: NEGATIVE
Specific Gravity, Urine: >1.03 — ABNORMAL HIGH (ref 1.005–1.030)
pH: 5.5 (ref 5.0–8.0)

## 2015-11-21 MED ORDER — CIPROFLOXACIN HCL 500 MG PO TABS: 500.0000 mg | ORAL_TABLET | Freq: Two times a day (BID) | ORAL | 0 refills | Status: DC

## 2015-11-21 MED ORDER — PHENAZOPYRIDINE HCL 200 MG PO TABS: 200.0000 mg | ORAL_TABLET | Freq: Three times a day (TID) | ORAL | 0 refills | Status: DC

## 2015-11-21 NOTE — ED Triage Notes (Signed)
Patient states that she has had some burning with urination that started about 1 week ago and she took some AZO and it improved. Patient states that her symptoms started again last night and have been worsening. Patient states that she also has some bladder pressure after urination.

## 2015-11-21 NOTE — ED Provider Notes (Signed)
CSN: [id number]     Arrival date & time 11/21/15  1319 History   None    Chief Complaint  Patient presents with  . Urinary Tract Infection   (Consider location/radiation/quality/duration/timing/severity/associated sxs/prior Treatment) Pt here for urinary frequency, hesitancy, burning on urination, hx of UTI, for 2 days now. States that she was taking azo at home with no relief. Denies any fevers, no n/v/d, no cv pain.    The history is provided by the patient.    Past Medical History:  Diagnosis Date  . Allergy    Hay fever  . Arthritis   . Chicken pox   . Colon polyp   . Diverticulitis   . GERD (gastroesophageal reflux disease)   . H/O: pertussis   . Headache(784.0)   . 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 Hemorrhoids, internal, with bleeding    occasional  . Hyperlipidemia   . Migraines   . UTI (lower urinary tract infection)    Past Surgical History:  Procedure Laterality Date  . ABDOMINAL HYSTERECTOMY  1988  . CHOLECYSTECTOMY  1976  . GASTRIC BYPASS  03/2010  . HERNIA REPAIR  2008   ventral ,  from scar tissue, Byrnett  . JOINT REPLACEMENT Bilateral 02/26/2014   Dr. Howell Rucks   Family History  Problem Relation Age of Onset  . Arthritis Mother     rheumatoid   . Cancer Mother     lymphoma  . Early death Mother 54    lymphoma  . Arthritis Father   . Stroke Father   . Cancer Sister 61    cervical ca  . Kidney disease Brother   . Cancer Brother     lung ca,  smoker  . Arthritis Maternal Grandmother   . Heart disease Maternal Grandmother   . Diabetes Maternal Grandmother   . Arthritis Maternal Grandfather   . Heart disease Maternal Grandfather   . Diabetes Maternal Grandfather    Social History  Substance Use Topics  . Smoking status: Former Smoker    Quit date: 12/08/1986  . Smokeless tobacco: Never Used  . Alcohol use 1.5 oz/week    3 Standard drinks or equivalent per week   OB History    No data available     Review of  Systems  Constitutional: Negative.   Respiratory: Negative.   Cardiovascular: Negative.   Gastrointestinal: Negative.   Genitourinary: Positive for difficulty urinating, frequency, hematuria and urgency.    Allergies  Oxycodone  Home Medications   Prior to Admission medications   Medication Sig Start Date End Date Taking? Authorizing Provider  alendronate (FOSAMAX) 70 MG tablet Take 1 tablet (70 mg total) by mouth once a week. Take with a full glass of water on an empty stomach. 09/07/15  Yes Sherlene Shams, MD  butalbital-acetaminophen-caffeine (FIORICET WITH CODEINE) 50-325-40-30 MG capsule TAKE 1 CAPSULE BY MOUTH EVERY 6 HOURS AS NEEDED for pain 09/07/15  Yes Sherlene Shams, MD  calcium-vitamin D (OSCAL) 250-125 MG-UNIT per tablet Take 1 tablet by mouth daily.   Yes Historical Provider, MD  cetirizine (ZYRTEC) 10 MG tablet Take 10 mg by mouth daily.   Yes Historical Provider, MD  Cholecalciferol (VITAMIN D3) 2000 UNITS capsule Take 2,000 Units by mouth daily.    Yes Historical Provider, MD  DULoxetine (CYMBALTA) 20 MG capsule 1 capsule daily 09/07/15  Yes Sherlene Shams, MD  escitalopram (LEXAPRO) 10 MG tablet Take 1 tablet (10 mg total) by mouth daily. 09/07/15  Yes Sherlene Shamseresa L Tullo, MD  esomeprazole (NEXIUM) 40 MG capsule Take 1 capsule (40 mg total) by mouth daily before breakfast. 09/07/15  Yes Sherlene Shamseresa L Tullo, MD  meloxicam (MOBIC) 15 MG tablet Take 1 tablet (15 mg total) by mouth daily. With food 09/07/15  Yes Sherlene Shamseresa L Tullo, MD  Multiple Vitamin (MULTIVITAMIN) tablet Take 1 tablet by mouth daily.   Yes Historical Provider, MD  pramipexole (MIRAPEX) 0.25 MG tablet TAKE 1 TABLET BY MOUTH ONCE DAILY AT 10 P.M. 09/07/15  Yes Sherlene Shamseresa L Tullo, MD  ciprofloxacin (CIPRO) 500 MG tablet Take 1 tablet (500 mg total) by mouth 2 (two) times daily. 11/21/15   Tobi BastosMelanie A Manases Etchison, NP  phenazopyridine (PYRIDIUM) 200 MG tablet Take 1 tablet (200 mg total) by mouth 3 (three) times daily. 11/21/15   Tobi BastosMelanie A  Komal Stangelo, NP   Meds Ordered and Administered this Visit  Medications - No data to display  BP (!) 114/59 (BP Location: Left Arm)   Pulse 74   Temp 98 F (36.7 C) (Oral)   Resp 16   Ht 5\' 3"  (1.6 m)   Wt 175 lb (79.4 kg)   SpO2 99%   BMI 31.00 kg/m  No data found.   Physical Exam  Constitutional: She is oriented to person, place, and time. She appears well-developed and well-nourished.  Cardiovascular: Normal rate, regular rhythm and normal heart sounds.   Pulmonary/Chest: Effort normal and breath sounds normal.  Abdominal: Soft. Bowel sounds are normal.  Genitourinary:  Genitourinary Comments: Frequency, hematuria, denies any discharge.   Neurological: She is alert and oriented to person, place, and time.  Skin: Skin is warm and dry.    Urgent Care Course   Clinical Course    Procedures (including critical care time)  Labs Review Labs Reviewed  URINALYSIS COMPLETEWITH MICROSCOPIC ([facility] ONLY) - Abnormal; Notable for the following:       Result Value   APPearance CLOUDY (*)    Glucose, UA 100 (*)    Specific Gravity, Urine >1.030 (*)    Hgb urine dipstick MODERATE (*)    Protein, ur >300 (*)    Leukocytes, UA TRACE (*)    Bacteria, UA FEW (*)    Squamous Epithelial / LPF 0-5 (*)    All other components within normal limits  URINE CULTURE    Imaging Review No results found.   Visual Acuity Review  Right Eye Distance:   Left Eye Distance:   Bilateral Distance:    Right Eye Near:   Left Eye Near:    Bilateral Near:         MDM   1. UTI (lower urinary tract infection)    Take full dose of ABX with food, -follow up with pcp to have urine rechecked upon completed of abx -may use the pyridium for 2 days but if symptoms are not better need to be re-evaluated. Stay hydrated and drink fluids avoid high and sugar in drinks.       Tobi BastosMelanie A [name] [name], NP 11/21/15 [phone]

## 2015-11-21 NOTE — Discharge Instructions (Signed)
Follow up with your primary care to have urine rechecked. If symptoms worsen go to the ER.

## 2015-11-23 LAB — URINE CULTURE: Culture: <10000 — AB

## 2015-11-26 ENCOUNTER — Telehealth: Payer: Self-pay | Admitting: *Deleted

## 2015-11-26 MED ORDER — SULFAMETHOXAZOLE-TRIMETHOPRIM 800-160 MG PO TABS: 1.0000 | ORAL_TABLET | Freq: Two times a day (BID) | ORAL | 0 refills | Status: DC

## 2015-11-26 NOTE — Telephone Encounter (Signed)
Please advise 

## 2015-11-26 NOTE — Telephone Encounter (Signed)
Patient called and states [place] [facility] [facility] [place] . They prescribed her Cipro., Patient complained it made her itch. She stop taking the medication. She was wondering if there was anything else she can take. She has an appt on 11/29/15 to f/u on the  UTI  .Her pharmacy is  [facility] in [place]. Patient states if you have any questions you can call her at  [phone].or [phone]  Please advise.provider.  Thank you

## 2015-11-26 NOTE — Telephone Encounter (Signed)
Yes, I will call in Septra DS  To take instead.  I have no culture data available.

## 2015-11-27 NOTE — Telephone Encounter (Signed)
Her urine culture from Urgent Care visit grew nothing,  So she should not take the septra that I called in at her request.

## 2015-11-29 ENCOUNTER — Ambulatory Visit (INDEPENDENT_AMBULATORY_CARE_PROVIDER_SITE_OTHER): Payer: Medicare Other | Admitting: Family

## 2015-11-29 ENCOUNTER — Encounter: Payer: Self-pay | Admitting: Family

## 2015-11-29 VITALS — BP 112/74 | HR 72 | Temp 97.9°F | Ht 63.0 in | Wt 182.2 lb

## 2015-11-29 DIAGNOSIS — R399 Unspecified symptoms and signs involving the genitourinary system: Secondary | ICD-10-CM

## 2015-11-29 DIAGNOSIS — Z0000 Encounter for general adult medical examination without abnormal findings: Secondary | ICD-10-CM

## 2015-11-29 NOTE — Assessment & Plan Note (Deleted)
Up-to-date on colonoscopy, mammogram. History of hysterectomy,no further pap smears. Patient is due for Pneumovax 23 and politely declined this today. She will have this done when she has her flu shot done this fall.

## 2015-11-29 NOTE — Progress Notes (Signed)
Subjective:    Patient ID: Erin Aguilar, female    DOB: 1950-12-04, 65 y.o.   MRN: [id number]  CC: Erin Aguilar is a [age] y.o. female who presents today for follow up.   HPI: Patient here for follow-up to UTI and she was in urgent care 8/6 and started on Cipro however discontinued due to itching. She was then sent in a pyridum well as an antibiotic per patient. She also states that she's got to 3 more days of antibiotic and she's completed Pyridium. States feeling much better.   No fever, flank pain, hematuria.  Note, per chart review, I do not see antibiotic sent in for patient. I see Pyridium however I'm unsure what antibiotic the patient is referring to and if she got this from urgent care which she states she did not.  Per chart review, urine culture 8/6 did not clearly demonstrate a UTI  HISTORY:  Past Medical History:  Diagnosis Date  . Allergy    Hay fever  . Arthritis   . Chicken pox   . Colon polyp   . Diverticulitis   . GERD (gastroesophageal reflux disease)   . H/O: pertussis   . Headache(784.0)   . 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 Hemorrhoids, internal, with bleeding    occasional  . Hyperlipidemia   . Migraines   . UTI (lower urinary tract infection)    Past Surgical History:  Procedure Laterality Date  . ABDOMINAL HYSTERECTOMY  1988  . CHOLECYSTECTOMY  1976  . GASTRIC BYPASS  03/2010  . HERNIA REPAIR  2008   ventral ,  from scar tissue, Byrnett  . JOINT REPLACEMENT Bilateral 02/26/2014   Dr. Howell Rucksalph [place]   Family History  Problem Relation Age of Onset  . Arthritis Mother     rheumatoid   . Cancer Mother     lymphoma  . Early death Mother 2856    lymphoma  . Arthritis Father   . Stroke Father   . Cancer Sister 2942    cervical ca  . Kidney disease Brother   . Cancer Brother     lung ca,  smoker  . Arthritis Maternal Grandmother   . Heart disease Maternal Grandmother   . Diabetes Maternal Grandmother   . Arthritis Maternal  Grandfather   . Heart disease Maternal Grandfather   . Diabetes Maternal Grandfather     Allergies: Ciprofloxacin and Oxycodone Current Outpatient Prescriptions on File Prior to Visit  Medication Sig Dispense Refill  . alendronate (FOSAMAX) 70 MG tablet Take 1 tablet (70 mg total) by mouth once a week. Take with a full glass of water on an empty stomach. 12 tablet 2  . butalbital-acetaminophen-caffeine (FIORICET WITH CODEINE) 50-325-40-30 MG capsule TAKE 1 CAPSULE BY MOUTH EVERY 6 HOURS AS NEEDED for pain 90 capsule 3  . calcium-vitamin D (OSCAL) 250-125 MG-UNIT per tablet Take 1 tablet by mouth daily.    . cetirizine (ZYRTEC) 10 MG tablet Take 10 mg by mouth daily.    . Cholecalciferol (VITAMIN D3) 2000 UNITS capsule Take 2,000 Units by mouth daily.     . DULoxetine (CYMBALTA) 20 MG capsule 1 capsule daily 90 capsule 1  . escitalopram (LEXAPRO) 10 MG tablet Take 1 tablet (10 mg total) by mouth daily. 90 tablet 1  . esomeprazole (NEXIUM) 40 MG capsule Take 1 capsule (40 mg total) by mouth daily before breakfast. 30 capsule 6  . meloxicam (MOBIC) 15 MG tablet Take 1 tablet (  15 mg total) by mouth daily. With food 90 tablet 1  . Multiple Vitamin (MULTIVITAMIN) tablet Take 1 tablet by mouth daily.    . phenazopyridine (PYRIDIUM) 200 MG tablet Take 1 tablet (200 mg total) by mouth 3 (three) times daily. 6 tablet 0  . pramipexole (MIRAPEX) 0.25 MG tablet TAKE 1 TABLET BY MOUTH ONCE DAILY AT 10 P.M. 90 tablet 1   No current facility-administered medications on file prior to visit.     Social History  Substance Use Topics  . Smoking status: Former Smoker    Quit date: 12/08/1986  . Smokeless tobacco: Never Used  . Alcohol use 1.5 oz/week    3 Standard drinks or equivalent per week    Review of Systems  Constitutional: Negative for chills and fever.  Respiratory: Negative for cough.   Cardiovascular: Negative for chest pain and palpitations.  Gastrointestinal: Negative for nausea and  vomiting.  Genitourinary: Negative for dyspareunia, dysuria, flank pain, frequency and vaginal bleeding.      Objective:    BP 112/74   Pulse 72   Temp 97.9 F (36.6 C) (Oral)   Ht 5\' 3"  (1.6 m)   Wt 182 lb 3.2 oz (82.6 kg)   SpO2 96%   BMI 32.28 kg/m  BP Readings from Last 3 Encounters:  11/29/15 112/74  11/21/15 (!) 114/59  09/07/15 108/70   Wt Readings from Last 3 Encounters:  11/29/15 182 lb 3.2 oz (82.6 kg)  11/21/15 175 lb (79.4 kg)  09/07/15 180 lb (81.6 kg)    Physical Exam  Constitutional: She appears well-developed and well-nourished.  Cardiovascular: Normal rate, regular rhythm, normal heart sounds and normal pulses.   Pulmonary/Chest: Effort normal and breath sounds normal. She has no wheezes. She has no rhonchi. She has no rales.  Abdominal: There is no CVA tenderness.  Neurological: She is alert.  Skin: Skin is warm and dry.  Psychiatric: She has a normal mood and affect. Her speech is normal and behavior is normal. Thought content normal.  Vitals reviewed.      Assessment & Plan:   1. Urinary symptom or sign Resolved since visit urgent care. Urine culture did not reflect UTI. Return precautions were given to patient.    I have discontinued Erin Aguilar's ciprofloxacin. I am also having her maintain her multivitamin, cetirizine, calcium-vitamin D, Vitamin D3, alendronate, DULoxetine, escitalopram, esomeprazole, meloxicam, pramipexole, butalbital-acetaminophen-caffeine, and phenazopyridine.   No orders of the defined types were placed in this encounter.   Return precautions given.   Risks, benefits, and alternatives of the medications and treatment plan prescribed today were discussed, and patient expressed understanding.   Education regarding symptom management and diagnosis given to patient on AVS.  Continue to follow with TULLO, Mar DaringERESA L, MD for routine health maintenance.   Erin Aguilar and I agreed with plan.   Rennie PlowmanMargaret Giancarlo Askren, FNP

## 2015-11-29 NOTE — Progress Notes (Signed)
Pre visit review using our clinic review tool, if applicable. No additional management support is needed unless otherwise documented below in the visit note. 

## 2015-11-29 NOTE — Patient Instructions (Signed)
You look great and happy you're feeling better.

## 2015-11-29 NOTE — Telephone Encounter (Signed)
[id number] 

## 2015-12-01 NOTE — Telephone Encounter (Signed)
Pt called back returning call to laurie neese from 8/11 regarding results. Thank you!  Call pt @ [phone].

## 2015-12-01 NOTE — Telephone Encounter (Signed)
Spoke with the patient, reviewed results, advised not to take the Septra. thanks

## 2015-12-03 ENCOUNTER — Other Ambulatory Visit: Payer: Self-pay | Admitting: Internal Medicine

## 2015-12-22 ENCOUNTER — Other Ambulatory Visit: Payer: Self-pay | Admitting: Internal Medicine

## 2015-12-23 ENCOUNTER — Encounter: Payer: Self-pay | Admitting: Gastroenterology

## 2015-12-23 ENCOUNTER — Encounter (INDEPENDENT_AMBULATORY_CARE_PROVIDER_SITE_OTHER): Payer: Self-pay

## 2015-12-23 ENCOUNTER — Telehealth: Payer: Self-pay | Admitting: Internal Medicine

## 2015-12-23 ENCOUNTER — Ambulatory Visit (INDEPENDENT_AMBULATORY_CARE_PROVIDER_SITE_OTHER): Payer: Medicare Other | Admitting: Gastroenterology

## 2015-12-23 VITALS — BP 122/74 | HR 78 | Ht 63.0 in | Wt 183.0 lb

## 2015-12-23 DIAGNOSIS — R195 Other fecal abnormalities: Secondary | ICD-10-CM

## 2015-12-23 MED ORDER — NA SULFATE-K SULFATE-MG SULF 17.5-3.13-1.6 GM/177ML PO SOLN: 1.0000 | Freq: Once | ORAL | 0 refills | Status: AC

## 2015-12-23 MED ORDER — ONDANSETRON HCL 4 MG PO TABS: 4.0000 mg | ORAL_TABLET | Freq: Three times a day (TID) | ORAL | 0 refills | Status: DC | PRN

## 2015-12-23 NOTE — Telephone Encounter (Signed)
Faxed to the number provided. thanks

## 2015-12-23 NOTE — Telephone Encounter (Signed)
Erin Aguilar called from GI at [facility] she is needing pt positive GI results. Pt has a appt today with them @ 3pm. Please fax to [phone]. Thank you!

## 2015-12-23 NOTE — Patient Instructions (Signed)
If you are age 67 or older, your body mass index should be between 23-30. Your Body mass index is 32.42 kg/m. If this is out of the aforementioned range listed, please consider follow up with your Primary Care Provider.  If you are age 67 or younger, your body mass index should be between 19-25. Your Body mass index is 32.42 kg/m. If this is out of the aformentioned range listed, please consider follow up with your Primary Care Provider.   You have been scheduled for a colonoscopy. Please follow written instructions given to you at your visit today.  Please pick up your prep supplies at the pharmacy within the next 1-3 days. If you use inhalers (even only as needed), please bring them with you on the day of your procedure. Your physician has requested that you go to www.startemmi.com and enter the access code given to you at your visit today. This web site gives a general overview about your procedure. However, you should still follow specific instructions given to you by our office regarding your preparation for the procedure.  Thank you for choosing [facility]  Dr Amada JupiterHenry Danis III

## 2015-12-23 NOTE — Progress Notes (Signed)
[facility] Consult Note:  History: Erin Aguilar 12/23/2015  Referring physician: Crecencio Mc, MD  Reason for consult/chief complaint: Colon Cancer Screening (Hx of colon polyps. Last colon 10y ago)   Subjective  HPI:  Erin Aguilar was referred by primary care for positive colo-guard test from 10/04/2015. She had a colonoscopy 10 years ago done out of this area. She believes there were polyps, but she was also told to come back in 10 years, suggesting the polyps were likely hyperplastic. At PCP visit she was offered either colonoscopy or colo-guard, so she chose the latter. Erin Aguilar denies abdominal pain altered bowel habits or rectal bleeding. Her CBC was normal in May 2017.   ROS:  Review of Systems  Constitutional: Negative for appetite change and unexpected weight change.  HENT: Negative for mouth sores and voice change.   Eyes: Negative for pain and redness.  Respiratory: Negative for cough and shortness of breath.   Cardiovascular: Negative for chest pain and palpitations.  Genitourinary: Negative for dysuria and hematuria.  Musculoskeletal: Negative for arthralgias and myalgias.  Skin: Negative for pallor and rash.  Neurological: Negative for weakness and headaches.  Hematological: Negative for adenopathy.     Past Medical History: Past Medical History:  Diagnosis Date  . Allergy    Hay fever  . Arthritis   . Chicken pox   . Colon polyp   . Diverticulitis   . GERD (gastroesophageal reflux disease)   . H/O: pertussis   . Headache(784.0)   . 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 Hemorrhoids, internal, with bleeding    occasional  . Hyperlipidemia   . Migraines   . UTI (lower urinary tract infection)      Past Surgical History: Past Surgical History:  Procedure Laterality Date  . ABDOMINAL HYSTERECTOMY  1988  . CHOLECYSTECTOMY  1976  . GASTRIC BYPASS  03/2010  . HERNIA REPAIR  2008   ventral ,  from scar tissue, Byrnett  . JOINT  REPLACEMENT Bilateral 02/26/2014   Dr. Smith Mince     Family History: Family History  Problem Relation Age of Onset  . Arthritis Mother     rheumatoid   . Cancer Mother     lymphoma  . Early death Mother 55    lymphoma  . Arthritis Father   . Stroke Father   . Cancer Sister 20    cervical ca  . Kidney disease Brother   . Cancer Brother     lung ca,  smoker  . Arthritis Maternal Grandmother   . Heart disease Maternal Grandmother   . Diabetes Maternal Grandmother   . Arthritis Maternal Grandfather   . Heart disease Maternal Grandfather   . Diabetes Maternal Grandfather     Social History: Social History   Social History  . Marital status: Married    Spouse name: N/A  . Number of children: 3  . Years of education: N/A   Occupational History  . retired    Social History Main Topics  . Smoking status: Former Smoker    Quit date: 12/08/1986  . Smokeless tobacco: Never Used  . Alcohol use 1.5 oz/week    3 Standard drinks or equivalent per week  . Drug use: No  . Sexual activity: Not Asked   Other Topics Concern  . None   Social History Narrative  . None    Allergies: Allergies  Allergen Reactions  . Ciprofloxacin Itching  . Oxycodone Itching    Outpatient Meds:  Current Outpatient Prescriptions  Medication Sig Dispense Refill  . alendronate (FOSAMAX) 70 MG tablet Take 1 tablet (70 mg total) by mouth once a week. Take with a full glass of water on an empty stomach. 12 tablet 2  . butalbital-acetaminophen-caffeine (FIORICET WITH CODEINE) 50-325-40-30 MG capsule TAKE 1 CAPSULE BY MOUTH EVERY 6 HOURS AS NEEDED for pain 90 capsule 3  . calcium-vitamin D (OSCAL) 250-125 MG-UNIT per tablet Take 1 tablet by mouth daily.    . cetirizine (ZYRTEC) 10 MG tablet Take 10 mg by mouth daily.    . Cholecalciferol (VITAMIN D3) 2000 UNITS capsule Take 2,000 Units by mouth daily.     . DULoxetine (CYMBALTA) 20 MG capsule TAKE (1) CAPSULE BY MOUTH ONCE DAILY. 90 capsule 0   . escitalopram (LEXAPRO) 10 MG tablet TAKE 1 TABLET BY MOUTH ONCE DAILY. 90 tablet 0  . esomeprazole (NEXIUM) 40 MG capsule Take 1 capsule (40 mg total) by mouth daily before breakfast. 30 capsule 6  . meloxicam (MOBIC) 15 MG tablet Take 1 tablet (15 mg total) by mouth daily. With food 90 tablet 1  . Multiple Vitamin (MULTIVITAMIN) tablet Take 1 tablet by mouth daily.    . pramipexole (MIRAPEX) 0.25 MG tablet TAKE 1 TABLET BY MOUTH ONCE DAILY AT 10 P.M. 90 tablet 1  . Na Sulfate-K Sulfate-Mg Sulf 17.5-3.13-1.6 GM/180ML SOLN Take 1 kit by mouth once. 354 mL 0  . ondansetron (ZOFRAN) 4 MG tablet Take 1 tablet (4 mg total) by mouth every 8 (eight) hours as needed for nausea or vomiting. Please use one tablet before your bowel prep 2 tablet 0   No current facility-administered medications for this visit.       ___________________________________________________________________ Objective   Exam:  BP 122/74   Pulse 78   Ht '5\' 3"'  (1.6 m)   Wt 183 lb (83 kg)   BMI 32.42 kg/m    General: this is a(n) Well-appearing woman   Eyes: sclera anicteric, no redness  ENT: oral mucosa moist without lesions, no cervical or supraclavicular lymphadenopathy, good dentition  CV: RRR without murmur, S1/S2, no JVD, no peripheral edema  Resp: clear to auscultation bilaterally, normal RR and effort noted  GI: soft, no tenderness, with active bowel sounds. No guarding or palpable organomegaly noted.  Skin; warm and dry, no rash or jaundice noted  Neuro: awake, alert and oriented x 3. Normal gross motor function and fluent speech  Labs: As above  Assessment: Encounter Diagnosis  Name Primary?  . Positive fecal immunochemical test Yes    Colonoscopy to work up positive: Guard test. She understands this is now a diagnostic rather than a screening procedure. She is agreeable  The benefits and risks of the planned procedure were described in detail with the patient or (when appropriate) their  health care proxy.  Risks were outlined as including, but not limited to, bleeding, infection, perforation, adverse medication reaction leading to cardiac or pulmonary decompensation, or pancreatitis (if ERCP).  The limitation of incomplete mucosal visualization was also discussed.  No guarantees or warranties were given.   Thank you for the courtesy of this consult.  Please call me with any questions or concerns.  Nelida Meuse III  CC: Crecencio Mc, MD

## 2015-12-24 ENCOUNTER — Encounter: Payer: Self-pay | Admitting: Gastroenterology

## 2015-12-29 ENCOUNTER — Encounter: Payer: Self-pay | Admitting: Gastroenterology

## 2015-12-29 ENCOUNTER — Ambulatory Visit (AMBULATORY_SURGERY_CENTER): Payer: Medicare Other | Admitting: Gastroenterology

## 2015-12-29 VITALS — BP 113/70 | HR 49 | Temp 95.9°F | Resp 13 | Ht 63.0 in | Wt 183.0 lb

## 2015-12-29 DIAGNOSIS — R195 Other fecal abnormalities: Secondary | ICD-10-CM

## 2015-12-29 MED ORDER — SODIUM CHLORIDE 0.9 % IV SOLN: 500.0000 mL | INTRAVENOUS | Status: AC

## 2015-12-29 NOTE — Patient Instructions (Signed)
YOU HAD AN ENDOSCOPIC PROCEDURE TODAY AT THE [facility]:   Refer to the procedure report that was given to you for any specific questions about what was found during the examination.  If the procedure report does not answer your questions, please call your gastroenterologist to clarify.  If you requested that your care partner not be given the details of your procedure findings, then the procedure report has been included in a sealed envelope for you to review at your convenience later.  YOU SHOULD EXPECT: Some feelings of bloating in the abdomen. Passage of more gas than usual.  Walking can help get rid of the air that was put into your GI tract during the procedure and reduce the bloating. If you had a lower endoscopy (such as a colonoscopy or flexible sigmoidoscopy) you may notice spotting of blood in your stool or on the toilet paper. If you underwent a bowel prep for your procedure, you may not have a normal bowel movement for a few days.  Please Note:  You might notice some irritation and congestion in your nose or some drainage.  This is from the oxygen used during your procedure.  There is no need for concern and it should clear up in a day or so.  SYMPTOMS TO REPORT IMMEDIATELY:   Following lower endoscopy (colonoscopy or flexible sigmoidoscopy):  Excessive amounts of blood in the stool  Significant tenderness or worsening of abdominal pains  Swelling of the abdomen that is new, acute  Fever of 100F or higher   Following upper endoscopy (EGD)  Vomiting of blood or coffee ground material  New chest pain or pain under the shoulder blades  Painful or persistently difficult swallowing  New shortness of breath  Fever of 100F or higher  Black, tarry-looking stools  For urgent or emergent issues, a gastroenterologist can be reached at any hour by calling [phone].   DIET:  We do recommend a small meal at first, but then you may proceed to your regular diet.  Drink  plenty of fluids but you should avoid alcoholic beverages for 24 hours.  ACTIVITY:  You should plan to take it easy for the rest of today and you should NOT DRIVE or use heavy machinery until tomorrow (because of the sedation medicines used during the test).    FOLLOW UP: Our staff will call the number listed on your records the next business day following your procedure to check on you and address any questions or concerns that you may have regarding the information given to you following your procedure. If we do not reach you, we will leave a message.  However, if you are feeling well and you are not experiencing any problems, there is no need to return our call.  We will assume that you have returned to your regular daily activities without incident.  If any biopsies were taken you will be contacted by phone or by letter within the next 1-3 weeks.  Please call us at [phone] if you have not heard about the biopsies in 3 weeks.    SIGNATURES/CONFIDENTIALITY: You and/or your care partner have signed paperwork which will be entered into your electronic medical record.  These signatures attest to the fact that that the information above on your After Visit Summary has been reviewed and is understood.  Full responsibility of the confidentiality of this discharge information lies with you and/or your care-partner.  Diverticulosis and high fiber diet information given. 

## 2015-12-29 NOTE — Progress Notes (Signed)
A/ox3, pleased with MAC, report to RN 

## 2015-12-29 NOTE — Op Note (Signed)
[facility] Patient [name] Procedure Date: 12/29/2015 9:09 AM MRN: [id number] Endoscopist: Sherilyn Cooter L. Myrtie Neither , MD Age: 65 Referring MD:  Date of Birth: 1950-08-17 Gender: Female Account #: [id number] Procedure:                Colonoscopy Indications:              Positive Cologuard test Medicines:                Monitored Anesthesia Care Procedure:                Pre-Anesthesia Assessment:                           - Prior to the procedure, a History and Physical                            was performed, and patient medications and                            allergies were reviewed. The patient's tolerance of                            previous anesthesia was also reviewed. The risks                            and benefits of the procedure and the sedation                            options and risks were discussed with the patient.                            All questions were answered, and informed consent                            was obtained. Prior Anticoagulants: The patient has                            taken no previous anticoagulant or antiplatelet                            agents. ASA Grade Assessment: II - A patient with                            mild systemic disease. After reviewing the risks                            and benefits, the patient was deemed in                            satisfactory condition to undergo the procedure.                           After obtaining informed consent, the colonoscope  was passed under direct vision. Throughout the                            procedure, the patient's blood pressure, pulse, and                            oxygen saturations were monitored continuously. The                            Model CF-HQ190L [id number](SN#[id number]) scope was introduced                            through the anus and advanced to the the cecum,                            identified by appendiceal orifice and  ileocecal                            valve. The colonoscopy was performed without                            difficulty. The patient tolerated the procedure                            well. The quality of the bowel preparation was                            good. The ileocecal valve, appendiceal orifice, and                            rectum were photographed. The quality of the bowel                            preparation was evaluated using the BBPS [facility]                            Bowel Preparation Scale) with scores of: Right                            Colon = 2, Transverse Colon = 2 and Left Colon = 2.                            The total BBPS score equals 6. The bowel                            preparation used was SUPREP. Scope In: 9:29:53 AM Scope Out: 9:42:22 AM Scope Withdrawal Time: 0 hours 9 minutes 22 seconds  Total Procedure Duration: 0 hours 12 minutes 29 seconds  Findings:                 The perianal and digital rectal examinations were                            normal.  Multiple diverticula were found in the left colon                            and right colon.                           The exam was otherwise without abnormality on                            direct and retroflexion views. Complications:            No immediate complications. Estimated Blood Loss:     Estimated blood loss: none. Impression:               - Diverticulosis in the left colon and in the right                            colon.                           - The examination was otherwise normal on direct                            and retroflexion views.                           - No specimens collected. Recommendation:           - Patient has a contact number available for                            emergencies. The signs and symptoms of potential                            delayed complications were discussed with the                            patient. Return to  normal activities tomorrow.                            Written discharge instructions were provided to the                            patient.                           - Resume previous diet.                           - Continue present medications.                           - Repeat colonoscopy in 10 years for screening                            purposes. Henry L. Myrtie Neither, MD 12/29/2015 9:47:04 AM This report has been signed electronically.

## 2015-12-30 ENCOUNTER — Telehealth: Payer: Self-pay | Admitting: *Deleted

## 2015-12-30 NOTE — Telephone Encounter (Signed)
  Follow up Call-  Call back number 12/29/2015  Post procedure Call Back phone  # [phone]  Permission to leave phone message Yes  Some recent data might be hidden    Pt was in bed, spoke with husband Patient questions:  Do you have a fever, pain , or abdominal swelling? No. Pain Score  0 *  Have you tolerated food without any problems? Yes.    Have you been able to return to your normal activities? Yes.    Do you have any questions about your discharge instructions: Diet   No. Medications  No. Follow up visit  No.  Do you have questions or concerns about your Care? No.  Actions: * If pain score is 4 or above: No action needed, pain <4.

## 2016-01-12 ENCOUNTER — Ambulatory Visit (INDEPENDENT_AMBULATORY_CARE_PROVIDER_SITE_OTHER): Payer: Medicare Other | Admitting: *Deleted

## 2016-01-12 ENCOUNTER — Other Ambulatory Visit: Payer: Self-pay | Admitting: *Deleted

## 2016-01-12 DIAGNOSIS — Z23 Encounter for immunization: Secondary | ICD-10-CM | POA: Diagnosis not present

## 2016-01-12 MED ORDER — PROMETHAZINE HCL 25 MG PO TABS: ORAL_TABLET | ORAL | 3 refills | Status: DC

## 2016-03-03 ENCOUNTER — Other Ambulatory Visit: Payer: Self-pay | Admitting: Internal Medicine

## 2016-03-14 ENCOUNTER — Ambulatory Visit: Payer: Medicare Other | Admitting: Internal Medicine

## 2016-04-12 ENCOUNTER — Telehealth: Payer: Self-pay | Admitting: Internal Medicine

## 2016-04-12 MED ORDER — SULFAMETHOXAZOLE-TRIMETHOPRIM 800-160 MG PO TABS: 1.0000 | ORAL_TABLET | Freq: Two times a day (BID) | ORAL | 0 refills | Status: DC

## 2016-04-12 NOTE — Telephone Encounter (Signed)
Patient advised of below and verbalized an understanding  cancelled appointment

## 2016-04-12 NOTE — Telephone Encounter (Signed)
Having pain after urinating X 2 days.  Bladder spasms after finishing urinating .  No fever, no low back pain.  Taking AZO Standard . Marland Kitchen.  Appointment scheduled for Friday.  Please advise

## 2016-04-12 NOTE — Telephone Encounter (Signed)
Septra ds sent to [facility].  twice daily for 5 days ,  Does not need to be seen Friday if that was her only issue

## 2016-04-12 NOTE — Telephone Encounter (Signed)
Pt called c/o of possible UTI. States that there is pain when she finishes urinating. No appt until Friday, pt is scheduled but asked if there was anything sooner. Please advise, thank you!

## 2016-04-14 ENCOUNTER — Ambulatory Visit: Payer: Medicare Other | Admitting: Family Medicine

## 2016-04-26 ENCOUNTER — Telehealth: Payer: Self-pay

## 2016-04-26 MED ORDER — BUTALBITAL-APAP-CAFF-COD 50-325-40-30 MG PO CAPS: ORAL_CAPSULE | ORAL | 0 refills | Status: DC

## 2016-04-26 NOTE — Telephone Encounter (Signed)
30 days supply authorized but any future refills require an appt to discuss use

## 2016-04-26 NOTE — Telephone Encounter (Signed)
Rx refill Fioricet w/ codeine last refilled 09/07/2015 No new appt  Last OV 09/07/15 Please advise

## 2016-04-27 NOTE — Telephone Encounter (Signed)
Patient has an appt scheduled 06/06/2016

## 2016-06-06 ENCOUNTER — Ambulatory Visit (INDEPENDENT_AMBULATORY_CARE_PROVIDER_SITE_OTHER): Payer: Medicare Other | Admitting: Internal Medicine

## 2016-06-06 ENCOUNTER — Encounter: Payer: Self-pay | Admitting: Internal Medicine

## 2016-06-06 DIAGNOSIS — G894 Chronic pain syndrome: Secondary | ICD-10-CM

## 2016-06-06 DIAGNOSIS — Z9884 Bariatric surgery status: Secondary | ICD-10-CM

## 2016-06-06 DIAGNOSIS — R748 Abnormal levels of other serum enzymes: Secondary | ICD-10-CM

## 2016-06-06 DIAGNOSIS — G44229 Chronic tension-type headache, not intractable: Secondary | ICD-10-CM | POA: Diagnosis not present

## 2016-06-06 MED ORDER — BUTALBITAL-APAP-CAFF-COD 50-325-40-30 MG PO CAPS: ORAL_CAPSULE | ORAL | 5 refills | Status: DC

## 2016-06-06 NOTE — Patient Instructions (Signed)
YOU ARE DOING WELL!  I HAVE REFILLED YOUR FIORICET FOR MANAGED OF HEADACHES,  DO NOT USE NOT MORE THAN 3 DAYS PER WEEK   RETURN IN 3 MONTHS FOR FASTING LABS AND ANNUAL CPE

## 2016-06-06 NOTE — Progress Notes (Signed)
Subjective:  Patient ID: Erin Aguilar, female    DOB: 01/18/1951  Age: 66 y.o. MRN: [id number]  CC: Diagnoses of Chronic pain syndrome, Chronic tension-type headache, not intractable, Elevated liver enzymes, and S/P gastric bypass were pertinent to this visit.  HPI Erin Aguilar presents for FOLLOW UP ON CHRONIC  ISSUES INCLUDING OBESITY WITH  FATTY LIVER,  CHRONIC PAIN AND RECURRENT  HEADACHES .  HEADACHES MANAGED WITH FIORICET HISTORICALLY  .  AVERAGES 2-3 TIMES PER WEEK  CANNOT TAKE NSAIDS DUE TO HISTORY OF GASTRIC BYPASS SURGERY   BOWELS MOVING NORMALLY    HAS JOINT PAIN INCLUDING CERVICAL SPINE/BASE OF NECK .  USING A SUBLINGUAL EXTRACT OF HEMP CANNIBINOID ONE DROP DAILY THAT SHE OBTAINED IN Croom THAT HAS HELPED HER JOINT PAIN    SEEING PODIATRY FOR FOWLER  FOR PLANTAR FASCIITIS ONE INJECTION HELPED.   REPEATR DE DUE THIS SUMMER   Outpatient Medications Prior to Visit  Medication Sig Dispense Refill  . alendronate (FOSAMAX) 70 MG tablet TAKE (1) ONCE A WEEK 30 MINUTES BEFORE BREAKFAST WITH FULL GLASS OF WATER. DO NOT LIE DOWN AFTER. 12 tablet 0  . calcium-vitamin D (OSCAL) 250-125 MG-UNIT per tablet Take 1 tablet by mouth daily.    . cetirizine (ZYRTEC) 10 MG tablet Take 10 mg by mouth daily.    . Cholecalciferol (VITAMIN D3) 2000 UNITS capsule Take 2,000 Units by mouth daily.     . DULoxetine (CYMBALTA) 20 MG capsule TAKE (1) CAPSULE BY MOUTH ONCE DAILY. 90 capsule 0  . escitalopram (LEXAPRO) 10 MG tablet TAKE 1 TABLET BY MOUTH ONCE DAILY. 90 tablet 0  . esomeprazole (NEXIUM) 40 MG capsule Take 1 capsule (40 mg total) by mouth daily before breakfast. 30 capsule 6  . Multiple Vitamin (MULTIVITAMIN) tablet Take 1 tablet by mouth daily.    . pramipexole (MIRAPEX) 0.25 MG tablet TAKE ONE TABLET BY MOUTH AT BEDTIME. 90 tablet 0  . promethazine (PHENERGAN) 25 MG tablet TAKE (1) TABLET BY MOUTH EVERY SIX HOURS AS NEEDED. 30 tablet 3  .  butalbital-acetaminophen-caffeine (FIORICET WITH CODEINE) 50-325-40-30 MG capsule TAKE 1 CAPSULE BY MOUTH EVERY 6 HOURS AS NEEDED for pain 90 capsule 0  . meloxicam (MOBIC) 15 MG tablet TAKE (1) TABLET BY MOUTH DAILY WITH FOOD 90 tablet 0  . ondansetron (ZOFRAN) 4 MG tablet Take 1 tablet (4 mg total) by mouth every 8 (eight) hours as needed for nausea or vomiting. Please use one tablet before your bowel prep 2 tablet 0  . sulfamethoxazole-trimethoprim (BACTRIM DS,SEPTRA DS) 800-160 MG tablet Take 1 tablet by mouth 2 (two) times daily. 10 tablet 0   Facility-Administered Medications Prior to Visit  Medication Dose Route Frequency Provider Last Rate Last Dose  . 0.9 %  sodium chloride infusion  500 mL Intravenous Continuous Charlie Pitter III, MD        Review of Systems;  Patient denies headache, fevers, malaise, unintentional weight loss, skin rash, eye pain, sinus congestion and sinus pain, sore throat, dysphagia,  hemoptysis , cough, dyspnea, wheezing, chest pain, palpitations, orthopnea, edema, abdominal pain, nausea, melena, diarrhea, constipation, flank pain, dysuria, hematuria, urinary  Frequency, nocturia, numbness, tingling, seizures,  Focal weakness, Loss of consciousness,  Tremor, insomnia, depression, anxiety, and suicidal ideation.      Objective:  BP 114/72   Pulse 61   Resp 16   Wt 184 lb (83.5 kg)   SpO2 95%   BMI 32.59 kg/m   BP Readings from Last  3 Encounters:  06/06/16 114/72  12/29/15 113/70  12/23/15 122/74    Wt Readings from Last 3 Encounters:  06/06/16 184 lb (83.5 kg)  12/29/15 183 lb (83 kg)  12/23/15 183 lb (83 kg)    General appearance: alert, cooperative and appears stated age Ears: normal TM's and external ear canals both ears Throat: lips, mucosa, and tongue normal; teeth and gums normal Neck: no adenopathy, no carotid bruit, supple, symmetrical, trachea midline and thyroid not enlarged, symmetric, no tenderness/mass/nodules Back: symmetric, no  curvature. ROM normal. No CVA tenderness. Lungs: clear to auscultation bilaterally Heart: regular rate and rhythm, S1, S2 normal, no murmur, click, rub or gallop Abdomen: soft, non-tender; bowel sounds normal; no masses,  no organomegaly Pulses: 2+ and symmetric Skin: Skin color, texture, turgor normal. No rashes or lesions Lymph nodes: Cervical, supraclavicular, and axillary nodes normal.  Lab Results  Component Value Date   HGBA1C 5.7 12/14/2014   HGBA1C 5.8 07/23/2013    Lab Results  Component Value Date   CREATININE 0.65 09/06/2015   CREATININE 0.73 02/10/2015   CREATININE 0.75 07/27/2014    Lab Results  Component Value Date   WBC 4.6 09/06/2015   HGB 12.6 09/06/2015   HCT 37.5 09/06/2015   PLT 213.0 09/06/2015   GLUCOSE 94 09/06/2015   CHOL 170 09/06/2015   TRIG 121.0 09/06/2015   HDL 53.30 09/06/2015   LDLDIRECT 106.0 06/10/2012   LDLCALC 92 09/06/2015   ALT 14 09/06/2015   AST 15 09/06/2015   NA 141 09/06/2015   K 4.2 09/06/2015   CL 108 09/06/2015   CREATININE 0.65 09/06/2015   BUN 14 09/06/2015   CO2 27 09/06/2015   TSH 1.24 07/27/2014   HGBA1C 5.7 12/14/2014    No results found.  Assessment & Plan:   Problem List Items Addressed This Visit    Chronic headache disorder    continue Fioricet not more than 3 times per week.  Refills given       Relevant Medications   butalbital-acetaminophen-caffeine (FIORICET WITH CODEINE) 50-325-40-30 MG capsule   Chronic pain syndrome    Her pain and anxiety were optimally managed  when cymbalta was used daily  At 20 mg and lexapro at 10 mg daily .  Lodine did provide relief ,but because of her gastric bypass surgery L/t use of NSAIDs is contraindicated.   She is using a cannabinoid extract that she obtained in [place]. The dose is one drop daily        RESOLVED: Elevated liver enzymes    negative serologies for autoimmune hepatitis were done last year as well as abdominal ultrasound,  FH of NASH and  cryptogenic cirrhosis noted.   Repeat LFTS normal.  Lab Results  Component Value Date   ALT 14 09/06/2015   AST 15 09/06/2015   ALKPHOS 65 09/06/2015   BILITOT 0.4 09/06/2015           S/P gastric bypass    I have addressed  BMI and recommended wt loss of 10% of body weight over the next 6 months using a low fat, low starch, high protein  fruit/vegetable based Mediterranean diet and 30 minutes of aerobic exercise a minimum of 5 days per week.           I have discontinued Ms. Weller's ondansetron, meloxicam, and sulfamethoxazole-trimethoprim. I am also having her maintain her multivitamin, cetirizine, calcium-vitamin D, Vitamin D3, esomeprazole, escitalopram, DULoxetine, promethazine, alendronate, pramipexole, UNABLE TO FIND, and butalbital-acetaminophen-caffeine. We will continue to  administer sodium chloride.  Meds ordered this encounter  Medications  . UNABLE TO FIND    Sig: Hemp Cannabinoid extract 1/2 dropper sublingual once daily for arthritis pain.  . butalbital-acetaminophen-caffeine (FIORICET WITH CODEINE) 50-325-40-30 MG capsule    Sig: TAKE 1 CAPSULE BY MOUTH EVERY 6 HOURS AS NEEDED for pain    Dispense:  60 capsule    Refill:  5    Medications Discontinued During This Encounter  Medication Reason  . sulfamethoxazole-trimethoprim (BACTRIM DS,SEPTRA DS) 800-160 MG tablet Patient has not taken in last 30 days  . ondansetron (ZOFRAN) 4 MG tablet Patient has not taken in last 30 days  . meloxicam (MOBIC) 15 MG tablet Patient has not taken in last 30 days  . butalbital-acetaminophen-caffeine (FIORICET WITH CODEINE) 50-325-40-30 MG capsule Reorder   A total of 25 minutes of face to face time was spent with patient more than half of which was spent in counselling about the above mentioned conditions  and coordination of care  Follow-up: Return in about 3 months (around 09/03/2016), or ANNUAL CPE,  FASTING LABS PRIOR .   Sherlene Shams, MD

## 2016-06-06 NOTE — Progress Notes (Signed)
Pre visit review using our clinic review tool, if applicable. No additional management support is needed unless otherwise documented below in the visit note. 

## 2016-06-07 DIAGNOSIS — R51 Headache: Secondary | ICD-10-CM

## 2016-06-07 DIAGNOSIS — R519 Headache, unspecified: Secondary | ICD-10-CM | POA: Insufficient documentation

## 2016-06-07 DIAGNOSIS — G8929 Other chronic pain: Secondary | ICD-10-CM | POA: Insufficient documentation

## 2016-06-07 NOTE — Assessment & Plan Note (Signed)
negative serologies for autoimmune hepatitis were done last year as well as abdominal ultrasound,  FH of NASH and cryptogenic cirrhosis noted.   Repeat LFTS normal.  Lab Results  Component Value Date   ALT 14 09/06/2015   AST 15 09/06/2015   ALKPHOS 65 09/06/2015   BILITOT 0.4 09/06/2015

## 2016-06-07 NOTE — Assessment & Plan Note (Signed)
continue Fioricet not more than 3 times per week.  Refills given

## 2016-06-07 NOTE — Assessment & Plan Note (Signed)
Her pain and anxiety were optimally managed  when cymbalta was used daily  At 20 mg and lexapro at 10 mg daily .  Lodine did provide relief ,but because of her gastric bypass surgery L/t use of NSAIDs is contraindicated.   She is using a cannabinoid extract that she obtained in [place]. The dose is one drop daily

## 2016-06-07 NOTE — Assessment & Plan Note (Signed)
I have addressed  BMI and recommended wt loss of 10% of body weight over the next 6 months using a low fat, low starch, high protein  fruit/vegetable based Mediterranean diet and 30 minutes of aerobic exercise a minimum of 5 days per week.   

## 2016-06-21 ENCOUNTER — Other Ambulatory Visit: Payer: Self-pay | Admitting: Internal Medicine

## 2016-06-23 ENCOUNTER — Telehealth: Payer: Self-pay

## 2016-06-23 NOTE — Telephone Encounter (Signed)
Spoke with pt and informed her that her promethazine medication has been temporarily approved. Pt gave a verbal understanding.

## 2016-06-27 ENCOUNTER — Other Ambulatory Visit: Payer: Self-pay | Admitting: Internal Medicine

## 2016-09-07 ENCOUNTER — Ambulatory Visit (INDEPENDENT_AMBULATORY_CARE_PROVIDER_SITE_OTHER): Payer: Medicare Other | Admitting: Internal Medicine

## 2016-09-07 ENCOUNTER — Encounter: Payer: Self-pay | Admitting: Internal Medicine

## 2016-09-07 VITALS — BP 108/72 | HR 65 | Temp 98.1°F | Resp 15 | Ht 63.0 in | Wt 183.4 lb

## 2016-09-07 DIAGNOSIS — E538 Deficiency of other specified B group vitamins: Secondary | ICD-10-CM

## 2016-09-07 DIAGNOSIS — F411 Generalized anxiety disorder: Secondary | ICD-10-CM

## 2016-09-07 DIAGNOSIS — G2581 Restless legs syndrome: Secondary | ICD-10-CM

## 2016-09-07 DIAGNOSIS — Z148 Genetic carrier of other disease: Secondary | ICD-10-CM

## 2016-09-07 DIAGNOSIS — Z0000 Encounter for general adult medical examination without abnormal findings: Secondary | ICD-10-CM

## 2016-09-07 DIAGNOSIS — M810 Age-related osteoporosis without current pathological fracture: Secondary | ICD-10-CM

## 2016-09-07 DIAGNOSIS — G44229 Chronic tension-type headache, not intractable: Secondary | ICD-10-CM | POA: Diagnosis not present

## 2016-09-07 DIAGNOSIS — E669 Obesity, unspecified: Secondary | ICD-10-CM | POA: Diagnosis not present

## 2016-09-07 DIAGNOSIS — G894 Chronic pain syndrome: Secondary | ICD-10-CM | POA: Diagnosis not present

## 2016-09-07 DIAGNOSIS — E559 Vitamin D deficiency, unspecified: Secondary | ICD-10-CM

## 2016-09-07 LAB — LIPID PANEL
CHOL/HDL RATIO: 3
Cholesterol: 167 mg/dL (ref 0–200)
HDL: 61.3 mg/dL (ref >39.00)
LDL Cholesterol: 80 mg/dL (ref 0–99)
NonHDL: 105.92
Triglycerides: 130 mg/dL (ref 0.0–149.0)
VLDL: 26 mg/dL (ref 0.0–40.0)

## 2016-09-07 LAB — CBC WITH DIFFERENTIAL/PLATELET
Basophils Absolute: 0 10*3/uL (ref 0.0–0.1)
Basophils Relative: 0.7 % (ref 0.0–3.0)
EOS ABS: 0.5 10*3/uL (ref 0.0–0.7)
Eosinophils Relative: 11.2 % — ABNORMAL HIGH (ref 0.0–5.0)
HCT: 35.2 % — ABNORMAL LOW (ref 36.0–46.0)
Hemoglobin: 11.8 g/dL — ABNORMAL LOW (ref 12.0–15.0)
Lymphocytes Relative: 36.3 % (ref 12.0–46.0)
Lymphs Abs: 1.7 10*3/uL (ref 0.7–4.0)
MCHC: 33.6 g/dL (ref 30.0–36.0)
MCV: 95.7 fl (ref 78.0–100.0)
MONO ABS: 0.4 10*3/uL (ref 0.1–1.0)
Monocytes Relative: 8.2 % (ref 3.0–12.0)
NEUTROS ABS: 2 10*3/uL (ref 1.4–7.7)
NEUTROS PCT: 43.6 % (ref 43.0–77.0)
Platelets: 233 10*3/uL (ref 150.0–400.0)
RBC: 3.68 Mil/uL — ABNORMAL LOW (ref 3.87–5.11)
RDW: 13.4 % (ref 11.5–15.5)
WBC: 4.6 10*3/uL (ref 4.0–10.5)

## 2016-09-07 LAB — IBC PANEL
Iron: 94 ug/dL (ref 42–145)
Saturation Ratios: 25.7 % (ref 20.0–50.0)
Transferrin: 261 mg/dL (ref 212.0–360.0)

## 2016-09-07 LAB — TSH: TSH: 1.51 u[IU]/mL (ref 0.35–4.50)

## 2016-09-07 LAB — VITAMIN B12: Vitamin B-12: 263 pg/mL (ref 211–911)

## 2016-09-07 LAB — FERRITIN: Ferritin: 8.1 ng/mL — ABNORMAL LOW (ref 10.0–291.0)

## 2016-09-07 LAB — MAGNESIUM: Magnesium: 2.5 mg/dL (ref 1.5–2.5)

## 2016-09-07 LAB — VITAMIN D 25 HYDROXY (VIT D DEFICIENCY, FRACTURES): VITD: 26.49 ng/mL — ABNORMAL LOW (ref 30.00–100.00)

## 2016-09-07 MED ORDER — FLUTICASONE PROPIONATE 50 MCG/ACT NA SUSP: 2.0000 | Freq: Every day | NASAL | 6 refills | Status: DC

## 2016-09-07 MED ORDER — ESCITALOPRAM OXALATE 20 MG PO TABS: 20.0000 mg | ORAL_TABLET | Freq: Every day | ORAL | 2 refills | Status: DC

## 2016-09-07 NOTE — Patient Instructions (Signed)
I increased your Lexapro to 20 mg daily to help you manage your stress better.  You are starting to develop "the hump" in your upper back from poor posture  Please do the exercises I demonstrated to you today DAILY:  Roll your shoulder frontwards and backwards . 10 times ,  3 sets Add  2  lb wight to each hand when it gets easy   Practice getting up from a chiar WITHOUT USING YOUR HANDS  10 times  3 sets  Practice Standngi on one leg for 10 seconds, near the kitchen counter to work on your balance.  Try to use only one finger on the counter 5 times each leg   3 sets    Health Maintenance for Postmenopausal Women Menopause is a normal process in which your reproductive ability comes to an end. This process happens gradually over a span of months to years, usually between the ages of 68 and 55. Menopause is complete when you have missed 12 consecutive menstrual periods. It is important to talk with your health care provider about some of the most common conditions that affect postmenopausal women, such as heart disease, cancer, and bone loss (osteoporosis). Adopting a healthy lifestyle and getting preventive care can help to promote your health and wellness. Those actions can also lower your chances of developing some of these common conditions. What should I know about menopause? During menopause, you may experience a number of symptoms, such as:  Moderate-to-severe hot flashes.  Night sweats.  Decrease in sex drive.  Mood swings.  Headaches.  Tiredness.  Irritability.  Memory problems.  Insomnia. Choosing to treat or not to treat menopausal changes is an individual decision that you make with your health care provider. What should I know about hormone replacement therapy and supplements? Hormone therapy products are effective for treating symptoms that are associated with menopause, such as hot flashes and night sweats. Hormone replacement carries certain risks, especially as  you become older. If you are thinking about using estrogen or estrogen with progestin treatments, discuss the benefits and risks with your health care provider. What should I know about heart disease and stroke? Heart disease, heart attack, and stroke become more likely as you age. This may be due, in part, to the hormonal changes that your body experiences during menopause. These can affect how your body processes dietary fats, triglycerides, and cholesterol. Heart attack and stroke are both medical emergencies. There are many things that you can do to help prevent heart disease and stroke:  Have your blood pressure checked at least every 1-2 years. High blood pressure causes heart disease and increases the risk of stroke.  If you are 19-71 years old, ask your health care provider if you should take aspirin to prevent a heart attack or a stroke.  Do not use any tobacco products, including cigarettes, chewing tobacco, or electronic cigarettes. If you need help quitting, ask your health care provider.  It is important to eat a healthy diet and maintain a healthy weight.  Be sure to include plenty of vegetables, fruits, low-fat dairy products, and lean protein.  Avoid eating foods that are high in solid fats, added sugars, or salt (sodium).  Get regular exercise. This is one of the most important things that you can do for your health.  Try to exercise for at least 150 minutes each week. The type of exercise that you do should increase your heart rate and make you sweat. This is known as moderate-intensity  exercise.  Try to do strengthening exercises at least twice each week. Do these in addition to the moderate-intensity exercise.  Know your numbers.Ask your health care provider to check your cholesterol and your blood glucose. Continue to have your blood tested as directed by your health care provider. What should I know about cancer screening? There are several types of cancer. Take the  following steps to reduce your risk and to catch any cancer development as early as possible. Breast Cancer  Practice breast self-awareness.  This means understanding how your breasts normally appear and feel.  It also means doing regular breast self-exams. Let your health care provider know about any changes, no matter how small.  If you are 40 or older, have a clinician do a breast exam (clinical breast exam or CBE) every year. Depending on your age, family history, and medical history, it may be recommended that you also have a yearly breast X-ray (mammogram).  If you have a family history of breast cancer, talk with your health care provider about genetic screening.  If you are at high risk for breast cancer, talk with your health care provider about having an MRI and a mammogram every year.  Breast cancer (BRCA) gene test is recommended for women who have family members with BRCA-related cancers. Results of the assessment will determine the need for genetic counseling and BRCA1 and for BRCA2 testing. BRCA-related cancers include these types:  Breast. This occurs in males or females.  Ovarian.  Tubal. This may also be called fallopian tube cancer.  Cancer of the abdominal or pelvic lining (peritoneal cancer).  Prostate.  Pancreatic. Cervical, Uterine, and Ovarian Cancer  Your health care provider may recommend that you be screened regularly for cancer of the pelvic organs. These include your ovaries, uterus, and vagina. This screening involves a pelvic exam, which includes checking for microscopic changes to the surface of your cervix (Pap test).  For women ages 21-65, health care providers may recommend a pelvic exam and a Pap test every three years. For women ages 56-65, they may recommend the Pap test and pelvic exam, combined with testing for human papilloma virus (HPV), every five years. Some types of HPV increase your risk of cervical cancer. Testing for HPV may also be done  on women of any age who have unclear Pap test results.  Other health care providers may not recommend any screening for nonpregnant women who are considered low risk for pelvic cancer and have no symptoms. Ask your health care provider if a screening pelvic exam is right for you.  If you have had past treatment for cervical cancer or a condition that could lead to cancer, you need Pap tests and screening for cancer for at least 20 years after your treatment. If Pap tests have been discontinued for you, your risk factors (such as having a new sexual partner) need to be reassessed to determine if you should start having screenings again. Some women have medical problems that increase the chance of getting cervical cancer. In these cases, your health care provider may recommend that you have screening and Pap tests more often.  If you have a family history of uterine cancer or ovarian cancer, talk with your health care provider about genetic screening.  If you have vaginal bleeding after reaching menopause, tell your health care provider.  There are currently no reliable tests available to screen for ovarian cancer. Lung Cancer  Lung cancer screening is recommended for adults 38-82 years old who  are at high risk for lung cancer because of a history of smoking. A yearly low-dose CT scan of the lungs is recommended if you:  Currently smoke.  Have a history of at least 30 pack-years of smoking and you currently smoke or have quit within the past 15 years. A pack-year is smoking an average of one pack of cigarettes per day for one year. Yearly screening should:  Continue until it has been 15 years since you quit.  Stop if you develop a health problem that would prevent you from having lung cancer treatment. Colorectal Cancer  This type of cancer can be detected and can often be prevented.  Routine colorectal cancer screening usually begins at age 98 and continues through age 24.  If you have  risk factors for colon cancer, your health care provider may recommend that you be screened at an earlier age.  If you have a family history of colorectal cancer, talk with your health care provider about genetic screening.  Your health care provider may also recommend using home test kits to check for hidden blood in your stool.  A small camera at the end of a tube can be used to examine your colon directly (sigmoidoscopy or colonoscopy). This is done to check for the earliest forms of colorectal cancer.  Direct examination of the colon should be repeated every 5-10 years until age 16. However, if early forms of precancerous polyps or small growths are found or if you have a family history or genetic risk for colorectal cancer, you may need to be screened more often. Skin Cancer  Check your skin from head to toe regularly.  Monitor any moles. Be sure to tell your health care provider:  About any new moles or changes in moles, especially if there is a change in a mole's shape or color.  If you have a mole that is larger than the size of a pencil eraser.  If any of your family members has a history of skin cancer, especially at a young age, talk with your health care provider about genetic screening.  Always use sunscreen. Apply sunscreen liberally and repeatedly throughout the day.  Whenever you are outside, protect yourself by wearing long sleeves, pants, a wide-brimmed hat, and sunglasses. What should I know about osteoporosis? Osteoporosis is a condition in which bone destruction happens more quickly than new bone creation. After menopause, you may be at an increased risk for osteoporosis. To help prevent osteoporosis or the bone fractures that can happen because of osteoporosis, the following is recommended:  If you are 37-2 years old, get at least 1,000 mg of calcium and at least 600 mg of vitamin D per day.  If you are older than age 62 but younger than age 84, get at least 1,200  mg of calcium and at least 600 mg of vitamin D per day.  If you are older than age 61, get at least 1,200 mg of calcium and at least 800 mg of vitamin D per day. Smoking and excessive alcohol intake increase the risk of osteoporosis. Eat foods that are rich in calcium and vitamin D, and do weight-bearing exercises several times each week as directed by your health care provider. What should I know about how menopause affects my mental health? Depression may occur at any age, but it is more common as you become older. Common symptoms of depression include:  Low or sad mood.  Changes in sleep patterns.  Changes in appetite or eating  patterns.  Feeling an overall lack of motivation or enjoyment of activities that you previously enjoyed.  Frequent crying spells. Talk with your health care provider if you think that you are experiencing depression. What should I know about immunizations? It is important that you get and maintain your immunizations. These include:  Tetanus, diphtheria, and pertussis (Tdap) booster vaccine.  Influenza every year before the flu season begins.  Pneumonia vaccine.  Shingles vaccine. Your health care provider may also recommend other immunizations. This information is not intended to replace advice given to you by your health care provider. Make sure you discuss any questions you have with your health care provider. Document Released: 05/26/2005 Document Revised: 10/22/2015 Document Reviewed: 01/05/2015 [employer] Interactive Patient Education  2017 [employer].

## 2016-09-07 NOTE — Progress Notes (Signed)
Patient ID: Erin Aguilar, female    DOB: [birth year]  Age: 66 y.o. MRN: [id number]  The patient is here for annual CPE and management of other chronic and acute problems.  HEALTH MAINT REVIEWED:  Mammogram due July 2018 annually Colonoscopy 2017 dexa done 2016 Hep c screen done pvax 2016 tdap 2014  Fasting labs not done yet     The risk factors are reflected in the social history.  The roster of all physicians providing medical care to patient - is listed in the Snapshot section of the chart.  Activities of daily living:  The patient is 100% independent in all ADLs: dressing, toileting, feeding as well as independent mobility  Home safety : The patient has smoke detectors in the home. They wear seatbelts.  There are no firearms at home. There is no violence in the home.   There is no risks for hepatitis, STDs or HIV. There is no   history of blood transfusion. They have no travel history to infectious disease endemic areas of the world.  The patient has seen their dentist in the last six month. They have seen their eye doctor in the last year. They admit to slight hearing difficulty with regard to whispered voices and some television programs.  They have deferred audiologic testing in the last year.  They do not  have excessive sun exposure. Discussed the need for sun protection: hats, long sleeves and use of sunscreen if there is significant sun exposure.   Diet: the importance of a healthy diet is discussed. They do have a healthy diet.  The benefits of regular aerobic exercise were discussed. She walks 4 times per week ,  20 minutes.   Depression screen: there are some signs or vegative symptoms of depression- irritability, but no change in appetite, anhedonia, sadness/tearfullness.  Cognitive assessment: the patient manages all their financial and personal affairs and is actively engaged. They could relate day,date,year and events; recalled 2/3 objects at 3 minutes; performed  clock-face test normally.  The following portions of the patient's history were reviewed and updated as appropriate: allergies, current medications, past family history, past medical history,  past surgical history, past social history  and problem list.  Visual acuity was not assessed per patient preference since she has regular follow up with her ophthalmologist. Hearing and body mass index were assessed and reviewed.   During the course of the visit the patient was educated and counseled about appropriate screening and preventive services including : fall prevention , diabetes screening, nutrition counseling, colorectal cancer screening, and recommended immunizations.    CC: The primary encounter diagnosis was Encounter for preventive health examination. Diagnoses of Hemochromatosis carrier, Obesity (BMI 30.0-34.9), Vitamin D deficiency, B12 deficiency, Chronic tension-type headache, not intractable, Chronic pain syndrome, Anxiety state, Osteoporosis without current pathological fracture, unspecified osteoporosis type, and Restless legs syndrome were also pertinent to this visit. aking an MVI with iron and B12  Taking vit D supplement  2000 daily and oscal plus D once daily  Taking mirapex for RLS  Chronically   Has episodes of extreme frustration and irritability brought on by encountering even  minor issues assocated with awareness of husband's continued perceived decline, and the burden of assisting in the daily care of his two older sisters, as well as the periodic care of her two nephews who are grown but intellectually disabled.  (husband not hearing well,  Not functioniong well) day   Still having arthritis pain worse in cervical spine and hands,  hemp oil not working as well as it initially did (reported at last visit as significantly better at managing daily chronic pain )  Averaging use of fioriicet 2-3 times per week  Not exercising due to persistent foot pain from plantar fasciitis,   Has had  injections from [facility] x 2.  Now wearing inserts from [employer]" that cost $960 for a set of 3  Pairs of inserts. Feels they are helping her back as well.    History Erin Aguilar has a past medical history of Allergy; Arthritis; Chicken pox; Colon polyp; Diverticulitis; GERD (gastroesophageal reflux disease); H/O: pertussis; Headache(784.0); Hemochromatosis carrier; Hemorrhoids, internal, with bleeding; Hyperlipidemia; Migraines; and UTI (lower urinary tract infection).   She has a past surgical history that includes Cholecystectomy (1976); Abdominal hysterectomy (1988); Gastric bypass (03/2010); Hernia repair (2008); and Joint replacement (Bilateral, 02/26/2014).   Her family history includes Arthritis in her father, maternal grandfather, maternal grandmother, and mother; Cancer in her brother and mother; Cancer (age of onset: 28) in her sister; Diabetes in her maternal grandfather and maternal grandmother; Early death (age of onset: 35) in her mother; Heart disease in her maternal grandfather and maternal grandmother; Kidney disease in her brother; Stroke in her father.She reports that she quit smoking about 29 years ago. She has never used smokeless tobacco. She reports that she drinks about 1.5 oz of alcohol per week . She reports that she does not use drugs.  Outpatient Medications Prior to Visit  Medication Sig Dispense Refill  . butalbital-acetaminophen-caffeine (FIORICET WITH CODEINE) 50-325-40-30 MG capsule TAKE 1 CAPSULE BY MOUTH EVERY 6 HOURS AS NEEDED for pain 60 capsule 5  . calcium-vitamin D (OSCAL) 250-125 MG-UNIT per tablet Take 1 tablet by mouth daily.    . cetirizine (ZYRTEC) 10 MG tablet Take 10 mg by mouth daily.    . Cholecalciferol (VITAMIN D3) 2000 UNITS capsule Take 2,000 Units by mouth daily.     . DULoxetine (CYMBALTA) 20 MG capsule TAKE (1) CAPSULE BY MOUTH ONCE DAILY. 90 capsule 0  . esomeprazole (NEXIUM) 40 MG capsule Take 1 capsule (40 mg total) by mouth daily  before breakfast. 30 capsule 6  . Multiple Vitamin (MULTIVITAMIN) tablet Take 1 tablet by mouth daily.    . pramipexole (MIRAPEX) 0.25 MG tablet TAKE ONE TABLET BY MOUTH AT BEDTIME. 90 tablet 0  . promethazine (PHENERGAN) 25 MG tablet TAKE (1) TABLET BY MOUTH EVERY SIX HOURS AS NEEDED. 30 tablet 3  . UNABLE TO FIND Hemp Cannabinoid extract 1/2 dropper sublingual once daily for arthritis pain.    Marland Kitchen alendronate (FOSAMAX) 70 MG tablet TAKE (1) ONCE A WEEK 30 MINUTES BEFORE BREAKFAST WITH FULL GLASS OF WATER. DO NOT LIE DOWN AFTER. 12 tablet 0  . escitalopram (LEXAPRO) 10 MG tablet TAKE 1 TABLET BY MOUTH ONCE DAILY. 90 tablet 0   Facility-Administered Medications Prior to Visit  Medication Dose Route Frequency Provider Last Rate Last Dose  . 0.9 %  sodium chloride infusion  500 mL Intravenous Continuous Danis, Starr Lake III, MD        Review of Systems   Patient denies headache, fevers, malaise, unintentional weight loss, skin rash, eye pain, sinus congestion and sinus pain, sore throat, dysphagia,  hemoptysis , cough, dyspnea, wheezing, chest pain, palpitations, orthopnea, edema, abdominal pain, nausea, melena, diarrhea, constipation, flank pain, dysuria, hematuria, urinary  Frequency, nocturia, numbness, tingling, seizures,  Focal weakness, Loss of consciousness,  Tremor, insomnia, depression, anxiety, and suicidal ideation.      Objective:  BP 108/72 (BP Location: Left Arm, Patient Position: Sitting, Cuff Size: Large)   Pulse 65   Temp 98.1 F (36.7 C) (Oral)   Resp 15   Ht 5\' 3"  (1.6 m)   Wt 183 lb 6.4 oz (83.2 kg)   SpO2 97%   BMI 32.49 kg/m   Physical Exam   General appearance: alert, cooperative and appears stated age Head: Normocephalic, without obvious abnormality, atraumatic Eyes: conjunctivae/corneas clear. PERRL, EOM's intact. Fundi benign. Ears: normal TM's and external ear canals both ears Nose: Nares normal. Septum midline. Mucosa normal. No drainage or sinus  tenderness. Throat: lips, mucosa, and tongue normal; teeth and gums normal Neck: no adenopathy, no carotid bruit, no JVD, supple, symmetrical, trachea midline and thyroid not enlarged, symmetric, no tenderness/mass/nodules Lungs: clear to auscultation bilaterally Breasts: normal appearance, no masses or tenderness Heart: regular rate and rhythm, S1, S2 normal, no murmur, click, rub or gallop Abdomen: soft, non-tender; bowel sounds normal; no masses,  no organomegaly Extremities: extremities normal, atraumatic, no cyanosis or edema Pulses: 2+ and symmetric Skin: Skin color, texture, turgor normal. No rashes or lesions Neurologic: Alert and oriented X 3, normal strength and tone. Normal symmetric reflexes. Normal coordination and gait.   Psych: affect normal, makes good eye contact. No fidgeting,  Tearful, but Smiles easily.  Denies suicidal thoughts     Assessment & Plan:   Problem List Items Addressed This Visit    Osteoporosis    She has had a progressive loss of bone density with T scores now  -2.9  In the spine by 2016 DEXA . Continue   Calcium and Vitamin D supplements and alendronate checking vitamin d level today       Relevant Medications   alendronate (FOSAMAX) 70 MG tablet   Obesity (BMI 30.0-34.9)    I have addressed  BMI and recommended wt loss of 10% of body weight over the next 6 months using a low fat, low starch, high protein  fruit/vegetable based Mediterranean diet and 30 minutes of aerobic exercise a minimum of 5 days per week.   Lab Results  Component Value Date   CHOL 167 09/07/2016   HDL 61.30 09/07/2016   LDLCALC 80 09/07/2016   LDLDIRECT 106.0 06/10/2012   TRIG 130.0 09/07/2016   CHOLHDL 3 09/07/2016         Relevant Orders   TSH (Completed)   Lipid panel (Completed)   Iron deficiency anemia due to dietary causes     history of bariatric surgery and hemachromatosis carrier,  Checking iron stores, and she appears to be iron deficient.   Lab Results   Component Value Date   IRON 94 09/07/2016   TIBC 291 07/15/2012   FERRITIN 8.1 (L) 09/07/2016   Lab Results  Component Value Date   WBC 4.6 09/07/2016   HGB 11.8 (L) 09/07/2016   HCT 35.2 (L) 09/07/2016   MCV 95.7 09/07/2016   PLT 233.0 09/07/2016          Relevant Medications   FeFum-FePo-FA-B Cmp-C-Zn-Mn-Cu (TANDEM PLUS) 162-115.2-1 MG CAPS   cyanocobalamin (,VITAMIN B-12,) 1000 MCG/ML injection   Hemochromatosis carrier   Relevant Orders   Ferritin (Completed)   CBC with Differential/Platelet (Completed)   Iron   IBC panel (Completed)   Encounter for preventive health examination - Primary    Annual comprehensive preventive exam was done as well as an evaluation and management of chronic conditions .  During the course of the visit the patient was educated and counseled  about appropriate screening and preventive services including :  diabetes screening, lipid analysis with projected  10 year  risk for CAD , nutrition counseling, breast, cervical and colorectal cancer screening, and recommended immunizations.  Printed recommendations for health maintenance screenings was given      Chronic pain syndrome    Her pain and anxiety were optimally managed  when cymbalta was used daily  At 20 mg and lexapro at 10 mg daily .  Lodine did provide relief ,but because of her gastric bypass surgery L/t use of NSAIDs is contraindicated.   She is using a cannabinoid extract that she obtained in [place] but it no longer seems to be effective.  Her anxiety appears to be problematic to her today,  So we increased her lexapro to 20 mg daily        Chronic headache disorder    Managed with Fioricet not taken more than 3 times per week.  Refills given       Relevant Medications   escitalopram (LEXAPRO) 20 MG tablet   B12 deficiency    Secondary to bariatric surgery.  Borderlline low, will increase supplementation to monthly IM injections.   Lab Results  Component Value Date    VITAMINB12 263 09/07/2016         Relevant Orders   Vitamin B12 (Completed)   Anxiety state    Long discussion about source of anxiety,  Appears to be secondary to persistent concern  about her husband's perceived decline in health and the added stress of care of his sisters .   Advised to avoid use of benzodiazepines. Increasing  lexapro today to 20 mg.       Relevant Medications   escitalopram (LEXAPRO) 20 MG tablet    Other Visit Diagnoses    Vitamin D deficiency       Relevant Orders   Magnesium (Completed)   VITAMIN D 25 Hydroxy (Vit-D Deficiency, Fractures) (Completed)      I have changed Ms. Skoog's escitalopram. I am also having her start on fluticasone, TANDEM PLUS, cyanocobalamin, and SYRINGE 3CC/25GX1". Additionally, I am having her maintain her multivitamin, cetirizine, calcium-vitamin D, Vitamin D3, esomeprazole, promethazine, UNABLE TO FIND, butalbital-acetaminophen-caffeine, pramipexole, DULoxetine, and alendronate. We will continue to administer sodium chloride.  Meds ordered this encounter  Medications  . escitalopram (LEXAPRO) 20 MG tablet    Sig: Take 1 tablet (20 mg total) by mouth daily.    Dispense:  30 tablet    Refill:  2  . fluticasone (FLONASE) 50 MCG/ACT nasal spray    Sig: Place 2 sprays into both nostrils daily.    Dispense:  16 g    Refill:  6  . alendronate (FOSAMAX) 70 MG tablet    Sig: TAKE (1) ONCE A WEEK 30 MINUTES BEFORE BREAKFAST WITH FULL GLASS OF WATER. DO NOT LIE DOWN AFTER.    Dispense:  12 tablet    Refill:  3  . FeFum-FePo-FA-B Cmp-C-Zn-Mn-Cu (TANDEM PLUS) 162-115.2-1 MG CAPS    Sig: Take 1 capsule by mouth daily.    Dispense:  30 each    Refill:  5  . cyanocobalamin (,VITAMIN B-12,) 1000 MCG/ML injection    Sig: Inject 1 mL (1,000 mcg total) into the muscle once a week. X 4, THEN MONTHLY    Dispense:  10 mL    Refill:  03  . Syringe/Needle, Disp, (SYRINGE 3CC/25GX1") 25G X 1" 3 ML MISC    Sig: Use for b12 injections  Dispense:  50 each    Refill:  0    Medications Discontinued During This Encounter  Medication Reason  . escitalopram (LEXAPRO) 10 MG tablet Reorder  . alendronate (FOSAMAX) 70 MG tablet Reorder    Follow-up: No Follow-up on file.   Sherlene Shams, MD

## 2016-09-09 DIAGNOSIS — E538 Deficiency of other specified B group vitamins: Secondary | ICD-10-CM | POA: Insufficient documentation

## 2016-09-09 MED ORDER — ALENDRONATE SODIUM 70 MG PO TABS: ORAL_TABLET | ORAL | 3 refills | Status: DC

## 2016-09-09 MED ORDER — "SYRINGE 25G X 1"" 3 ML MISC": 0 refills | Status: DC

## 2016-09-09 MED ORDER — TANDEM PLUS 162-115.2-1 MG PO CAPS: 1.0000 | ORAL_CAPSULE | Freq: Every day | ORAL | 5 refills | Status: DC

## 2016-09-09 MED ORDER — CYANOCOBALAMIN 1000 MCG/ML IJ SOLN: 1000.0000 ug | INTRAMUSCULAR | Status: DC

## 2016-09-09 NOTE — Assessment & Plan Note (Signed)
Secondary to bariatric surgery.  Borderlline low, will increase supplementation to monthly IM injections.   Lab Results  Component Value Date   VITAMINB12 263 09/07/2016

## 2016-09-09 NOTE — Assessment & Plan Note (Addendum)
history of bariatric surgery and hemachromatosis carrier,  Checking iron stores, and she appears to be iron deficient.   Lab Results  Component Value Date   IRON 94 09/07/2016   TIBC 291 07/15/2012   FERRITIN 8.1 (L) 09/07/2016   Lab Results  Component Value Date   WBC 4.6 09/07/2016   HGB 11.8 (L) 09/07/2016   HCT 35.2 (L) 09/07/2016   MCV 95.7 09/07/2016   PLT 233.0 09/07/2016

## 2016-09-09 NOTE — Assessment & Plan Note (Signed)
Annual comprehensive preventive exam was done as well as an evaluation and management of chronic conditions .  During the course of the visit the patient was educated and counseled about appropriate screening and preventive services including :  diabetes screening, lipid analysis with projected  10 year  risk for CAD , nutrition counseling, breast, cervical and colorectal cancer screening, and recommended immunizations.  Printed recommendations for health maintenance screenings was given 

## 2016-09-09 NOTE — Assessment & Plan Note (Signed)
Managed with Fioricet not taken more than 3 times per week.  Refills given

## 2016-09-09 NOTE — Assessment & Plan Note (Signed)
Long discussion about source of anxiety,  Appears to be secondary to persistent concern  about her husband's perceived decline in health and the added stress of care of his sisters .   Advised to avoid use of benzodiazepines. Increasing  lexapro today to 20 mg.

## 2016-09-09 NOTE — Assessment & Plan Note (Signed)
Her pain and anxiety were optimally managed  when cymbalta was used daily  At 20 mg and lexapro at 10 mg daily .  Lodine did provide relief ,but because of her gastric bypass surgery L/t use of NSAIDs is contraindicated.   She is using a cannabinoid extract that she obtained in [place] but it no longer seems to be effective.  Her anxiety appears to be problematic to her today,  So we increased her lexapro to 20 mg daily

## 2016-09-09 NOTE — Assessment & Plan Note (Addendum)
I have addressed  BMI and recommended wt loss of 10% of body weight over the next 6 months using a low fat, low starch, high protein  fruit/vegetable based Mediterranean diet and 30 minutes of aerobic exercise a minimum of 5 days per week.   Lab Results  Component Value Date   CHOL 167 09/07/2016   HDL 61.30 09/07/2016   LDLCALC 80 09/07/2016   LDLDIRECT 106.0 06/10/2012   TRIG 130.0 09/07/2016   CHOLHDL 3 09/07/2016

## 2016-09-09 NOTE — Assessment & Plan Note (Signed)
She has had a progressive loss of bone density with T scores now  -2.9  In the spine by 2016 DEXA . Continue   Calcium and Vitamin D supplements and alendronate checking vitamin d level today

## 2016-09-14 ENCOUNTER — Other Ambulatory Visit: Payer: Self-pay | Admitting: Internal Medicine

## 2016-10-03 ENCOUNTER — Other Ambulatory Visit: Payer: Self-pay | Admitting: Internal Medicine

## 2016-10-16 ENCOUNTER — Other Ambulatory Visit: Payer: Self-pay | Admitting: Internal Medicine

## 2016-10-16 DIAGNOSIS — Z1231 Encounter for screening mammogram for malignant neoplasm of breast: Secondary | ICD-10-CM

## 2016-10-19 ENCOUNTER — Encounter: Payer: Self-pay | Admitting: Family Medicine

## 2016-10-19 ENCOUNTER — Ambulatory Visit (INDEPENDENT_AMBULATORY_CARE_PROVIDER_SITE_OTHER): Payer: Medicare Other

## 2016-10-19 ENCOUNTER — Ambulatory Visit (INDEPENDENT_AMBULATORY_CARE_PROVIDER_SITE_OTHER): Payer: Medicare Other | Admitting: Family Medicine

## 2016-10-19 VITALS — BP 118/76 | HR 64 | Temp 98.6°F | Resp 12 | Ht 62.5 in | Wt 184.2 lb

## 2016-10-19 DIAGNOSIS — G8929 Other chronic pain: Secondary | ICD-10-CM | POA: Diagnosis not present

## 2016-10-19 DIAGNOSIS — M545 Low back pain, unspecified: Secondary | ICD-10-CM | POA: Insufficient documentation

## 2016-10-19 DIAGNOSIS — M546 Pain in thoracic spine: Secondary | ICD-10-CM

## 2016-10-19 MED ORDER — TRAMADOL HCL 50 MG PO TABS: 50.0000 mg | ORAL_TABLET | Freq: Three times a day (TID) | ORAL | 0 refills | Status: DC | PRN

## 2016-10-19 NOTE — Progress Notes (Signed)
Subjective:  Patient ID: Erin Aguilar, female    DOB: 10-24-1950  Age: 66 y.o. MRN: [id number]  CC: Back pain  HPI:  66 year old female with a history of chronic pain presents with complaints of back pain.  Patient reports that she has had midline thoracic pain for years. She is also had low back pain for years as well. She states that her pain has been worsening over the past several months. She states that it has been worse since the beginning of the year. Worse with activity. Improves with rest. Pain is moderate to severe. Improves with anti-inflammatories. No recent fall, trauma, injury. No other associated symptoms. No other complaints this time.  Social Hx   Social History   Social History  . Marital status: Married    Spouse name: N/A  . Number of children: 3  . Years of education: N/A   Occupational History  . retired    Social History Main Topics  . Smoking status: Former Smoker    Quit date: 12/08/1986  . Smokeless tobacco: Never Used  . Alcohol use 1.5 oz/week    3 Standard drinks or equivalent per week  . Drug use: No  . Sexual activity: Not Asked   Other Topics Concern  . None   Social History Narrative  . None    Review of Systems  Constitutional: Negative.   Musculoskeletal: Positive for back pain.   Objective:  BP 118/76 (BP Location: Left Arm, Patient Position: Sitting, Cuff Size: Large)   Pulse 64   Temp 98.6 F (37 C) (Oral)   Resp 12   Ht 5' 2.5" (1.588 m)   Wt 184 lb 4 oz (83.6 kg)   SpO2 98%   BMI 33.16 kg/m   BP/Weight 10/19/2016 09/07/2016 06/06/2016  Systolic BP 118 108 114  Diastolic BP 76 72 72  Wt. (Lbs) 184.25 183.4 184  BMI 33.16 32.49 32.59   Physical Exam  Constitutional: She appears well-developed. No distress.  Cardiovascular: Normal rate and regular rhythm.   2/6 systolic murmur.   Pulmonary/Chest: Effort normal. She has no wheezes. She has no rales.  Musculoskeletal:  Thoracic spine - midline tenderness noted in  the middle of the thoracic spine between the scapula.  Lumbar spine - left paraspinal muscular tenderness to palpation. Negative straight leg raise.  Neurological: She is alert.  Psychiatric: She has a normal mood and affect.  Vitals reviewed.   Lab Results  Component Value Date   WBC 4.6 09/07/2016   HGB 11.8 (L) 09/07/2016   HCT 35.2 (L) 09/07/2016   PLT 233.0 09/07/2016   GLUCOSE 94 09/06/2015   CHOL 167 09/07/2016   TRIG 130.0 09/07/2016   HDL 61.30 09/07/2016   LDLDIRECT 106.0 06/10/2012   LDLCALC 80 09/07/2016   ALT 14 09/06/2015   AST 15 09/06/2015   NA 141 09/06/2015   K 4.2 09/06/2015   CL 108 09/06/2015   CREATININE 0.65 09/06/2015   BUN 14 09/06/2015   CO2 27 09/06/2015   TSH 1.51 09/07/2016   HGBA1C 5.7 12/14/2014    Assessment & Plan:   Problem List Items Addressed This Visit      Other   Chronic left-sided low back pain    Established problem with recent worsening. Pain radiates to the left hip. Negative straight leg raise. Imaging today for further evaluation. Tramadol as needed.       Relevant Medications   traMADol (ULTRAM) 50 MG tablet   Other Relevant Orders  DG Lumbar Spine 2-3 Views   Chronic midline thoracic back pain - Primary    Established problem with recent worsening. X-ray today. Trial of tramadol.      Relevant Medications   traMADol (ULTRAM) 50 MG tablet   Other Relevant Orders   DG Thoracic Spine 2 View     Meds ordered this encounter  Medications  . traMADol (ULTRAM) 50 MG tablet    Sig: Take 1 tablet (50 mg total) by mouth every 8 (eight) hours as needed.    Dispense:  30 tablet    Refill:  0   Follow-up: PRN  Everlene Other DO [facility]

## 2016-10-19 NOTE — Patient Instructions (Signed)
Tramadol for pain.  We will call with the xray results.  Take care  Dr. Adriana Simasook

## 2016-10-19 NOTE — Assessment & Plan Note (Signed)
Established problem with recent worsening. Pain radiates to the left hip. Negative straight leg raise. Imaging today for further evaluation. Tramadol as needed.

## 2016-10-19 NOTE — Assessment & Plan Note (Signed)
Established problem with recent worsening. X-ray today. Trial of tramadol.

## 2016-11-06 ENCOUNTER — Ambulatory Visit
Admission: RE | Admit: 2016-11-06 | Discharge: 2016-11-06 | Disposition: A | Discharge status: Home / Self Care | Payer: Medicare Other | Source: Ambulatory Visit | Attending: Internal Medicine | Admitting: Internal Medicine

## 2016-11-06 ENCOUNTER — Inpatient Hospital Stay: Admission: RE | Admit: 2016-11-06 | Payer: Medicare Other | Source: Ambulatory Visit

## 2016-11-06 DIAGNOSIS — Z1231 Encounter for screening mammogram for malignant neoplasm of breast: Secondary | ICD-10-CM | POA: Insufficient documentation

## 2016-11-25 ENCOUNTER — Other Ambulatory Visit: Payer: Self-pay | Admitting: Internal Medicine

## 2016-11-28 ENCOUNTER — Telehealth: Payer: Self-pay | Admitting: Internal Medicine

## 2016-11-28 NOTE — Telephone Encounter (Signed)
Left pt message asking to call Erin Aguilar back directly at [phone] to schedule AWV. Thanks!  *NOTE* Last AWV 09/07/15

## 2016-12-06 ENCOUNTER — Ambulatory Visit (INDEPENDENT_AMBULATORY_CARE_PROVIDER_SITE_OTHER): Payer: Medicare Other

## 2016-12-06 VITALS — BP 112/70 | HR 58 | Temp 98.4°F | Resp 14 | Ht 62.5 in | Wt 183.1 lb

## 2016-12-06 DIAGNOSIS — Z1389 Encounter for screening for other disorder: Secondary | ICD-10-CM

## 2016-12-06 DIAGNOSIS — Z1331 Encounter for screening for depression: Secondary | ICD-10-CM

## 2016-12-06 DIAGNOSIS — Z0000 Encounter for general adult medical examination without abnormal findings: Secondary | ICD-10-CM

## 2016-12-06 DIAGNOSIS — Z23 Encounter for immunization: Secondary | ICD-10-CM | POA: Diagnosis not present

## 2016-12-06 NOTE — Patient Instructions (Addendum)
  Ms. Swink , Thank you for taking time to come for your Medicare Wellness Visit. I appreciate your ongoing commitment to your health goals. Please review the following plan we discussed and let me know if I can assist you in the future.   Follow up with Dr. Darrick Huntsman as needed.    Bring a copy of your Health Care Power of Attorney and/or Living Will to be scanned into chart.  Have a great day!  These are the goals we discussed: Goals    . Healthy Lifestyle          Low carb foods Stay active and continue walking for exercise Stay hydrated       This is a list of the screening recommended for you and due dates:  Health Maintenance  Topic Date Due  . Mammogram  11/07/2018  . Tetanus Vaccine  01/23/2023  . Colon Cancer Screening  12/28/2025  . Flu Shot  Completed  . DEXA scan (bone density measurement)  Completed  .  Hepatitis C: One time screening is recommended by Center for Disease Control  (CDC) for  adults born from [birth year] through 1967.   Completed  . Pneumonia vaccines  Completed

## 2016-12-06 NOTE — Progress Notes (Signed)
Subjective:   Erin Aguilar is a 66 y.o. female who presents for Medicare Annual (Subsequent) preventive examination.  Review of Systems:  No ROS.  Medicare Wellness Visit. Additional risk factors are reflected in the social history.  Cardiac Risk Factors include: advanced age (>8men, >47 women);obesity (BMI >30kg/m2)     Objective:     Vitals: BP 112/70 (BP Location: Left Arm, Patient Position: Sitting, Cuff Size: Normal)   Pulse (!) 58   Temp 98.4 F (36.9 C) (Oral)   Resp 14   Ht 5' 2.5" (1.588 m)   Wt 183 lb 1.9 oz (83.1 kg)   SpO2 97%   BMI 32.96 kg/m   Body mass index is 32.96 kg/m.   Tobacco History  Smoking Status  . Former Smoker  . Quit date: 12/08/1986  Smokeless Tobacco  . Never Used     Counseling given: Not Answered   Past Medical History:  Diagnosis Date  . Allergy    Hay fever  . Arthritis   . Chicken pox   . Colon polyp   . Diverticulitis   . GERD (gastroesophageal reflux disease)   . H/O: pertussis   . Headache(784.0)   . 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 Hemorrhoids, internal, with bleeding    occasional  . Hyperlipidemia   . Migraines   . UTI (lower urinary tract infection)    Past Surgical History:  Procedure Laterality Date  . ABDOMINAL HYSTERECTOMY  1988  . CHOLECYSTECTOMY  1976  . GASTRIC BYPASS  03/2010  . HERNIA REPAIR  2008   ventral ,  from scar tissue, [place]  . JOINT REPLACEMENT Bilateral 02/26/2014   Dr. Howell Rucks   Family History  Problem Relation Age of Onset  . Arthritis Mother        rheumatoid   . Cancer Mother        lymphoma  . Early death Mother 36       lymphoma  . Arthritis Father   . Stroke Father   . Cancer Sister 28       cervical ca  . Kidney disease Brother   . Cancer Brother        lung ca,  smoker  . Arthritis Maternal Grandmother   . Heart disease Maternal Grandmother   . Diabetes Maternal Grandmother   . Arthritis Maternal Grandfather   . Heart disease  Maternal Grandfather   . Diabetes Maternal Grandfather   . Breast cancer Neg Hx    History  Sexual Activity  . Sexual activity: Not on file    Outpatient Encounter Prescriptions as of 12/06/2016  Medication Sig  . alendronate (FOSAMAX) 70 MG tablet TAKE (1) ONCE A WEEK 30 MINUTES BEFORE BREAKFAST WITH FULL GLASS OF WATER. DO NOT LIE DOWN AFTER.  . butalbital-acetaminophen-caffeine (FIORICET WITH CODEINE) 50-325-40-30 MG capsule TAKE 1 CAPSULE BY MOUTH EVERY 6 HOURS AS NEEDED for pain  . calcium-vitamin D (OSCAL) 250-125 MG-UNIT per tablet Take 1 tablet by mouth daily.  . cetirizine (ZYRTEC) 10 MG tablet Take 10 mg by mouth daily.  . Cholecalciferol (VITAMIN D3) 2000 UNITS capsule Take 2,000 Units by mouth daily.   . cyanocobalamin (,VITAMIN B-12,) 1000 MCG/ML injection Inject 1 mL (1,000 mcg total) into the muscle once a week. X 4, THEN MONTHLY  . DULoxetine (CYMBALTA) 20 MG capsule TAKE (1) CAPSULE BY MOUTH ONCE DAILY.  Marland Kitchen escitalopram (LEXAPRO) 20 MG tablet TAKE 1 TABLET BY MOUTH ONCE DAILY.  Marland Kitchen  esomeprazole (NEXIUM) 40 MG capsule TAKE 1 CAPSULE IN THE MORNING BEFORE BREAKFAST.  Marland Kitchen FeFum-FePo-FA-B Cmp-C-Zn-Mn-Cu (TANDEM PLUS) 162-115.2-1 MG CAPS Take 1 capsule by mouth daily.  . fluticasone (FLONASE) 50 MCG/ACT nasal spray Place 2 sprays into both nostrils daily.  . Multiple Vitamin (MULTIVITAMIN) tablet Take 1 tablet by mouth daily.  . pramipexole (MIRAPEX) 0.25 MG tablet TAKE ONE TABLET BY MOUTH AT BEDTIME.  . promethazine (PHENERGAN) 25 MG tablet TAKE (1) TABLET BY MOUTH EVERY SIX HOURS AS NEEDED.  Marland Kitchen Syringe/Needle, Disp, (SYRINGE 3CC/25GX1") 25G X 1" 3 ML MISC Use for b12 injections  . traMADol (ULTRAM) 50 MG tablet Take 1 tablet (50 mg total) by mouth every 8 (eight) hours as needed.  Marland Kitchen UNABLE TO FIND Hemp Cannabinoid extract 1/2 dropper sublingual once daily for arthritis pain.   Facility-Administered Encounter Medications as of 12/06/2016  Medication  . 0.9 %  sodium chloride  infusion    Activities of Daily Living In your present state of health, do you have any difficulty performing the following activities: 12/06/2016  Hearing? N  Vision? N  Difficulty concentrating or making decisions? N  Walking or climbing stairs? N  Dressing or bathing? N  Doing errands, shopping? N  Preparing Food and eating ? N  Using the Toilet? N  In the past six months, have you accidently leaked urine? N  Do you have problems with loss of bowel control? N  Managing your Medications? N  Managing your Finances? N  Housekeeping or managing your Housekeeping? N  Some recent data might be hidden    Patient Care Team: Sherlene Shams, MD as PCP - General (Internal Medicine)    Assessment:    This is a routine wellness examination for Erin Aguilar. The goal of the wellness visit is to assist the patient how to close the gaps in care and create a preventative care plan for the patient.   The roster of all physicians providing medical care to patient is listed in the Snapshot section of the chart.  Taking calcium VIT D as appropriate/Osteoporosis reviewed.    Safety issues reviewed; Smoke and carbon monoxide detectors in the home. No firearms in the home.  Wears seatbelts when driving or riding with others. Patient does wear sunscreen or protective clothing when in direct sunlight. No violence in the home.  Depression- PHQ 2 &9 complete.  No signs/symptoms or verbal communication regarding little pleasure in doing things, feeling down, depressed or hopeless. No changes in sleeping, energy, eating, concentrating.  No thoughts of self harm or harm towards others.  Time spent on this topic is 8 minutes.   Patient is alert, normal appearance, oriented to person/place/and time.  Correctly identified the president of the [place], recall of 3/3 words, and performing simple calculations. Displays appropriate judgement and can read correct time from watch face.   No new identified risk were  noted.  No failures at ADL's or IADL's.   BMI- discussed the importance of a healthy diet, water intake and the benefits of aerobic exercise. Educational material provided.   24 hour diet recall: Low carb foods  Dental- every 6 months.  Eye- Visual acuity not assessed per patient preference since they have regular follow up with the ophthalmologist.  Wears corrective lenses.  Sleep patterns- Sleeps  hours at night.  Wakes feeling rested.  Naps during the day.  High dose influenza administered R deltoid, tolerated well. Educational material provided.  Health maintenance gaps- closed.  Patient Concerns: None at this  time. Follow up with PCP as needed.  Exercise Activities and Dietary recommendations Current Exercise Habits: Home exercise routine, Type of exercise: walking;stretching, Time (Minutes): 20, Frequency (Times/Week): 6, Weekly Exercise (Minutes/Week): 120, Intensity: Mild  Goals    . Healthy Lifestyle          Low carb foods Stay active and continue walking for exercise Stay hydrated      Fall Risk Fall Risk  12/06/2016 11/29/2015 07/27/2014 06/10/2012  Falls in the past year? No No No No   Depression Screen PHQ 2/9 Scores 12/06/2016 11/29/2015 07/27/2014 06/10/2012  PHQ - 2 Score 0 0 0 2  PHQ- 9 Score 0 - - 3     Cognitive Function MMSE - Mini Mental State Exam 12/06/2016  Orientation to time 5  Orientation to Place 5  Registration 3  Attention/ Calculation 5  Recall 3  Language- repeat 1  Language- read & follow direction 1  Copy design 1        Immunization History  Administered Date(s) Administered  . Hepatitis A, Adult 02/20/2013  . Hepatitis B 07/24/1994, 08/23/1994, 01/23/1995  . Influenza, High Dose Seasonal PF 01/12/2016, 12/06/2016  . Influenza,inj,Quad PF,6+ Mos 12/14/2014  . Influenza-Unspecified 01/21/2013  . Pneumococcal Conjugate-13 01/22/2013  . Pneumococcal Polysaccharide-23 07/27/2014  . Tdap 01/22/2013  . Zoster 01/23/2012    Screening Tests Health Maintenance  Topic Date Due  . MAMMOGRAM  11/07/2018  . TETANUS/TDAP  01/23/2023  . COLONOSCOPY  12/28/2025  . INFLUENZA VACCINE  Completed  . DEXA SCAN  Completed  . Hepatitis C Screening  Completed  . PNA vac Low Risk Adult  Completed      Plan:    End of life planning; Advance aging; Advanced directives discussed. Copy of current HCPOA/Living Will requested.    I have personally reviewed and noted the following in the patient's chart:   . Medical and social history . Use of alcohol, tobacco or illicit drugs  . Current medications and supplements . Functional ability and status . Nutritional status . Physical activity . Advanced directives . List of other physicians . Hospitalizations, surgeries, and ER visits in previous 12 months . Vitals . Screenings to include cognitive, depression, and falls . Referrals and appointments  In addition, I have reviewed and discussed with patient certain preventive protocols, quality metrics, and best practice recommendations. A written personalized care plan for preventive services as well as general preventive health recommendations were provided to patient.     OBrien-Blaney, Samhitha Rosen L, LPN  [id number]/63/[id number]     I have reviewed the above information and agree with above.   Duncan Dull, MD

## 2016-12-14 IMAGING — MG MM DIGITAL SCREENING BILAT W/ TOMO W/ CAD
9 of 16 series · 9 of 36 positions shown · non-contrast
Comparison: Previous exam(s).

CLINICAL DATA: Screening.

EXAM:
2D DIGITAL SCREENING BILATERAL MAMMOGRAM WITH CAD AND ADJUNCT TOMO

[L MLO]
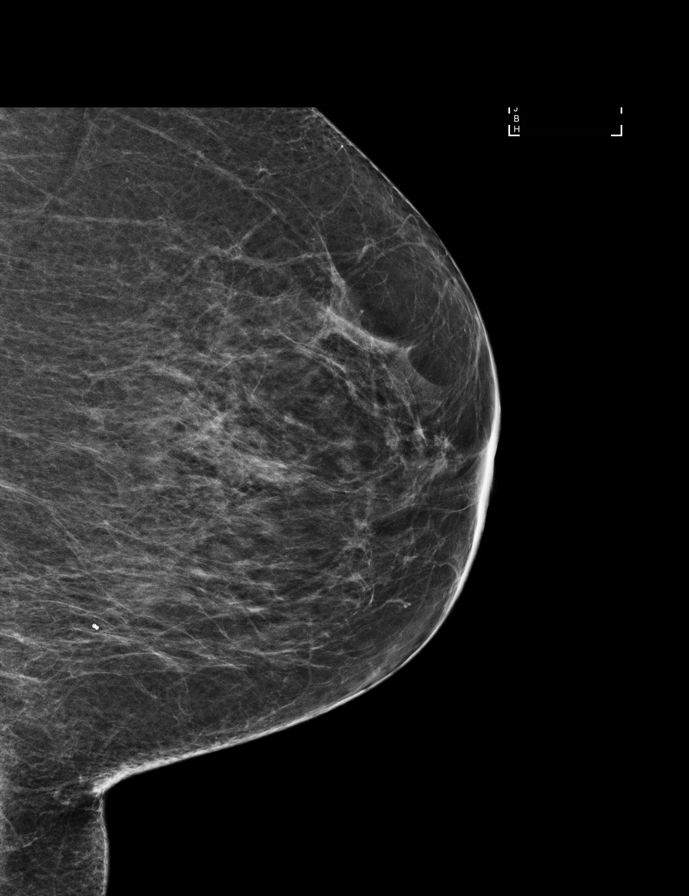

[R MLO synth-2D (1 of 2)]
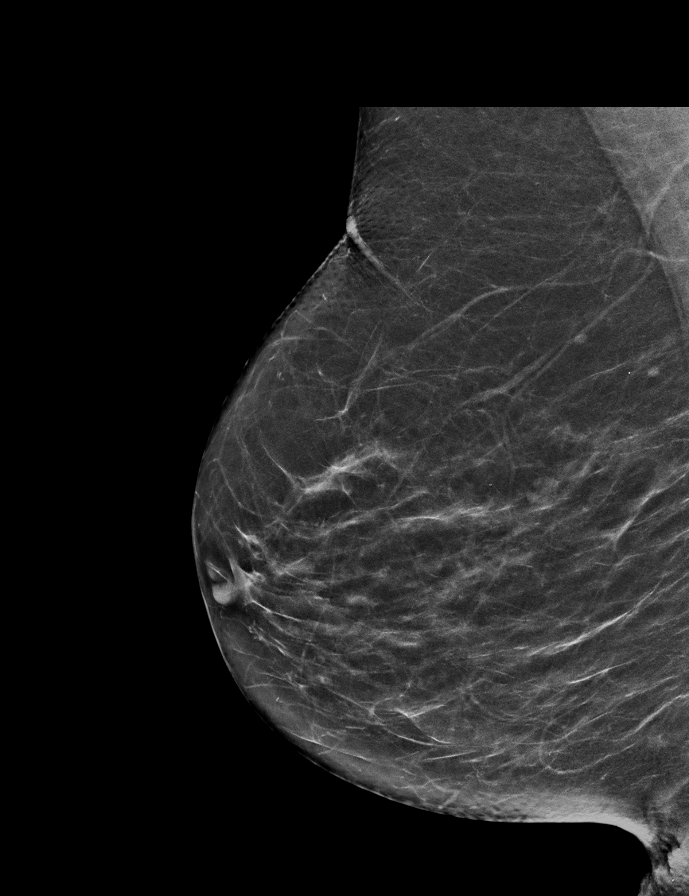

[R CC synth-2D]
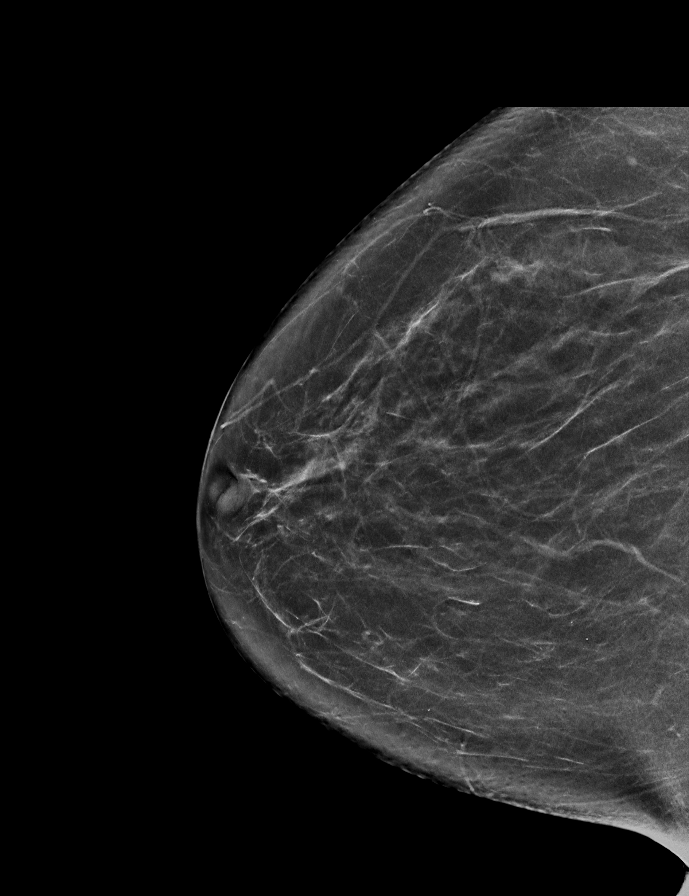

[R MLO synth-2D (2 of 2)]
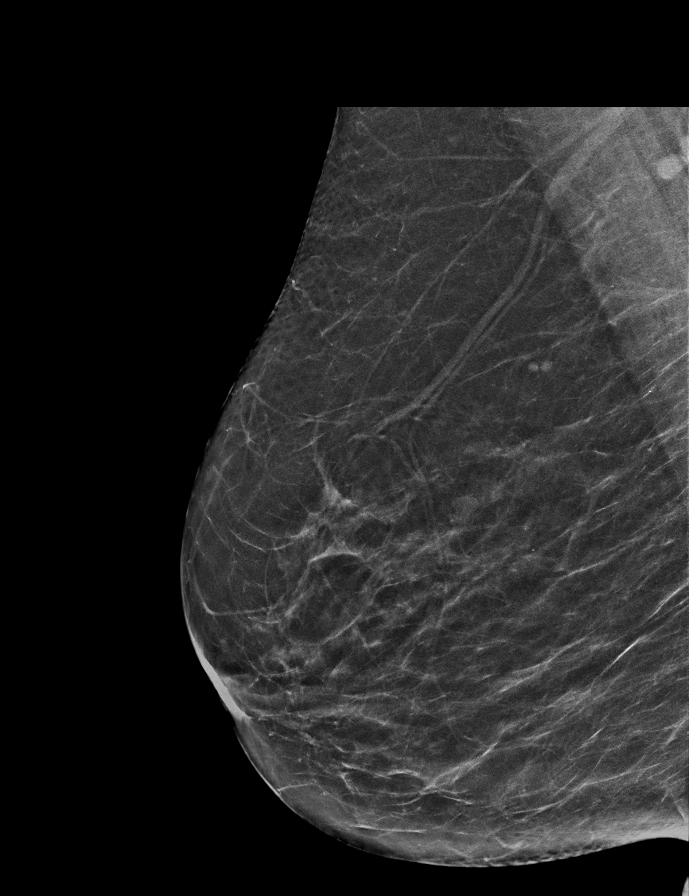

[R MLO (1 of 2)]
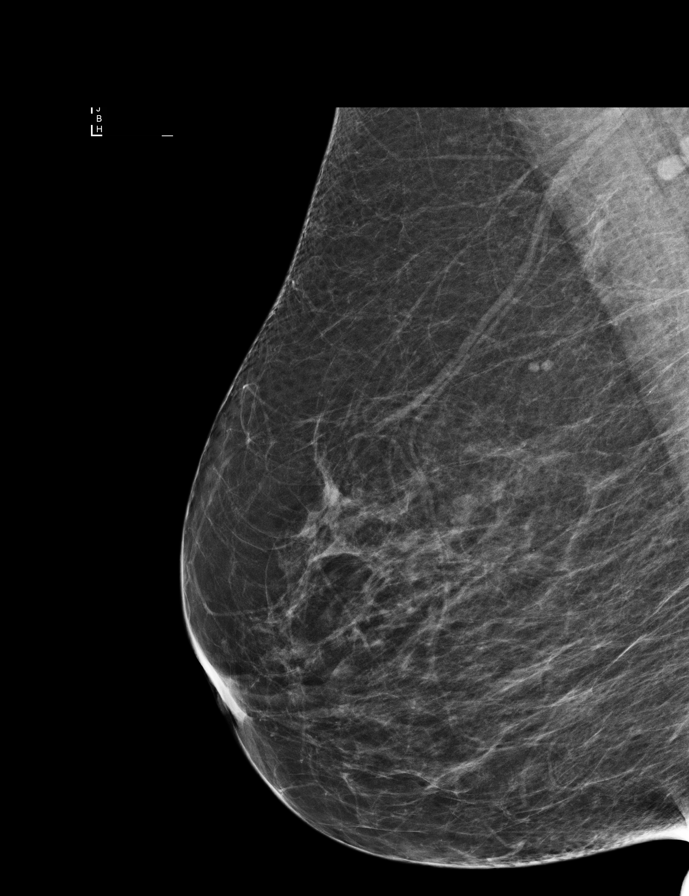

[R CC]
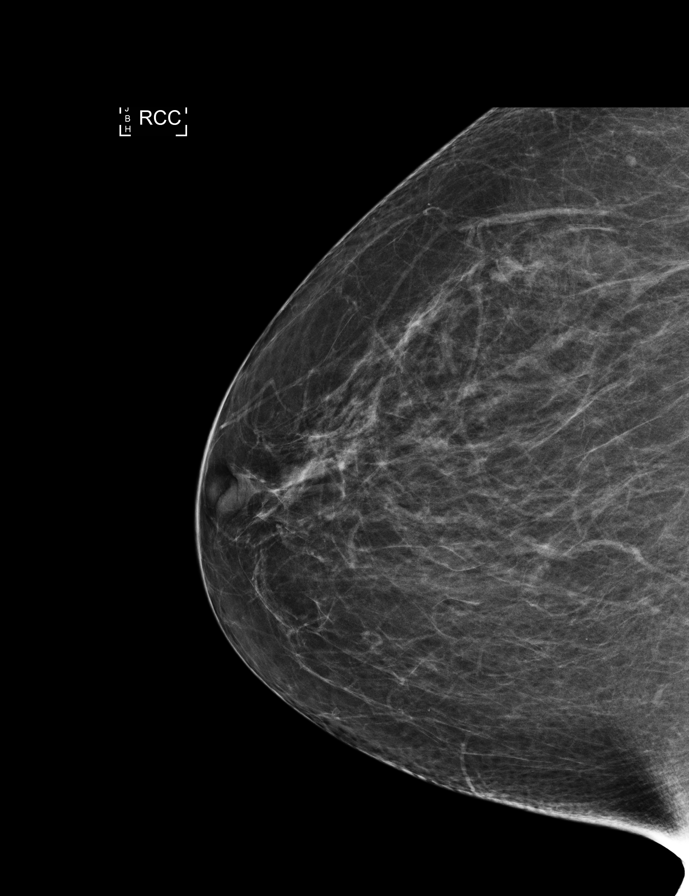

[R MLO (2 of 2)]
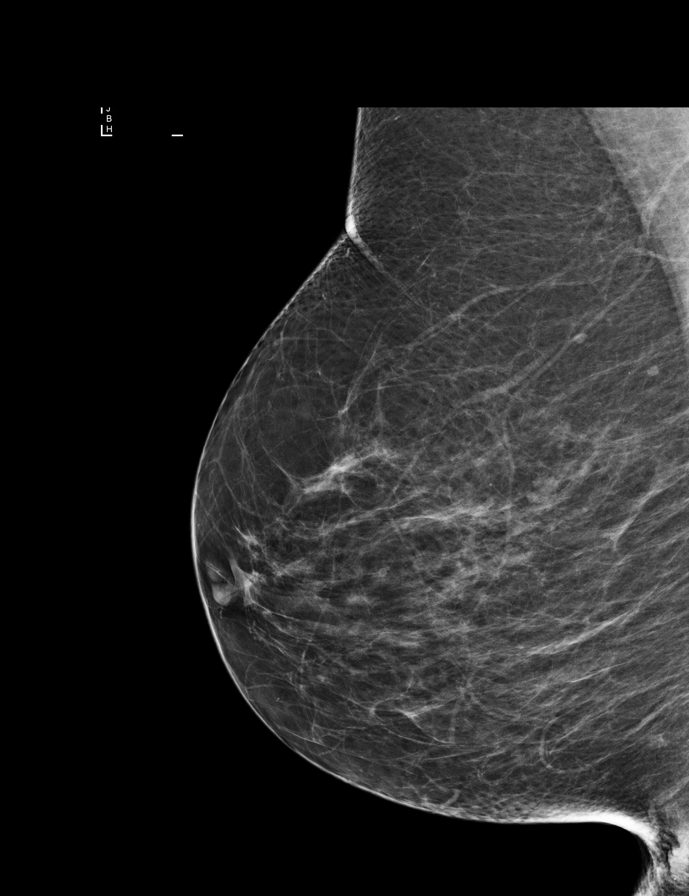

[L CC]
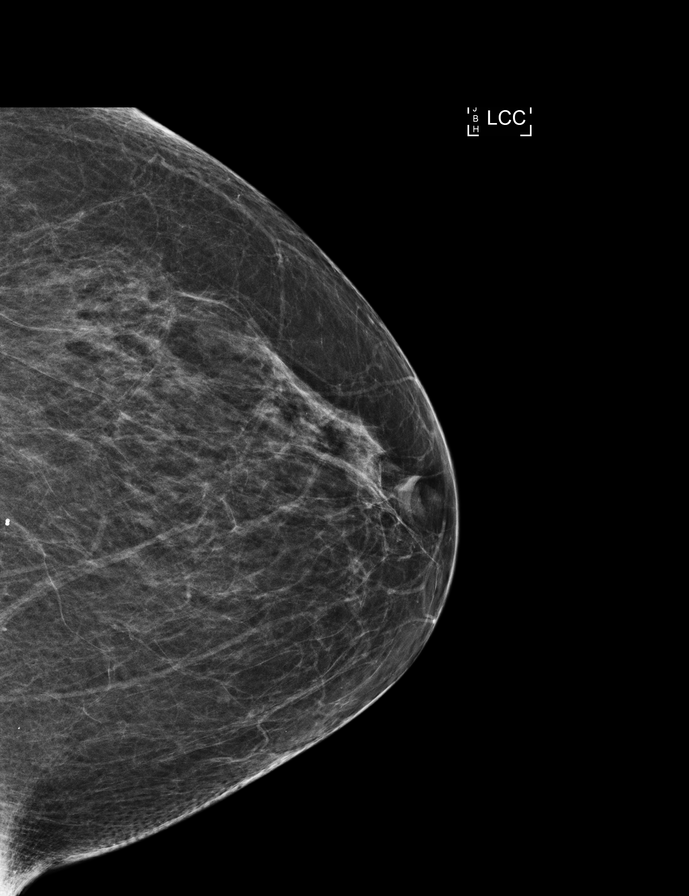

[L CC synth-2D]
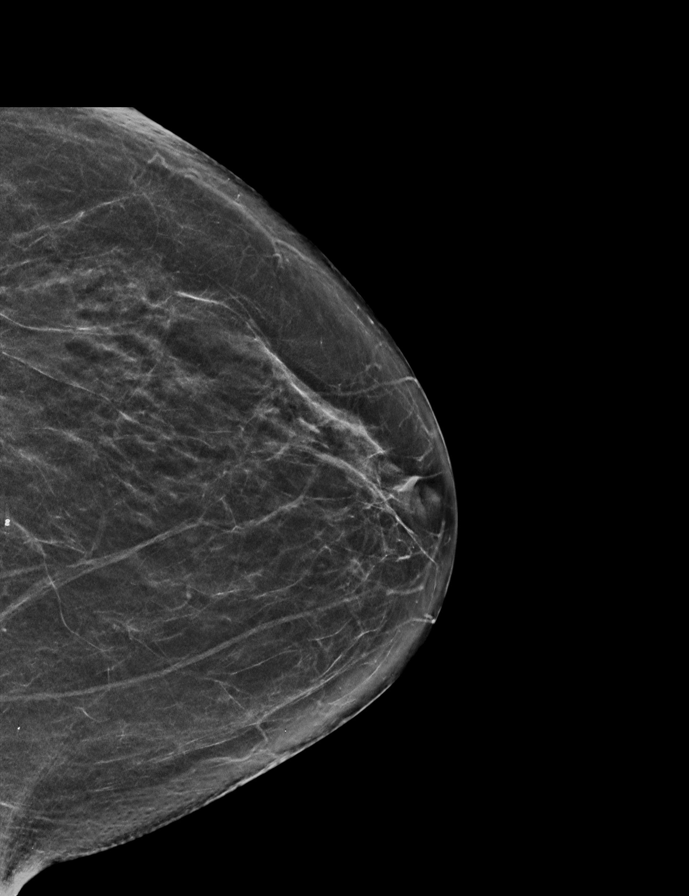

[9 of 36 positions shown; findings below may reference images not displayed]

ACR Breast Density Category c: The breast tissue is heterogeneously
dense, which may obscure small masses.
FINDINGS: There are no findings suspicious for malignancy. Images were
processed with CAD.
IMPRESSION: No mammographic evidence of malignancy. A result letter of this
screening mammogram will be mailed directly to the patient.

RECOMMENDATION:
Screening mammogram in one year. (Code:[id number])

BI-RADS CATEGORY  1: Negative.

## 2016-12-29 ENCOUNTER — Other Ambulatory Visit: Payer: Self-pay | Admitting: Internal Medicine

## 2017-01-03 NOTE — Telephone Encounter (Signed)
Completed 12/06/16 °

## 2017-01-10 NOTE — Telephone Encounter (Signed)
error 

## 2017-01-15 ENCOUNTER — Other Ambulatory Visit: Payer: Self-pay | Admitting: Internal Medicine

## 2017-01-15 NOTE — Telephone Encounter (Signed)
Please advise for refill, last OV was 10/19/16 ,acute with another provider, last with you was 09/07/16, last refill was 06/06/16, #[phone] with 5 refills.  Please advise, thanks

## 2017-01-15 NOTE — Telephone Encounter (Signed)
rx printed, signed and faxed

## 2017-01-29 ENCOUNTER — Other Ambulatory Visit: Payer: Self-pay | Admitting: Internal Medicine

## 2017-02-22 ENCOUNTER — Other Ambulatory Visit: Payer: Self-pay | Admitting: Internal Medicine

## 2017-03-21 ENCOUNTER — Encounter: Payer: Self-pay | Admitting: Internal Medicine

## 2017-03-21 ENCOUNTER — Ambulatory Visit: Payer: Medicare Other | Admitting: Internal Medicine

## 2017-03-21 VITALS — BP 104/66 | HR 64 | Temp 97.9°F | Resp 14 | Ht 62.5 in | Wt 183.8 lb

## 2017-03-21 DIAGNOSIS — Z8673 Personal history of transient ischemic attack (TIA), and cerebral infarction without residual deficits: Secondary | ICD-10-CM | POA: Insufficient documentation

## 2017-03-21 DIAGNOSIS — G894 Chronic pain syndrome: Secondary | ICD-10-CM | POA: Diagnosis not present

## 2017-03-21 DIAGNOSIS — G459 Transient cerebral ischemic attack, unspecified: Secondary | ICD-10-CM | POA: Diagnosis not present

## 2017-03-21 DIAGNOSIS — M2550 Pain in unspecified joint: Secondary | ICD-10-CM | POA: Diagnosis not present

## 2017-03-21 DIAGNOSIS — Z79899 Other long term (current) drug therapy: Secondary | ICD-10-CM | POA: Diagnosis not present

## 2017-03-21 LAB — SEDIMENTATION RATE: Sed Rate: 12 mm/hr (ref 0–30)

## 2017-03-21 LAB — CK: Total CK: 62 U/L (ref 7–177)

## 2017-03-21 LAB — HEPATIC FUNCTION PANEL
ALBUMIN: 4.5 g/dL (ref 3.5–5.2)
ALK PHOS: 64 U/L (ref 39–117)
ALT: 15 U/L (ref 0–35)
AST: 19 U/L (ref 0–37)
Bilirubin, Direct: 0.1 mg/dL (ref 0.0–0.3)
Total Bilirubin: 0.5 mg/dL (ref 0.2–1.2)
Total Protein: 7.4 g/dL (ref 6.0–8.3)

## 2017-03-21 NOTE — Progress Notes (Signed)
Subjective:  Patient ID: Erin Aguilar, female    DOB: 22-Aug-1950  Age: 66 y.o. MRN: [id number]  CC: The primary encounter diagnosis was Arthralgia, unspecified joint. Diagnoses of Long-term use of high-risk medication, TIA (transient ischemic attack), and Chronic pain syndrome were also pertinent to this visit.  HPI DURU REIGER presents for ER  FOLLOW UP. Presented to [facility] on fNov 28 with expressive aphasia , exact time of onset unclear but slurred speech was noted in the  early morning.  She drove to the ER at [facility] and was treated as a CODE Stroke. Symptoms resolved while in ER   CT head no acute changes Carotid ultrasounds < 10% R ICA otherwise normal   ASA continued,  Prescribed plavix x 20 days and atorvastatin  And given a follow up appt with Dr Cynda Familia made (neurology)   Her symptoms have not recurred. She is tolerating new medications , but has chronic joint pain involving neck and shoulders,  Doesn;t think it is worse since starting the medication.    Outpatient Medications Prior to Visit  Medication Sig Dispense Refill  . aspirin 81 MG chewable tablet Chew 81 mg by mouth.    Marland Kitchen atorvastatin (LIPITOR) 40 MG tablet Take 80 mg by mouth.    . butalbital-acetaminophen-caffeine (FIORICET WITH CODEINE) 50-325-40-30 MG capsule TAKE 1 CAPSULE BY MOUTH EVERY 6 HOURS AS NEEDED 60 capsule 2  . calcium-vitamin D (OSCAL) 250-125 MG-UNIT per tablet Take 1 tablet by mouth daily.    . cetirizine (ZYRTEC) 10 MG tablet Take 10 mg by mouth daily.    . Cholecalciferol (VITAMIN D3) 2000 UNITS capsule Take 2,000 Units by mouth daily.     . clopidogrel (PLAVIX) 75 MG tablet Take 75 mg by mouth.    . cyanocobalamin (,VITAMIN B-12,) 1000 MCG/ML injection Inject 1 mL (1,000 mcg total) into the muscle once a week. X 4, THEN MONTHLY 10 mL 03  . DULoxetine (CYMBALTA) 20 MG capsule TAKE (1) CAPSULE BY MOUTH ONCE DAILY. 90 capsule 0  . escitalopram (LEXAPRO) 20 MG tablet TAKE 1 TABLET BY MOUTH ONCE  DAILY. 30 tablet 0  . esomeprazole (NEXIUM) 40 MG capsule TAKE 1 CAPSULE IN THE MORNING BEFORE BREAKFAST. 30 capsule 0  . FeFum-FePo-FA-B Cmp-C-Zn-Mn-Cu (TANDEM PLUS) 162-115.2-1 MG CAPS Take 1 capsule by mouth daily. 30 each 5  . fluticasone (FLONASE) 50 MCG/ACT nasal spray Place 2 sprays into both nostrils daily. 16 g 6  . Multiple Vitamin (MULTIVITAMIN) tablet Take 1 tablet by mouth daily.    . pramipexole (MIRAPEX) 0.25 MG tablet TAKE ONE TABLET BY MOUTH AT BEDTIME. 90 tablet 0  . promethazine (PHENERGAN) 25 MG tablet TAKE (1) TABLET BY MOUTH EVERY SIX HOURS AS NEEDED. 30 tablet 3  . Syringe/Needle, Disp, (SYRINGE 3CC/25GX1") 25G X 1" 3 ML MISC Use for b12 injections 50 each 0  . UNABLE TO FIND Hemp Cannabinoid extract 1/2 dropper sublingual once daily for arthritis pain.    Marland Kitchen alendronate (FOSAMAX) 70 MG tablet TAKE (1) ONCE A WEEK 30 MINUTES BEFORE BREAKFAST WITH FULL GLASS OF WATER. DO NOT LIE DOWN AFTER. (Patient not taking: Reported on 03/21/2017) 12 tablet 3  . traMADol (ULTRAM) 50 MG tablet Take 1 tablet (50 mg total) by mouth every 8 (eight) hours as needed. (Patient not taking: Reported on 03/21/2017) 30 tablet 0   Facility-Administered Medications Prior to Visit  Medication Dose Route Frequency Provider Last Rate Last Dose  . 0.9 %  sodium chloride infusion  500 mL Intravenous Continuous Sherrilyn Ristanis, Henry L III, MD        Review of Systems;  Patient denies headache, fevers, malaise, unintentional weight loss, skin rash, eye pain, sinus congestion and sinus pain, sore throat, dysphagia,  hemoptysis , cough, dyspnea, wheezing, chest pain, palpitations, orthopnea, edema, abdominal pain, nausea, melena, diarrhea, constipation, flank pain, dysuria, hematuria, urinary  Frequency, nocturia, numbness, tingling, seizures,  Focal weakness, Loss of consciousness,  Tremor, insomnia, depression, anxiety, and suicidal ideation.      Objective:  BP 104/66 (BP Location: Left Arm, Patient Position:  Sitting, Cuff Size: Large)   Pulse 64   Temp 97.9 F (36.6 C) (Oral)   Resp 14   Ht 5' 2.5" (1.588 m)   Wt 183 lb 12.8 oz (83.4 kg)   SpO2 98%   BMI 33.08 kg/m   BP Readings from Last 3 Encounters:  03/21/17 104/66  12/06/16 112/70  10/19/16 118/76    Wt Readings from Last 3 Encounters:  03/21/17 183 lb 12.8 oz (83.4 kg)  12/06/16 183 lb 1.9 oz (83.1 kg)  10/19/16 184 lb 4 oz (83.6 kg)    General appearance: alert, cooperative and appears stated age Ears: normal TM's and external ear canals both ears Throat: lips, mucosa, and tongue normal; teeth and gums normal Neck: no adenopathy, no carotid bruit, supple, symmetrical, trachea midline and thyroid not enlarged, symmetric, no tenderness/mass/nodules Back: symmetric, no curvature. ROM normal. No CVA tenderness. Lungs: clear to auscultation bilaterally Heart: regular rate and rhythm, S1, S2 normal, no murmur, click, rub or gallop Abdomen: soft, non-tender; bowel sounds normal; no masses,  no organomegaly Pulses: 2+ and symmetric Skin: Skin color, texture, turgor normal. No rashes or lesions Lymph nodes: Cervical, supraclavicular, and axillary nodes normal. Neuro:  awake and interactive with normal mood and affect. Higher cortical functions are normal. Speech is clear without word-finding difficulty or dysarthria. Extraocular movements are intact. Visual fields of both eyes are grossly intact. Sensation to light touch is grossly intact bilaterally of upper and lower extremities. Motor examination shows 4+/5 symmetric hand grip and upper extremity and 5/5 lower extremity strength. There is no pronation or drift. Gait is non-ataxic    Lab Results  Component Value Date   HGBA1C 5.7 12/14/2014   HGBA1C 5.8 07/23/2013    Lab Results  Component Value Date   CREATININE 0.65 09/06/2015   CREATININE 0.73 02/10/2015   CREATININE 0.75 07/27/2014    Lab Results  Component Value Date   WBC 4.6 09/07/2016   HGB 11.8 (L)  09/07/2016   HCT 35.2 (L) 09/07/2016   PLT 233.0 09/07/2016   GLUCOSE 94 09/06/2015   CHOL 167 09/07/2016   TRIG 130.0 09/07/2016   HDL 61.30 09/07/2016   LDLDIRECT 106.0 06/10/2012   LDLCALC 80 09/07/2016   ALT 15 03/21/2017   AST 19 03/21/2017   NA 141 09/06/2015   K 4.2 09/06/2015   CL 108 09/06/2015   CREATININE 0.65 09/06/2015   BUN 14 09/06/2015   CO2 27 09/06/2015   TSH 1.51 09/07/2016   HGBA1C 5.7 12/14/2014     Assessment & Plan:   Problem List Items Addressed This Visit    Chronic pain syndrome    Her pain is chronic, and has been managed with cymbalta 20 mg and lexapro 20 mg daily .  Lodine did provide relief ,but because of her gastric bypass surgery L/t use of NSAIDs is contraindicated.  She has used fioricet as needed ,.  CK and Inflammatory  markers were checked today and are  normal today .      TIA (transient ischemic attack)    Workup for CVD has thus far included a CT head and , carotid ultrasound. MRI/MRA has been ordered and [facility]  She is tolerating aspirin,  plavix and atorvastatin .  Liver enzymes are normal today . Lab Results  Component Value Date   ALT 15 03/21/2017   AST 19 03/21/2017   ALKPHOS 64 03/21/2017   BILITOT 0.5 03/21/2017         Relevant Medications   aspirin 81 MG chewable tablet   atorvastatin (LIPITOR) 40 MG tablet   Other Relevant Orders   MR MRA HEAD WO CONTRAST    Other Visit Diagnoses    Arthralgia, unspecified joint    -  Primary   Relevant Orders   CK (Creatine Kinase) (Completed)   Sedimentation rate (Completed)   Long-term use of high-risk medication       Relevant Orders   Hepatic function panel (Completed)     A total of 40 minutes was spent with patient more than half of which was spent in counseling patient on the above mentioned issues , reviewing and explaining recent labs and imaging studies done, and coordination of care.  I have discontinued [name] A. Thebeau's  traMADol. I am also having her maintain her multivitamin, cetirizine, calcium-vitamin D, Vitamin D3, promethazine, UNABLE TO FIND, fluticasone, alendronate, TANDEM PLUS, cyanocobalamin, SYRINGE 3CC/25GX1", DULoxetine, pramipexole, butalbital-acetaminophen-caffeine, esomeprazole, escitalopram, aspirin, atorvastatin, and clopidogrel. We will continue to administer sodium chloride.  No orders of the defined types were placed in this encounter.   Medications Discontinued During This Encounter  Medication Reason  . traMADol (ULTRAM) 50 MG tablet Patient has not taken in last 30 days    Follow-up: No Follow-up on file.   Sherlene Shamseresa L Terryann Verbeek, MD

## 2017-03-21 NOTE — Assessment & Plan Note (Signed)
Workup for CVD has thus far included a CT head and , carotid ultrasound. MRI/MRA has been ordered and [facility]  She is tolerating aspirin,  plavix and atorvastatin .  Liver enzymes are normal today . Lab Results  Component Value Date   ALT 15 03/21/2017   AST 19 03/21/2017   ALKPHOS 64 03/21/2017   BILITOT 0.5 03/21/2017

## 2017-03-21 NOTE — Patient Instructions (Signed)
   I am checking your liver enzymes and some inflammatory makers today  If your ESR is elevated, we will work up your joint pain with additional labs and possible rheumatology evaluation  I have ordered and MRI/MRA of your brain to be done at [facility] before your neurology  evaluation with Dr Li  

## 2017-03-21 NOTE — Assessment & Plan Note (Addendum)
Her pain is chronic, and has been managed with cymbalta 20 mg and lexapro 20 mg daily .  Lodine did provide relief ,but because of her gastric bypass surgery L/t use of NSAIDs is contraindicated.  She has used fioricet as needed ,.  CK and Inflammatory markers were checked today and are  normal today .

## 2017-03-29 ENCOUNTER — Telehealth: Payer: Self-pay

## 2017-03-29 ENCOUNTER — Other Ambulatory Visit: Payer: Self-pay | Admitting: Internal Medicine

## 2017-03-29 NOTE — Telephone Encounter (Signed)
Tried calling "Erin Aguilar" back at number listed below. No one answered after being on hold for 10 minutes. [employer] does not require authorization for radiology images.

## 2017-03-29 NOTE — Telephone Encounter (Signed)
Copied from CRM [id number]. Topic: Referral - Request >> Mar 29, 2017  8:58 AM Trula SladeWalter, Linda F wrote: Reason for CRM: LuLu with [facility] [phone] needs authorization and effective dates for the patient's referral to have a MRI Head w/o and a MRA Head w/o.  They need this because the [facility] has [facility] [employer].

## 2017-04-09 ENCOUNTER — Ambulatory Visit: Payer: Medicare Other | Admitting: Internal Medicine

## 2017-04-19 ENCOUNTER — Ambulatory Visit: Payer: Medicare Other | Admitting: Internal Medicine

## 2017-04-19 ENCOUNTER — Other Ambulatory Visit: Payer: Self-pay | Admitting: Internal Medicine

## 2017-04-19 ENCOUNTER — Encounter: Payer: Self-pay | Admitting: Internal Medicine

## 2017-04-19 VITALS — BP 120/68 | HR 63 | Temp 98.1°F | Resp 15 | Ht 62.25 in | Wt 185.6 lb

## 2017-04-19 DIAGNOSIS — R110 Nausea: Secondary | ICD-10-CM | POA: Diagnosis not present

## 2017-04-19 DIAGNOSIS — M810 Age-related osteoporosis without current pathological fracture: Secondary | ICD-10-CM

## 2017-04-19 DIAGNOSIS — Z79899 Other long term (current) drug therapy: Secondary | ICD-10-CM | POA: Diagnosis not present

## 2017-04-19 DIAGNOSIS — E538 Deficiency of other specified B group vitamins: Secondary | ICD-10-CM | POA: Diagnosis not present

## 2017-04-19 DIAGNOSIS — F411 Generalized anxiety disorder: Secondary | ICD-10-CM

## 2017-04-19 DIAGNOSIS — Z8673 Personal history of transient ischemic attack (TIA), and cerebral infarction without residual deficits: Secondary | ICD-10-CM | POA: Diagnosis not present

## 2017-04-19 DIAGNOSIS — G459 Transient cerebral ischemic attack, unspecified: Secondary | ICD-10-CM | POA: Diagnosis not present

## 2017-04-19 DIAGNOSIS — E611 Iron deficiency: Secondary | ICD-10-CM

## 2017-04-19 LAB — COMPREHENSIVE METABOLIC PANEL
ALBUMIN: 4.1 g/dL (ref 3.5–5.2)
ALT: 18 U/L (ref 0–35)
AST: 20 U/L (ref 0–37)
Alkaline Phosphatase: 66 U/L (ref 39–117)
BUN: 15 mg/dL (ref 6–23)
CO2: 28 mEq/L (ref 19–32)
Calcium: 9.4 mg/dL (ref 8.4–10.5)
Chloride: 106 mEq/L (ref 96–112)
Creatinine, Ser: 0.79 mg/dL (ref 0.40–1.20)
GFR: 76.74 mL/min (ref >60.00)
Glucose, Bld: 103 mg/dL — ABNORMAL HIGH (ref 70–99)
Potassium: 4.7 mEq/L (ref 3.5–5.1)
SODIUM: 140 meq/L (ref 135–145)
Total Bilirubin: 0.6 mg/dL (ref 0.2–1.2)
Total Protein: 7 g/dL (ref 6.0–8.3)

## 2017-04-19 LAB — LIPID PANEL
Cholesterol: 112 mg/dL (ref 0–200)
HDL: 54.8 mg/dL (ref >39.00)
LDL Cholesterol: 31 mg/dL (ref 0–99)
NONHDL: 56.75
Total CHOL/HDL Ratio: 2
Triglycerides: 129 mg/dL (ref 0.0–149.0)
VLDL: 25.8 mg/dL (ref 0.0–40.0)

## 2017-04-19 LAB — VITAMIN B12

## 2017-04-19 MED ORDER — CYANOCOBALAMIN 1000 MCG/ML IJ SOLN: 1000.0000 ug | INTRAMUSCULAR | Status: DC

## 2017-04-19 MED ORDER — PROMETHAZINE HCL 25 MG PO TABS: ORAL_TABLET | ORAL | 3 refills | Status: DC

## 2017-04-19 MED ORDER — ZOSTER VAC RECOMB ADJUVANTED 50 MCG/0.5ML IM SUSR: 0.5000 mL | Freq: Once | INTRAMUSCULAR | 1 refills | Status: AC

## 2017-04-19 MED ORDER — ESOMEPRAZOLE MAGNESIUM 40 MG PO CPDR: DELAYED_RELEASE_CAPSULE | ORAL | 1 refills | Status: DC

## 2017-04-19 NOTE — Patient Instructions (Addendum)
Try the " Headspace"  Application on I phone  For the meditation techniques to help you sleep   Melatonin up to 5 mg  May also help  Treat your insomnia   I have refilled your phenergan    We will repeat your DEXA scan next year

## 2017-04-19 NOTE — Progress Notes (Signed)
Subjective:  Patient ID: Erin Aguilar, female    DOB: Feb 09, 1951  Age: 67 y.o. MRN: [id number]  CC: The primary encounter diagnosis was Nausea. Diagnoses of B12 deficiency, Iron deficiency, History of TIA (transient ischemic attack), Long-term use of high-risk medication, TIA (transient ischemic attack), Anxiety state, and Osteoporosis without current pathological fracture, unspecified osteoporosis type were also pertinent to this visit.  HPI Erin Aguilar presents for follow up on recent admission for TIA.  She has not had any recurrent  episodes of aphasia, or slurred speech. She has not been scheduled yet for her MRI of brain yet, which was ordered on Dec 5.  She had a Stressful christmas due to declining health of siblings and of her ex husband    Has neurology appointment Jan 31st .  Stopped the plavix after 20 days and is still taking the statin .  Feeling better since stopping plavix,  Still has days of aching.    Nausea:  Intermittent,  Since her bariatric surgery.  Not related by overating.  Uses phenergan prn for nausea AND abdominal distension du eto  dumping syndrome   Not daily or even weekly,  very unpredictable but aggravated by starchy food (pastries)    Needs Dexa  In 2019  Lab Results  Component Value Date   HGBA1C 5.7 12/14/2014      Outpatient Medications Prior to Visit  Medication Sig Dispense Refill  . butalbital-acetaminophen-caffeine (FIORICET WITH CODEINE) 50-325-40-30 MG capsule TAKE 1 CAPSULE BY MOUTH EVERY 6 HOURS AS NEEDED 60 capsule 2  . calcium-vitamin D (OSCAL) 250-125 MG-UNIT per tablet Take 1 tablet by mouth daily.    . cetirizine (ZYRTEC) 10 MG tablet Take 10 mg by mouth daily.    . Cholecalciferol (VITAMIN D3) 2000 UNITS capsule Take 2,000 Units by mouth daily.     . DULoxetine (CYMBALTA) 20 MG capsule TAKE (1) CAPSULE BY MOUTH ONCE DAILY. 90 capsule 0  . escitalopram (LEXAPRO) 20 MG tablet TAKE 1 TABLET BY MOUTH ONCE DAILY. 30 tablet 0  .  fluticasone (FLONASE) 50 MCG/ACT nasal spray Place 2 sprays into both nostrils daily. 16 g 6  . Multiple Vitamin (MULTIVITAMIN) tablet Take 1 tablet by mouth daily.    . pramipexole (MIRAPEX) 0.25 MG tablet TAKE ONE TABLET BY MOUTH AT BEDTIME. 90 tablet 0  . Syringe/Needle, Disp, (SYRINGE 3CC/25GX1") 25G X 1" 3 ML MISC Use for b12 injections 50 each 0  . UNABLE TO FIND Hemp Cannabinoid extract 1/2 dropper sublingual once daily for arthritis pain.    . cyanocobalamin (,VITAMIN B-12,) 1000 MCG/ML injection Inject 1 mL (1,000 mcg total) into the muscle once a week. X 4, THEN MONTHLY 10 mL 03  . esomeprazole (NEXIUM) 40 MG capsule TAKE 1 CAPSULE IN THE MORNING BEFORE BREAKFAST. 30 capsule 0  . promethazine (PHENERGAN) 25 MG tablet TAKE (1) TABLET BY MOUTH EVERY SIX HOURS AS NEEDED. 30 tablet 3  . atorvastatin (LIPITOR) 40 MG tablet Take 80 mg by mouth.    Marland Kitchen alendronate (FOSAMAX) 70 MG tablet TAKE (1) ONCE A WEEK 30 MINUTES BEFORE BREAKFAST WITH FULL GLASS OF WATER. DO NOT LIE DOWN AFTER. (Patient not taking: Reported on 03/21/2017) 12 tablet 3  . FeFum-FePo-FA-B Cmp-C-Zn-Mn-Cu (TANDEM PLUS) 162-115.2-1 MG CAPS Take 1 capsule by mouth daily. (Patient not taking: Reported on 04/19/2017) 30 each 5   Facility-Administered Medications Prior to Visit  Medication Dose Route Frequency Provider Last Rate Last Dose  . 0.9 %  sodium chloride infusion  500 mL Intravenous Continuous Sherrilyn Rist, MD        Review of Systems;  Patient denies headache, fevers, malaise, unintentional weight loss, skin rash, eye pain, sinus congestion and sinus pain, sore throat, dysphagia,  hemoptysis , cough, dyspnea, wheezing, chest pain, palpitations, orthopnea, edema, abdominal pain, nausea, melena, diarrhea, constipation, flank pain, dysuria, hematuria, urinary  Frequency, nocturia, numbness, tingling, seizures,  Focal weakness, Loss of consciousness,  Tremor, insomnia, depression, anxiety, and suicidal ideation.       Objective:  BP 120/68 (BP Location: Left Arm, Patient Position: Sitting, Cuff Size: Large)   Pulse 63   Temp 98.1 F (36.7 C) (Oral)   Resp 15   Ht 5' 2.25" (1.581 m)   Wt 185 lb 9.6 oz (84.2 kg)   SpO2 98%   BMI 33.67 kg/m   BP Readings from Last 3 Encounters:  04/19/17 120/68  03/21/17 104/66  12/06/16 112/70    Wt Readings from Last 3 Encounters:  04/19/17 185 lb 9.6 oz (84.2 kg)  03/21/17 183 lb 12.8 oz (83.4 kg)  12/06/16 183 lb 1.9 oz (83.1 kg)    General appearance: alert, cooperative and appears stated age Ears: normal TM's and external ear canals both ears Throat: lips, mucosa, and tongue normal; teeth and gums normal Neck: no adenopathy, no carotid bruit, supple, symmetrical, trachea midline and thyroid not enlarged, symmetric, no tenderness/mass/nodules Back: symmetric, no curvature. ROM normal. No CVA tenderness. Lungs: clear to auscultation bilaterally Heart: regular rate and rhythm, S1, S2 normal, no murmur, click, rub or gallop Abdomen: soft, non-tender; bowel sounds normal; no masses,  no organomegaly Pulses: 2+ and symmetric Skin: Skin color, texture, turgor normal. No rashes or lesions Lymph nodes: Cervical, supraclavicular, and axillary nodes normal.  Lab Results  Component Value Date   HGBA1C 5.7 12/14/2014   HGBA1C 5.8 07/23/2013    Lab Results  Component Value Date   CREATININE 0.79 04/19/2017   CREATININE 0.65 09/06/2015   CREATININE 0.73 02/10/2015    Lab Results  Component Value Date   WBC 4.6 09/07/2016   HGB 11.8 (L) 09/07/2016   HCT 35.2 (L) 09/07/2016   PLT 233.0 09/07/2016   GLUCOSE 103 (H) 04/19/2017   CHOL 112 04/19/2017   TRIG 129.0 04/19/2017   HDL 54.80 04/19/2017   LDLDIRECT 106.0 06/10/2012   LDLCALC 31 04/19/2017   ALT 18 04/19/2017   AST 20 04/19/2017   NA 140 04/19/2017   K 4.7 04/19/2017   CL 106 04/19/2017   CREATININE 0.79 04/19/2017   BUN 15 04/19/2017   CO2 28 04/19/2017   TSH 1.51 09/07/2016    HGBA1C 5.7 12/14/2014    Mm Digital Screening Bilateral  Result Date: 11/06/2016 CLINICAL DATA:  Screening. EXAM: DIGITAL SCREENING BILATERAL MAMMOGRAM WITH CAD COMPARISON:  Previous exam(s). ACR Breast Density Category b: There are scattered areas of fibroglandular density. FINDINGS: There are no findings suspicious for malignancy. Images were processed with CAD. IMPRESSION: No mammographic evidence of malignancy. A result letter of this screening mammogram will be mailed directly to the patient. RECOMMENDATION: Screening mammogram in one year. (Code:SM-B-01Y) BI-RADS CATEGORY  1: Negative. Electronically Signed   By: Harmon Pier M.D.   On: 11/06/2016 13:31    Assessment & Plan:   Problem List Items Addressed This Visit    B12 deficiency   Relevant Orders   B12 (Completed)   Anxiety state    Long discussion about source of anxiety,  Appears to be secondary to persistent concern  about her husband's perceived decline in health and the added stress of care of his sisters .   Advised to avoid use of benzodiazepines continue lexapro 20 mg daily , and use nonpharmacologic methods to manage insomnia.        Osteoporosis    She has had a progressive loss of bone density with T scores now  -2.9  In the spine by 2016 DEXA . Continue   Calcium and Vitamin D supplements and alendronate. Repeat DEXA in in 2109.      TIA (transient ischemic attack)    Workup for CVD has thus far included a CT head and , carotid ultrasound. MRI/MRA has been ordered and [facility]  She hs conpleted plavix x 20 days and is tolerating aspirin and atorvastatin .  Liver enzymes are normal today . Lab Results  Component Value Date   ALT 18 04/19/2017   AST 20 04/19/2017   ALKPHOS 66 04/19/2017   BILITOT 0.6 04/19/2017   Lab Results  Component Value Date   ALT 18 04/19/2017   AST 20 04/19/2017   ALKPHOS 66 04/19/2017   BILITOT 0.6 04/19/2017          Other Visit Diagnoses     Nausea    -  Primary   Relevant Medications   promethazine (PHENERGAN) 25 MG tablet   Iron deficiency       Relevant Orders   Iron, TIBC and Ferritin Panel (Completed)   History of TIA (transient ischemic attack)       Relevant Orders   Lipid panel (Completed)   Long-term use of high-risk medication       Relevant Orders   Comprehensive metabolic panel (Completed)      I have discontinued [name] A. Plack's alendronate, TANDEM PLUS, and esomeprazole. I have also changed her cyanocobalamin. Additionally, I am having her start on Zoster Vaccine Adjuvanted. Lastly, I am having her maintain her multivitamin, cetirizine, calcium-vitamin D, Vitamin D3, UNABLE TO FIND, fluticasone, SYRINGE 3CC/25GX1", butalbital-acetaminophen-caffeine, atorvastatin, pramipexole, escitalopram, DULoxetine, and promethazine. We will continue to administer sodium chloride.  Meds ordered this encounter  Medications  . promethazine (PHENERGAN) 25 MG tablet    Sig: TAKE (1) TABLET BY MOUTH EVERY SIX HOURS AS NEEDED.    Dispense:  30 tablet    Refill:  3  . cyanocobalamin (,VITAMIN B-12,) 1000 MCG/ML injection    Sig: Inject 1 mL (1,000 mcg total) into the muscle every 30 (thirty) days. X 4, THEN MONTHLY    Dispense:  10 mL    Refill:  03  . DISCONTD: esomeprazole (NEXIUM) 40 MG capsule    Sig: TAKE 1 CAPSULE IN THE MORNING BEFORE BREAKFAST.    Dispense:  90 capsule    Refill:  1  . Zoster Vaccine Adjuvanted [facility](SHINGRIX) injection    Sig: Inject 0.5 mLs into the muscle once for 1 dose.    Dispense:  1 each    Refill:  1   A total of 25 minutes of face to face time was spent with patient more than half of which was spent in counselling about the above mentioned conditions  and coordination of care  Medications Discontinued During This Encounter  Medication Reason  . alendronate (FOSAMAX) 70 MG tablet Patient has not taken in last 30 days  . FeFum-FePo-FA-B Cmp-C-Zn-Mn-Cu (TANDEM PLUS) 162-115.2-1 MG CAPS  Patient has not taken in last 30 days  . promethazine (PHENERGAN) 25 MG tablet Reorder  .  cyanocobalamin (,VITAMIN B-12,) 1000 MCG/ML injection   . esomeprazole (NEXIUM) 40 MG capsule Reorder    Follow-up: No Follow-up on file.   Sherlene Shams, MD

## 2017-04-20 LAB — IRON,TIBC AND FERRITIN PANEL
%SAT: 42 % (calc) (ref 11–50)
Ferritin: 14 ng/mL — ABNORMAL LOW (ref 20–288)
IRON: 132 ug/dL (ref 45–160)
TIBC: 314 mcg/dL (calc) (ref 250–450)

## 2017-04-21 NOTE — Assessment & Plan Note (Signed)
She has had a progressive loss of bone density with T scores now  -2.9  In the spine by 2016 DEXA . Continue   Calcium and Vitamin D supplements and alendronate. Repeat DEXA in in 2109.

## 2017-04-21 NOTE — Assessment & Plan Note (Addendum)
Workup for CVD has thus far included a CT head and , carotid ultrasound. MRI/MRA has been ordered and [facility]  She hs conpleted plavix x 20 days and is tolerating aspirin and atorvastatin .  Liver enzymes are normal today . Lab Results  Component Value Date   ALT 18 04/19/2017   AST 20 04/19/2017   ALKPHOS 66 04/19/2017   BILITOT 0.6 04/19/2017   Lab Results  Component Value Date   ALT 18 04/19/2017   AST 20 04/19/2017   ALKPHOS 66 04/19/2017   BILITOT 0.6 04/19/2017

## 2017-04-21 NOTE — Assessment & Plan Note (Signed)
Long discussion about source of anxiety,  Appears to be secondary to persistent concern  about her husband's perceived decline in health and the added stress of care of his sisters .   Advised to avoid use of benzodiazepines continue lexapro 20 mg daily , and use nonpharmacologic methods to manage insomnia.

## 2017-04-23 ENCOUNTER — Encounter: Payer: Self-pay | Admitting: *Deleted

## 2017-05-02 ENCOUNTER — Telehealth: Payer: Self-pay | Admitting: Internal Medicine

## 2017-05-02 ENCOUNTER — Other Ambulatory Visit: Payer: Self-pay | Admitting: Internal Medicine

## 2017-05-02 DIAGNOSIS — G459 Transient cerebral ischemic attack, unspecified: Secondary | ICD-10-CM

## 2017-05-02 NOTE — Telephone Encounter (Signed)
Her MRI/MRA showed no acute changes or aneurysms or blockages.  Evidence of an old stroke and chronic small vessel changes were noted. [facility] so her neurologist will be able to see the images

## 2017-05-03 NOTE — Telephone Encounter (Signed)
LMTCB. PEC may speak with pt.  

## 2017-05-04 NOTE — Telephone Encounter (Signed)
Patient called in for MRI results, results given per note Dr. Darrick Huntsmanullo 05/02/17, patient verbalized understanding, unable to chart in result notes due to result note not [facility].

## 2017-05-22 ENCOUNTER — Other Ambulatory Visit: Payer: Self-pay | Admitting: Internal Medicine

## 2017-06-12 ENCOUNTER — Other Ambulatory Visit: Payer: Self-pay | Admitting: Internal Medicine

## 2017-06-13 NOTE — Telephone Encounter (Signed)
I ok'd refill for pramipexole (mirapex) #90 with no refills.  Regarding the fioricet, need to clarify how she is taking the medication.  She was given #60 with two refills in 01/2018.   Not sure that she should be out of the medication.

## 2017-06-13 NOTE — Telephone Encounter (Signed)
Fiorcet     Refilled: 01/15/2017  Pramipexole     Refilled: 03/29/2017  Last OV: 04/19/2017 Next OV: 09/12/2017

## 2017-06-20 ENCOUNTER — Other Ambulatory Visit: Payer: Self-pay | Admitting: Internal Medicine

## 2017-06-20 MED ORDER — BUTALBITAL-APAP-CAFF-COD 50-325-40-30 MG PO CAPS: ORAL_CAPSULE | ORAL | 2 refills | Status: DC

## 2017-06-20 NOTE — Telephone Encounter (Signed)
Copied from CRM [id number]. Topic: Quick Communication - Rx Refill/Question >> Jun 20, 2017 11:53 AM Lelon FrohlichGolden, Tashia, RMA wrote: Medication: butalbital 50-325 mg   Has the patient contacted their pharmacy? yes   (Agent: If no, request that the patient contact the pharmacy for the refill.)   Preferred Pharmacy (with phone number or street name): [facility] in [place]   Agent: Please be advised that RX refills may take up to 3 business days. We ask that you follow-up with your pharmacy.

## 2017-06-20 NOTE — Telephone Encounter (Signed)
Refilled: 01/15/2017 Last OV: 04/19/2017 Next OV: 09/12/2017

## 2017-06-21 NOTE — Telephone Encounter (Signed)
Printed, signed and faxed.  

## 2017-07-17 ENCOUNTER — Other Ambulatory Visit: Payer: Self-pay

## 2017-07-17 ENCOUNTER — Ambulatory Visit: Payer: Medicare Other | Admitting: Family Medicine

## 2017-07-17 ENCOUNTER — Encounter: Payer: Self-pay | Admitting: Family Medicine

## 2017-07-17 DIAGNOSIS — K5792 Diverticulitis of intestine, part unspecified, without perforation or abscess without bleeding: Secondary | ICD-10-CM

## 2017-07-17 MED ORDER — AMOXICILLIN-POT CLAVULANATE 875-125 MG PO TABS: 1.0000 | ORAL_TABLET | Freq: Two times a day (BID) | ORAL | 0 refills | Status: DC

## 2017-07-17 NOTE — Patient Instructions (Addendum)
Push fluids.  Complete 10 day of Augmentin. Can start probiotic while on antibiotics.  Call if not improving as expected in next 24-48 hours or fever on antibiotics or not able to keep down meds..  Close follow up with PCP in 2-3 days.

## 2017-07-17 NOTE — Assessment & Plan Note (Addendum)
Very typical of her past diverticulitis.. No clear need for imaging. No red flags for perforation.Given allergy to cipro.. Treat with Augmentin.  Close follow up with PCP and return precautions given.

## 2017-07-17 NOTE — Progress Notes (Signed)
Subjective:    Patient ID: Erin Aguilar, female    DOB: 1950/09/27, 67 y.o.   MRN: [id number]  HPI    67 year old pt of Dr. Darrick Huntsmanullo presents with new onset abdominal pain in last 5 days.  Pain is in LLQ, gradually worsening.  This AM pain in LLQ with BM.Marland Kitchen. Severe pain.  No blood in stool. No new constipation or diarrhea.. She tried stool softners just in case it was constipation.  Small amount of pain going over bumps in car and walking.  No fever.  She has had diverticulitis in past and this feel like it. She uses codiene for headache and she tried this for her abd pain.  Last colonoscopy 2017 Dr. Myrtie Neitheranis:  Multiple tic in left and right colon.  Hx of bariatric surgery, intermittent nausea following.. On phenergan for dumping syndrome. Blood pressure 108/70, pulse 73, temperature 98.4 F (36.9 C), temperature source Oral, height 5' 2.25" (1.581 m), weight 187 lb 8 oz (85 kg).  Review of Systems  Constitutional: Negative for fatigue and fever.  HENT: Negative for congestion.   Eyes: Negative for pain.  Respiratory: Negative for cough and shortness of breath.   Cardiovascular: Negative for chest pain, palpitations and leg swelling.  Gastrointestinal: Positive for abdominal distention and abdominal pain. Negative for blood in stool, constipation, diarrhea and nausea.  Genitourinary: Negative for dysuria and vaginal bleeding.  Musculoskeletal: Negative for back pain.  Neurological: Negative for syncope, light-headedness and headaches.  Psychiatric/Behavioral: Negative for dysphoric mood.       Objective:   Physical Exam  Constitutional: Vital signs are normal. She appears well-developed and well-nourished. She is cooperative.  Non-toxic appearance. She does not appear ill. No distress.  HENT:  Head: Normocephalic.  Right Ear: Hearing, tympanic membrane, external ear and ear canal normal. Tympanic membrane is not erythematous, not retracted and not bulging.  Left Ear: Hearing,  tympanic membrane, external ear and ear canal normal. Tympanic membrane is not erythematous, not retracted and not bulging.  Nose: No mucosal edema or rhinorrhea. Right sinus exhibits no maxillary sinus tenderness and no frontal sinus tenderness. Left sinus exhibits no maxillary sinus tenderness and no frontal sinus tenderness.  Mouth/Throat: Uvula is midline, oropharynx is clear and moist and mucous membranes are normal.  Eyes: Pupils are equal, round, and reactive to light. Conjunctivae, EOM and lids are normal. Lids are everted and swept, no foreign bodies found.  Neck: Trachea normal and normal range of motion. Neck supple. Carotid bruit is not present. No thyroid mass and no thyromegaly present.  Cardiovascular: Normal rate, regular rhythm, S1 normal, S2 normal, normal heart sounds, intact distal pulses and normal pulses. Exam reveals no gallop and no friction rub.  No murmur heard. Pulmonary/Chest: Effort normal and breath sounds normal. No tachypnea. No respiratory distress. She has no decreased breath sounds. She has no wheezes. She has no rhonchi. She has no rales.  Abdominal: Soft. Normal appearance and bowel sounds are normal. There is no hepatosplenomegaly. There is tenderness in the left lower quadrant. There is rebound. There is no rigidity, no guarding, no CVA tenderness, no tenderness at McBurney's point and negative Murphy's sign.  Neurological: She is alert.  Skin: Skin is warm, dry and intact. No rash noted.  Psychiatric: Her speech is normal and behavior is normal. Judgment and thought content normal. Her mood appears not anxious. Cognition and memory are normal. She does not exhibit a depressed mood.  Assessment & Plan:

## 2017-07-20 ENCOUNTER — Ambulatory Visit: Payer: Medicare Other | Admitting: Internal Medicine

## 2017-07-20 MED ORDER — TRAMADOL HCL 50 MG PO TABS
50.00 mg | ORAL_TABLET | ORAL | Status: DC
Start: ? — End: 2017-07-20

## 2017-07-20 MED ORDER — PROMETHAZINE HCL 12.5 MG PO TABS
25.00 mg | ORAL_TABLET | ORAL | Status: DC
Start: ? — End: 2017-07-20

## 2017-07-20 MED ORDER — B COMPLEX-C PO TABS
1.00 | ORAL_TABLET | ORAL | Status: DC
Start: 2017-07-21 — End: 2017-07-20

## 2017-07-20 MED ORDER — DULOXETINE HCL 20 MG PO CPEP
20.00 mg | ORAL_CAPSULE | ORAL | Status: DC
Start: 2017-07-21 — End: 2017-07-20

## 2017-07-20 MED ORDER — CETIRIZINE HCL 10 MG PO TABS
10.00 mg | ORAL_TABLET | ORAL | Status: DC
Start: 2017-07-21 — End: 2017-07-20

## 2017-07-20 MED ORDER — ONDANSETRON 4 MG PO TBDP
4.00 mg | ORAL_TABLET | ORAL | Status: DC
Start: ? — End: 2017-07-20

## 2017-07-20 MED ORDER — GENERIC EXTERNAL MEDICATION
3.00 | Status: DC
Start: 2017-07-20 — End: 2017-07-20

## 2017-07-20 MED ORDER — POLYETHYLENE GLYCOL 3350 17 G PO PACK
17.00 | PACK | ORAL | Status: DC
Start: 2017-07-21 — End: 2017-07-20

## 2017-07-20 MED ORDER — ATORVASTATIN CALCIUM 80 MG PO TABS
80.00 mg | ORAL_TABLET | ORAL | Status: DC
Start: 2017-07-20 — End: 2017-07-20

## 2017-07-20 MED ORDER — BUTALBITAL-APAP-CAFFEINE 50-325-40 MG PO TABS
1.00 | ORAL_TABLET | ORAL | Status: DC
Start: ? — End: 2017-07-20

## 2017-07-20 MED ORDER — PRAMIPEXOLE DIHYDROCHLORIDE 0.25 MG PO TABS
0.25 mg | ORAL_TABLET | ORAL | Status: DC
Start: 2017-07-20 — End: 2017-07-20

## 2017-07-20 MED ORDER — SENNOSIDES 8.6 MG PO TABS
2.00 | ORAL_TABLET | ORAL | Status: DC
Start: ? — End: 2017-07-20

## 2017-07-20 MED ORDER — GENERIC EXTERNAL MEDICATION
Status: DC
Start: ? — End: 2017-07-20

## 2017-07-20 MED ORDER — ASPIRIN 81 MG PO CHEW
81.00 mg | CHEWABLE_TABLET | ORAL | Status: DC
Start: 2017-07-21 — End: 2017-07-20

## 2017-07-20 MED ORDER — CHOLECALCIFEROL 25 MCG (1000 UT) PO TABS
2000.00 | ORAL_TABLET | ORAL | Status: DC
Start: 2017-07-21 — End: 2017-07-20

## 2017-07-20 MED ORDER — ESCITALOPRAM OXALATE 10 MG PO TABS
10.00 mg | ORAL_TABLET | ORAL | Status: DC
Start: 2017-07-21 — End: 2017-07-20

## 2017-07-20 MED ORDER — DIPHENHYDRAMINE HCL 25 MG PO CAPS
25.00 mg | ORAL_CAPSULE | ORAL | Status: DC
Start: ? — End: 2017-07-20

## 2017-08-01 ENCOUNTER — Encounter: Payer: Self-pay | Admitting: Internal Medicine

## 2017-08-01 ENCOUNTER — Ambulatory Visit: Payer: Medicare Other | Admitting: Internal Medicine

## 2017-08-01 VITALS — BP 112/78 | HR 67 | Temp 97.8°F | Resp 15 | Ht 62.25 in | Wt 184.2 lb

## 2017-08-01 DIAGNOSIS — K5792 Diverticulitis of intestine, part unspecified, without perforation or abscess without bleeding: Secondary | ICD-10-CM

## 2017-08-01 DIAGNOSIS — Z8719 Personal history of other diseases of the digestive system: Secondary | ICD-10-CM | POA: Diagnosis not present

## 2017-08-01 DIAGNOSIS — Z09 Encounter for follow-up examination after completed treatment for conditions other than malignant neoplasm: Secondary | ICD-10-CM

## 2017-08-01 LAB — CBC WITH DIFFERENTIAL/PLATELET
BASOS ABS: 0.1 10*3/uL (ref 0.0–0.1)
Basophils Relative: 0.9 % (ref 0.0–3.0)
Eosinophils Absolute: 0.3 10*3/uL (ref 0.0–0.7)
Eosinophils Relative: 5 % (ref 0.0–5.0)
HCT: 38.1 % (ref 36.0–46.0)
Hemoglobin: 12.9 g/dL (ref 12.0–15.0)
Lymphocytes Relative: 36.6 % (ref 12.0–46.0)
Lymphs Abs: 2.4 10*3/uL (ref 0.7–4.0)
MCHC: 34 g/dL (ref 30.0–36.0)
MCV: 100.1 fl — AB (ref 78.0–100.0)
MONOS PCT: 8.3 % (ref 3.0–12.0)
Monocytes Absolute: 0.5 10*3/uL (ref 0.1–1.0)
Neutro Abs: 3.2 10*3/uL (ref 1.4–7.7)
Neutrophils Relative %: 49.2 % (ref 43.0–77.0)
PLATELETS: 270 10*3/uL (ref 150.0–400.0)
RBC: 3.81 Mil/uL — ABNORMAL LOW (ref 3.87–5.11)
RDW: 12.8 % (ref 11.5–15.5)
WBC: 6.5 10*3/uL (ref 4.0–10.5)

## 2017-08-01 LAB — COMPREHENSIVE METABOLIC PANEL
ALT: 29 U/L (ref 0–35)
AST: 26 U/L (ref 0–37)
Albumin: 4.2 g/dL (ref 3.5–5.2)
Alkaline Phosphatase: 78 U/L (ref 39–117)
BILIRUBIN TOTAL: 0.5 mg/dL (ref 0.2–1.2)
BUN: 21 mg/dL (ref 6–23)
CO2: 26 meq/L (ref 19–32)
Calcium: 9.8 mg/dL (ref 8.4–10.5)
Chloride: 104 mEq/L (ref 96–112)
Creatinine, Ser: 0.84 mg/dL (ref 0.40–1.20)
GFR: 71.44 mL/min (ref >60.00)
GLUCOSE: 95 mg/dL (ref 70–99)
POTASSIUM: 5 meq/L (ref 3.5–5.1)
Sodium: 137 mEq/L (ref 135–145)
TOTAL PROTEIN: 7.6 g/dL (ref 6.0–8.3)

## 2017-08-01 LAB — SEDIMENTATION RATE: Sed Rate: 27 mm/hr (ref 0–30)

## 2017-08-01 NOTE — Patient Instructions (Signed)
Because you were given broad  spectrum antibiotics,  daily use of Probiotics for  3 weeks is advised to reduce the risk of developing C dificile colitis,, an infectious colitis caused by killing off all of the goo bacteria .  Align, Floraque or Mattel[employer]lle,  Or a generic equivalent  Are all excellent choices    We will try got get your repeat CT ordered [facility]

## 2017-08-01 NOTE — Progress Notes (Signed)
Subjective:  Patient ID: Erin Aguilar, female    DOB: June 12, 1950  Age: 67 y.o. MRN: [id number]  CC: The primary encounter diagnosis was Diverticulitis. Diagnoses of Hospital discharge follow-up and Acute diverticulitis were also pertinent to this visit.  HPI Erin Aguilar presents for hospital follow up.  Admitted to [facility] with diverticulitis on April 4 and treated for 2 days with IV Zosyn .  Concern for possible sigmoid abscess raised o admission  Imaging but CT was reviewed for surgery and  Abscess was not appreciated.  She continued on empiric abx and was discharged on April 5 with rx for aumgentin and miralax ,  which she finished last week.  Bowel are moving regularly .  Sh denies pain , fevers and nausea. Follow up CT was recommended.   History of recent illness:  She had a 5 day history of LLQ pain that sh initially attributed to muscle strain,  But it did not improve so she sought medical attention on April 2 and was given outpatient treatment with augmentin , did not improve, so went to [facility]   Outpatient Medications Prior to Visit  Medication Sig Dispense Refill  . aspirin 81 MG chewable tablet Chew 81 mg by mouth.    Marland Kitchen atorvastatin (LIPITOR) 80 MG tablet TAKE 1 TABLET BY MOUTH ONCE DAILY. 90 tablet 1  . butalbital-acetaminophen-caffeine (FIORICET WITH CODEINE) 50-325-40-30 MG capsule TAKE 1 CAPSULE BY MOUTH EVERY 6 HOURS AS NEEDED 60 capsule 2  . calcium-vitamin D (OSCAL) 250-125 MG-UNIT per tablet Take 1 tablet by mouth daily.    . cetirizine (ZYRTEC) 10 MG tablet Take 10 mg by mouth daily.    . Cholecalciferol (VITAMIN D3) 2000 UNITS capsule Take 2,000 Units by mouth daily.     . cyanocobalamin (,VITAMIN B-12,) 1000 MCG/ML injection Inject 1 mL (1,000 mcg total) into the muscle every 30 (thirty) days. X 4, THEN MONTHLY 10 mL 03  . DULoxetine (CYMBALTA) 20 MG capsule TAKE (1) CAPSULE BY MOUTH ONCE DAILY. 90 capsule 0  . escitalopram (LEXAPRO) 20 MG  tablet TAKE 1 TABLET BY MOUTH ONCE DAILY. 30 tablet 5  . esomeprazole (NEXIUM) 40 MG capsule TAKE 1 CAPSULE IN THE MORNING BEFORE BREAKFAST. 90 capsule 1  . fluticasone (FLONASE) 50 MCG/ACT nasal spray Place 2 sprays into both nostrils daily. 16 g 6  . Multiple Vitamin (MULTIVITAMIN) tablet Take 1 tablet by mouth daily.    . pramipexole (MIRAPEX) 0.25 MG tablet TAKE ONE TABLET BY MOUTH AT BEDTIME. 90 tablet 0  . promethazine (PHENERGAN) 25 MG tablet TAKE (1) TABLET BY MOUTH EVERY SIX HOURS AS NEEDED. 30 tablet 3  . Syringe/Needle, Disp, (SYRINGE 3CC/25GX1") 25G X 1" 3 ML MISC Use for b12 injections 50 each 0  . UNABLE TO FIND Hemp Cannabinoid extract 1/2 dropper sublingual once daily for arthritis pain.    Marland Kitchen amoxicillin-clavulanate (AUGMENTIN) 875-125 MG tablet Take 1 tablet by mouth 2 (two) times daily. (Patient not taking: Reported on 08/01/2017) 20 tablet 0   Facility-Administered Medications Prior to Visit  Medication Dose Route Frequency Provider Last Rate Last Dose  . 0.9 %  sodium chloride infusion  500 mL Intravenous Continuous Doran Stabler, MD        Review of Systems;  Patient denies headache, fevers, malaise, unintentional weight loss, skin rash, eye pain, sinus congestion and sinus pain, sore throat, dysphagia,  hemoptysis , cough, dyspnea, wheezing, chest pain, palpitations, orthopnea, edema, abdominal pain, nausea, melena, diarrhea,  constipation, flank pain, dysuria, hematuria, urinary  Frequency, nocturia, numbness, tingling, seizures,  Focal weakness, Loss of consciousness,  Tremor, insomnia, depression, anxiety, and suicidal ideation.      Objective:  BP 112/78 (BP Location: Left Arm, Patient Position: Sitting, Cuff Size: Normal)   Pulse 67   Temp 97.8 F (36.6 C) (Oral)   Resp 15   Ht 5' 2.25" (1.581 m)   Wt 184 lb 3.2 oz (83.6 kg)   SpO2 97%   BMI 33.42 kg/m   BP Readings from Last 3 Encounters:  08/01/17 112/78  07/17/17 108/70  04/19/17 120/68    Wt  Readings from Last 3 Encounters:  08/01/17 184 lb 3.2 oz (83.6 kg)  07/17/17 187 lb 8 oz (85 kg)  04/19/17 185 lb 9.6 oz (84.2 kg)    General appearance: alert, cooperative and appears stated age Ears: normal TM's and external ear canals both ears Throat: lips, mucosa, and tongue normal; teeth and gums normal Neck: no adenopathy, no carotid bruit, supple, symmetrical, trachea midline and thyroid not enlarged, symmetric, no tenderness/mass/nodules Back: symmetric, no curvature. ROM normal. No CVA tenderness. Lungs: clear to auscultation bilaterally Heart: regular rate and rhythm, S1, S2 normal, no murmur, click, rub or gallop Abdomen: soft, non-tender; bowel sounds normal; no LLQ tenderness or  masses,  no organomegaly Pulses: 2+ and symmetric Skin: Skin color, texture, turgor normal. No rashes or lesions Lymph nodes: Cervical, supraclavicular, and axillary nodes normal.  Lab Results  Component Value Date   HGBA1C 5.7 12/14/2014   HGBA1C 5.8 07/23/2013    Lab Results  Component Value Date   CREATININE 0.84 08/01/2017   CREATININE 0.79 04/19/2017   CREATININE 0.65 09/06/2015    Lab Results  Component Value Date   WBC 6.5 08/01/2017   HGB 12.9 08/01/2017   HCT 38.1 08/01/2017   PLT 270.0 08/01/2017   GLUCOSE 95 08/01/2017   CHOL 112 04/19/2017   TRIG 129.0 04/19/2017   HDL 54.80 04/19/2017   LDLDIRECT 106.0 06/10/2012   LDLCALC 31 04/19/2017   ALT 29 08/01/2017   AST 26 08/01/2017   NA 137 08/01/2017   K 5.0 08/01/2017   CL 104 08/01/2017   CREATININE 0.84 08/01/2017   BUN 21 08/01/2017   CO2 26 08/01/2017   TSH 1.51 09/07/2016   HGBA1C 5.7 12/14/2014    Mm Digital Screening Bilateral  Result Date: 11/06/2016 CLINICAL DATA:  Screening. EXAM: DIGITAL SCREENING BILATERAL MAMMOGRAM WITH CAD COMPARISON:  Previous exam(s). ACR Breast Density Category b: There are scattered areas of fibroglandular density. FINDINGS: There are no findings suspicious for malignancy.  Images were processed with CAD. IMPRESSION: No mammographic evidence of malignancy. A result letter of this screening mammogram will be mailed directly to the patient. RECOMMENDATION: Screening mammogram in one year. (Code:SM-B-01Y) BI-RADS CATEGORY  1: Negative. Electronically Signed   By: Margarette Canada M.D.   On: 11/06/2016 13:31    Assessment & Plan:   Problem List Items Addressed This Visit    Hospital discharge follow-up    Patient is stable post discharge from [facility] on April for diverticulitis. She  has no new issues or questions about her discharge plans.  ESR and labs are normal today.  RepeatCT has been ordered,  To be done at [facility] for continuity.       Acute diverticulitis    Resolved clinically after IV and oral antibiotics.  Question of abscess raised,  Repeat CT was recommended and has been ordered. Patient advised that any  future episodes of LLQ pain need to be evaluated fully with CT to document recurrence of diverticulitis /abscess and NOT treated empirically        Other Visit Diagnoses    Diverticulitis    -  Primary   Relevant Orders   Sedimentation rate (Completed)   CBC with Differential/Platelet (Completed)   Comprehensive metabolic panel (Completed)   CT Abdomen Pelvis W Contrast      I have discontinued Hassan Rowan A. Geil's amoxicillin-clavulanate. I am also having her maintain her multivitamin, cetirizine, calcium-vitamin D, Vitamin D3, UNABLE TO FIND, fluticasone, SYRINGE 3CC/25GX1", DULoxetine, promethazine, cyanocobalamin, escitalopram, atorvastatin, pramipexole, esomeprazole, butalbital-acetaminophen-caffeine, and aspirin. We will continue to administer sodium chloride.  No orders of the defined types were placed in this encounter.   Medications Discontinued During This Encounter  Medication Reason  . amoxicillin-clavulanate (AUGMENTIN) 875-125 MG tablet Completed Course    Follow-up: No follow-ups on file.   Crecencio Mc, MD

## 2017-08-03 DIAGNOSIS — Z09 Encounter for follow-up examination after completed treatment for conditions other than malignant neoplasm: Secondary | ICD-10-CM | POA: Insufficient documentation

## 2017-08-03 NOTE — Assessment & Plan Note (Addendum)
Resolved clinically after IV and oral antibiotics.  Question of abscess raised,  Repeat CT was recommended and has been ordered. Patient advised that any future episodes of LLQ pain need to be evaluated fully with CT to document recurrence of diverticulitis /abscess and NOT treated empirically   Lab Results  Component Value Date   ESRSEDRATE 27 08/01/2017   Lab Results  Component Value Date   WBC 6.5 08/01/2017   HGB 12.9 08/01/2017   HCT 38.1 08/01/2017   MCV 100.1 (H) 08/01/2017   PLT 270.0 08/01/2017

## 2017-08-03 NOTE — Assessment & Plan Note (Signed)
Patient is stable post discharge from [facility] on April for diverticulitis. She  has no new issues or questions about her discharge plans.  ESR and labs are normal today.  RepeatCT has been ordered,  To be done at [facility] for continuity.

## 2017-08-15 ENCOUNTER — Telehealth: Payer: Self-pay | Admitting: Internal Medicine

## 2017-08-15 NOTE — Telephone Encounter (Signed)
Her follow up abdominal CT shows that her diverticulitis has resolved almost 100%.

## 2017-08-15 NOTE — Telephone Encounter (Signed)
LMTCB. PEC may speak with pt.  

## 2017-08-16 NOTE — Telephone Encounter (Signed)
Spoke with and informed her of her CT scan results. Pt gave a verbal understanding.

## 2017-09-12 ENCOUNTER — Ambulatory Visit (INDEPENDENT_AMBULATORY_CARE_PROVIDER_SITE_OTHER): Payer: Medicare Other | Admitting: Internal Medicine

## 2017-09-12 ENCOUNTER — Encounter: Payer: Self-pay | Admitting: Internal Medicine

## 2017-09-12 VITALS — BP 102/68 | HR 64 | Temp 98.1°F | Resp 15 | Ht 62.25 in | Wt 182.8 lb

## 2017-09-12 DIAGNOSIS — M810 Age-related osteoporosis without current pathological fracture: Secondary | ICD-10-CM | POA: Diagnosis not present

## 2017-09-12 DIAGNOSIS — Z1231 Encounter for screening mammogram for malignant neoplasm of breast: Secondary | ICD-10-CM

## 2017-09-12 DIAGNOSIS — Z0000 Encounter for general adult medical examination without abnormal findings: Secondary | ICD-10-CM | POA: Diagnosis not present

## 2017-09-12 DIAGNOSIS — Z1239 Encounter for other screening for malignant neoplasm of breast: Secondary | ICD-10-CM

## 2017-09-12 NOTE — Progress Notes (Signed)
Patient ID: Erin Aguilar, female    DOB: 22-Feb-1951  Age: 67 y.o. MRN: [id number]  The patient is here for annual preventive wellness examination and management of other chronic and acute problems.   The risk factors are reflected in the social history.  The roster of all physicians providing medical care to patient - is listed in the Snapshot section of the chart.  Activities of daily living:  The patient is 100% independent in all ADLs: dressing, toileting, feeding as well as independent mobility  Home safety : The patient has smoke detectors in the home. They wear seatbelts.  There are no firearms at home. There is no violence in the home.   There is no risks for hepatitis, STDs or HIV. There is no   history of blood transfusion. They have no travel history to infectious disease endemic areas of the world.  The patient has seen their dentist in the last six month. They have seen their eye doctor in the last year. They admit to slight hearing difficulty with regard to whispered voices and some television programs.  They have deferred audiologic testing in the last year.  They do not  have excessive sun exposure. Discussed the need for sun protection: hats, long sleeves and use of sunscreen if there is significant sun exposure.   Diet: the importance of a healthy diet is discussed. They do have a healthy diet.  The benefits of regular aerobic exercise were discussed. She exercises 2/week with water aerobics,  Does yardwork the other days .   Depression screen: there are no signs or vegatative symptoms of depression- irritability, change in appetite, anhedonia, sadness/tearfullness.  Cognitive assessment: the patient manages all their financial and personal affairs and is actively engaged. They could relate day,date,year and events; recalled 2/3 objects at 3 minutes; performed clock-face test normally.  Patient has Living will and POA,  Both are in chart !!!   The following portions of the  patient's history were reviewed and updated as appropriate: allergies, current medications, past family history, past medical history,  past surgical history, past social history  and problem list.  Visual acuity was not assessed per patient preference since she has regular follow up with her ophthalmologist. Hearing and body mass index were assessed and reviewed.   During the course of the visit the patient was educated and counseled about appropriate screening and preventive services including : fall prevention , diabetes screening, nutrition counseling, colorectal cancer screening, and recommended immunizations.    Measles immunity discussed.  She had measles as a child and remebers the illness.   CC: There were no encounter diagnoses.  History Erin Aguilar has a past medical history of Allergy, Arthritis, Chicken pox, Colon polyp, Diverticulitis, GERD (gastroesophageal reflux disease), H/O: pertussis, Headache(784.0), Hemochromatosis carrier, Hemorrhoids, internal, with bleeding, Hyperlipidemia, Migraines, and UTI (lower urinary tract infection).   She has a past surgical history that includes Cholecystectomy (1976); Abdominal hysterectomy (1988); Gastric bypass (03/2010); Hernia repair (2008); and Joint replacement (Bilateral, 02/26/2014).   Her family history includes Arthritis in her father, maternal grandfather, maternal grandmother, and mother; Cancer in her brother and mother; Cancer (age of onset: 25) in her sister; Diabetes in her maternal grandfather and maternal grandmother; Early death (age of onset: 30) in her mother; Heart disease in her maternal grandfather and maternal grandmother; Kidney disease in her brother; Stroke in her father.She reports that she quit smoking about 30 years ago. She has never used smokeless tobacco. She reports that she drinks  about 1.5 oz of alcohol per week. She reports that she does not use drugs.  Outpatient Medications Prior to Visit  Medication Sig  Dispense Refill  . aspirin 81 MG chewable tablet Chew 81 mg by mouth.    Marland Kitchen atorvastatin (LIPITOR) 80 MG tablet TAKE 1 TABLET BY MOUTH ONCE DAILY. 90 tablet 1  . butalbital-acetaminophen-caffeine (FIORICET WITH CODEINE) 50-325-40-30 MG capsule TAKE 1 CAPSULE BY MOUTH EVERY 6 HOURS AS NEEDED 60 capsule 2  . calcium-vitamin D (OSCAL) 250-125 MG-UNIT per tablet Take 1 tablet by mouth daily.    . Cholecalciferol (VITAMIN D3) 2000 UNITS capsule Take 2,000 Units by mouth daily.     . cyanocobalamin (,VITAMIN B-12,) 1000 MCG/ML injection Inject 1 mL (1,000 mcg total) into the muscle every 30 (thirty) days. X 4, THEN MONTHLY 10 mL 03  . DULoxetine (CYMBALTA) 20 MG capsule TAKE (1) CAPSULE BY MOUTH ONCE DAILY. 90 capsule 0  . escitalopram (LEXAPRO) 20 MG tablet TAKE 1 TABLET BY MOUTH ONCE DAILY. 30 tablet 5  . esomeprazole (NEXIUM) 40 MG capsule TAKE 1 CAPSULE IN THE MORNING BEFORE BREAKFAST. 90 capsule 1  . Multiple Vitamin (MULTIVITAMIN) tablet Take 1 tablet by mouth daily.    . pramipexole (MIRAPEX) 0.25 MG tablet TAKE ONE TABLET BY MOUTH AT BEDTIME. 90 tablet 0  . promethazine (PHENERGAN) 25 MG tablet TAKE (1) TABLET BY MOUTH EVERY SIX HOURS AS NEEDED. 30 tablet 3  . Syringe/Needle, Disp, (SYRINGE 3CC/25GX1") 25G X 1" 3 ML MISC Use for b12 injections 50 each 0  . UNABLE TO FIND Hemp Cannabinoid extract 1/2 dropper sublingual once daily for arthritis pain.    . cetirizine (ZYRTEC) 10 MG tablet Take 10 mg by mouth daily.    . fluticasone (FLONASE) 50 MCG/ACT nasal spray Place 2 sprays into both nostrils daily. (Patient not taking: Reported on 09/12/2017) 16 g 6   Facility-Administered Medications Prior to Visit  Medication Dose Route Frequency Provider Last Rate Last Dose  . 0.9 %  sodium chloride infusion  500 mL Intravenous Continuous Danis, Starr Lake III, MD        Review of Systems   Patient denies headache, fevers, malaise, unintentional weight loss, skin rash, eye pain, sinus congestion and  sinus pain, sore throat, dysphagia,  hemoptysis , cough, dyspnea, wheezing, chest pain, palpitations, orthopnea, edema, abdominal pain, nausea, melena, diarrhea, constipation, flank pain, dysuria, hematuria, urinary  Frequency, nocturia, numbness, tingling, seizures,  Focal weakness, Loss of consciousness,  Tremor, insomnia, depression, anxiety, and suicidal ideation.      Objective:  BP 102/68 (BP Location: Left Arm, Patient Position: Sitting, Cuff Size: Large)   Pulse 64   Temp 98.1 F (36.7 C) (Oral)   Resp 15   Ht 5' 2.25" (1.581 m)   Wt 182 lb 12.8 oz (82.9 kg)   SpO2 93%   BMI 33.17 kg/m   Physical Exam   General appearance: alert, cooperative and appears stated age Head: Normocephalic, without obvious abnormality, atraumatic Eyes: conjunctivae/corneas clear. PERRL, EOM's intact. Fundi benign. Ears: normal TM's and external ear canals both ears Nose: Nares normal. Septum midline. Mucosa normal. No drainage or sinus tenderness. Throat: lips, mucosa, and tongue normal; teeth and gums normal Neck: no adenopathy, no carotid bruit, no JVD, supple, symmetrical, trachea midline and thyroid not enlarged, symmetric, no tenderness/mass/nodules Lungs: clear to auscultation bilaterally Breasts: normal appearance, no masses or tenderness Heart: regular rate and rhythm, S1, S2 normal, no murmur, click, rub or gallop Abdomen: soft, non-tender; bowel sounds  normal; no masses,  no organomegaly Extremities: extremities normal, atraumatic, no cyanosis or edema Pulses: 2+ and symmetric Skin: Skin color, texture, turgor normal. No rashes or lesions Neurologic: Alert and oriented X 3, normal strength and tone. Normal symmetric reflexes. Normal coordination and gait.      Assessment & Plan:   Problem List Items Addressed This Visit    None      I have discontinued Steward Drone A. Vanalstine's cetirizine and fluticasone. I am also having her maintain her multivitamin, calcium-vitamin D, Vitamin D3,  UNABLE TO FIND, SYRINGE 3CC/25GX1", DULoxetine, promethazine, cyanocobalamin, escitalopram, atorvastatin, pramipexole, esomeprazole, butalbital-acetaminophen-caffeine, and aspirin. We will continue to administer sodium chloride.  No orders of the defined types were placed in this encounter.   Medications Discontinued During This Encounter  Medication Reason  . cetirizine (ZYRTEC) 10 MG tablet Patient has not taken in last 30 days  . fluticasone (FLONASE) 50 MCG/ACT nasal spray Patient has not taken in last 30 days    Follow-up: No follow-ups on file.   Sherlene Shams, MD

## 2017-09-12 NOTE — Patient Instructions (Addendum)
Try 25 mg benadryl one hour before bedtime for the post nasal drip  I have ordered your mammogram   The ShingRx vaccine is now available in local pharmacies and is much more protective thant Zostavaxs,  It is therefore ADVISED for all interested adults over 50 to prevent shingles    You do not need to worry about measles if you had it as a child  Health Maintenance for Postmenopausal Women Menopause is a normal process in which your reproductive ability comes to an end. This process happens gradually over a span of months to years, usually between the ages of 85 and 34. Menopause is complete when you have missed 12 consecutive menstrual periods. It is important to talk with your health care provider about some of the most common conditions that affect postmenopausal women, such as heart disease, cancer, and bone loss (osteoporosis). Adopting a healthy lifestyle and getting preventive care can help to promote your health and wellness. Those actions can also lower your chances of developing some of these common conditions. What should I know about menopause? During menopause, you may experience a number of symptoms, such as:  Moderate-to-severe hot flashes.  Night sweats.  Decrease in sex drive.  Mood swings.  Headaches.  Tiredness.  Irritability.  Memory problems.  Insomnia.  Choosing to treat or not to treat menopausal changes is an individual decision that you make with your health care provider. What should I know about hormone replacement therapy and supplements? Hormone therapy products are effective for treating symptoms that are associated with menopause, such as hot flashes and night sweats. Hormone replacement carries certain risks, especially as you become older. If you are thinking about using estrogen or estrogen with progestin treatments, discuss the benefits and risks with your health care provider. What should I know about heart disease and stroke? Heart disease,  heart attack, and stroke become more likely as you age. This may be due, in part, to the hormonal changes that your body experiences during menopause. These can affect how your body processes dietary fats, triglycerides, and cholesterol. Heart attack and stroke are both medical emergencies. There are many things that you can do to help prevent heart disease and stroke:  Have your blood pressure checked at least every 1-2 years. High blood pressure causes heart disease and increases the risk of stroke.  If you are 73-35 years old, ask your health care provider if you should take aspirin to prevent a heart attack or a stroke.  Do not use any tobacco products, including cigarettes, chewing tobacco, or electronic cigarettes. If you need help quitting, ask your health care provider.  It is important to eat a healthy diet and maintain a healthy weight. ? Be sure to include plenty of vegetables, fruits, low-fat dairy products, and lean protein. ? Avoid eating foods that are high in solid fats, added sugars, or salt (sodium).  Get regular exercise. This is one of the most important things that you can do for your health. ? Try to exercise for at least 150 minutes each week. The type of exercise that you do should increase your heart rate and make you sweat. This is known as moderate-intensity exercise. ? Try to do strengthening exercises at least twice each week. Do these in addition to the moderate-intensity exercise.  Know your numbers.Ask your health care provider to check your cholesterol and your blood glucose. Continue to have your blood tested as directed by your health care provider.  What should I know  about cancer screening? There are several types of cancer. Take the following steps to reduce your risk and to catch any cancer development as early as possible. Breast Cancer  Practice breast self-awareness. ? This means understanding how your breasts normally appear and feel. ? It also  means doing regular breast self-exams. Let your health care provider know about any changes, no matter how small.  If you are 67 or older, have a clinician do a breast exam (clinical breast exam or CBE) every year. Depending on your age, family history, and medical history, it may be recommended that you also have a yearly breast X-ray (mammogram).  If you have a family history of breast cancer, talk with your health care provider about genetic screening.  If you are at high risk for breast cancer, talk with your health care provider about having an MRI and a mammogram every year.  Breast cancer (BRCA) gene test is recommended for women who have family members with BRCA-related cancers. Results of the assessment will determine the need for genetic counseling and BRCA1 and for BRCA2 testing. BRCA-related cancers include these types: ? Breast. This occurs in males or females. ? Ovarian. ? Tubal. This may also be called fallopian tube cancer. ? Cancer of the abdominal or pelvic lining (peritoneal cancer). ? Prostate. ? Pancreatic.  Cervical, Uterine, and Ovarian Cancer Your health care provider may recommend that you be screened regularly for cancer of the pelvic organs. These include your ovaries, uterus, and vagina. This screening involves a pelvic exam, which includes checking for microscopic changes to the surface of your cervix (Pap test).  For women ages 21-65, health care providers may recommend a pelvic exam and a Pap test every three years. For women ages 55-65, they may recommend the Pap test and pelvic exam, combined with testing for human papilloma virus (HPV), every five years. Some types of HPV increase your risk of cervical cancer. Testing for HPV may also be done on women of any age who have unclear Pap test results.  Other health care providers may not recommend any screening for nonpregnant women who are considered low risk for pelvic cancer and have no symptoms. Ask your health  care provider if a screening pelvic exam is right for you.  If you have had past treatment for cervical cancer or a condition that could lead to cancer, you need Pap tests and screening for cancer for at least 20 years after your treatment. If Pap tests have been discontinued for you, your risk factors (such as having a new sexual partner) need to be reassessed to determine if you should start having screenings again. Some women have medical problems that increase the chance of getting cervical cancer. In these cases, your health care provider may recommend that you have screening and Pap tests more often.  If you have a family history of uterine cancer or ovarian cancer, talk with your health care provider about genetic screening.  If you have vaginal bleeding after reaching menopause, tell your health care provider.  There are currently no reliable tests available to screen for ovarian cancer.  Lung Cancer Lung cancer screening is recommended for adults 29-36 years old who are at high risk for lung cancer because of a history of smoking. A yearly low-dose CT scan of the lungs is recommended if you:  Currently smoke.  Have a history of at least 30 pack-years of smoking and you currently smoke or have quit within the past 15 years. A pack-year  is smoking an average of one pack of cigarettes per day for one year.  Yearly screening should:  Continue until it has been 15 years since you quit.  Stop if you develop a health problem that would prevent you from having lung cancer treatment.  Colorectal Cancer  This type of cancer can be detected and can often be prevented.  Routine colorectal cancer screening usually begins at age 68 and continues through age 31.  If you have risk factors for colon cancer, your health care provider may recommend that you be screened at an earlier age.  If you have a family history of colorectal cancer, talk with your health care provider about genetic  screening.  Your health care provider may also recommend using home test kits to check for hidden blood in your stool.  A small camera at the end of a tube can be used to examine your colon directly (sigmoidoscopy or colonoscopy). This is done to check for the earliest forms of colorectal cancer.  Direct examination of the colon should be repeated every 5-10 years until age 72. However, if early forms of precancerous polyps or small growths are found or if you have a family history or genetic risk for colorectal cancer, you may need to be screened more often.  Skin Cancer  Check your skin from head to toe regularly.  Monitor any moles. Be sure to tell your health care provider: ? About any new moles or changes in moles, especially if there is a change in a mole's shape or color. ? If you have a mole that is larger than the size of a pencil eraser.  If any of your family members has a history of skin cancer, especially at a young age, talk with your health care provider about genetic screening.  Always use sunscreen. Apply sunscreen liberally and repeatedly throughout the day.  Whenever you are outside, protect yourself by wearing long sleeves, pants, a wide-brimmed hat, and sunglasses.  What should I know about osteoporosis? Osteoporosis is a condition in which bone destruction happens more quickly than new bone creation. After menopause, you may be at an increased risk for osteoporosis. To help prevent osteoporosis or the bone fractures that can happen because of osteoporosis, the following is recommended:  If you are 32-43 years old, get at least 1,000 mg of calcium and at least 600 mg of vitamin D per day.  If you are older than age 32 but younger than age 80, get at least 1,200 mg of calcium and at least 600 mg of vitamin D per day.  If you are older than age 31, get at least 1,200 mg of calcium and at least 800 mg of vitamin D per day.  Smoking and excessive alcohol intake  increase the risk of osteoporosis. Eat foods that are rich in calcium and vitamin D, and do weight-bearing exercises several times each week as directed by your health care provider. What should I know about how menopause affects my mental health? Depression may occur at any age, but it is more common as you become older. Common symptoms of depression include:  Low or sad mood.  Changes in sleep patterns.  Changes in appetite or eating patterns.  Feeling an overall lack of motivation or enjoyment of activities that you previously enjoyed.  Frequent crying spells.  Talk with your health care provider if you think that you are experiencing depression. What should I know about immunizations? It is important that you get and  maintain your immunizations. These include:  Tetanus, diphtheria, and pertussis (Tdap) booster vaccine.  Influenza every year before the flu season begins.  Pneumonia vaccine.  Shingles vaccine.  Your health care provider may also recommend other immunizations. This information is not intended to replace advice given to you by your health care provider. Make sure you discuss any questions you have with your health care provider. Document Released: 05/26/2005 Document Revised: 10/22/2015 Document Reviewed: 01/05/2015 Elsevier Interactive Patient Education  2018 [employer].

## 2017-09-15 NOTE — Assessment & Plan Note (Signed)
Annual comprehensive preventive exam was done as well as an evaluation and management of chronic conditions .  During the course of the visit the patient was educated and counseled about appropriate screening and preventive services including :  diabetes screening, lipid analysis with projected  10 year  risk for CAD , nutrition counseling, breast, cervical and colorectal cancer screening, and recommended immunizations.  Printed recommendations for health maintenance screenings was given  Mammogram ordered   DEXA ordered

## 2017-09-27 ENCOUNTER — Encounter: Payer: Self-pay | Admitting: Internal Medicine

## 2017-10-16 ENCOUNTER — Ambulatory Visit
Admission: RE | Admit: 2017-10-16 | Discharge: 2017-10-16 | Disposition: A | Discharge status: Home / Self Care | Payer: Medicare Other | Source: Ambulatory Visit | Attending: Internal Medicine | Admitting: Internal Medicine

## 2017-10-16 ENCOUNTER — Encounter (INDEPENDENT_AMBULATORY_CARE_PROVIDER_SITE_OTHER): Payer: Self-pay

## 2017-10-16 DIAGNOSIS — M810 Age-related osteoporosis without current pathological fracture: Secondary | ICD-10-CM | POA: Diagnosis not present

## 2017-10-16 DIAGNOSIS — Z1239 Encounter for other screening for malignant neoplasm of breast: Secondary | ICD-10-CM

## 2017-10-16 DIAGNOSIS — Z1231 Encounter for screening mammogram for malignant neoplasm of breast: Secondary | ICD-10-CM | POA: Diagnosis not present

## 2017-10-20 ENCOUNTER — Other Ambulatory Visit: Payer: Self-pay | Admitting: Internal Medicine

## 2017-10-26 ENCOUNTER — Ambulatory Visit: Payer: Self-pay

## 2017-10-26 NOTE — Telephone Encounter (Signed)
Phone call rec'd from pt.  Requested an appt. to discuss anxiety over husband's declining health;  having increased confusion.  Reported she was in the ER with him until 2:00 AM, this morning.  Stated she is taking Cymbalta and Lexapro, and questioned if she needs medication change or adjustment.  Reported "feeling very uptight, frustrated, and like I could fall apart, at times."  Denies having panic attacks. Denies any feelings that she could hurt herself or others.  Stated she doesn't feel she can ask for help from family or friends, that work.   Scheduled appt. 10/30/17 with PCP.  Advised to call upon family / friends for support; call back if worsening in symptoms.  Verb. Understanding.  Agrees with plan.       Reason for Disposition . [1] Symptoms of anxiety or panic attack AND [2] is a chronic symptom (recurrent or ongoing AND present > 4 weeks)  Answer Assessment - Initial Assessment Questions 1. CONCERN: "What happened that made you call today?"    Upset about husbands declining health 2. ANXIETY SYMPTOM SCREENING: "Can you describe how you have been feeling?"  (e.g., tense, restless, panicky, anxious, keyed up, trouble sleeping, trouble concentrating)      I get so uptight; I feel like I could fall apart; feel very frustrated  3. ONSET: "How long have you been feeling this way?"     Feeling this way over a few weeks 4. RECURRENT: "Have you felt this way before?"  If yes: "What happened that time?" "What helped these feelings go away in the past?"      Husbands mental status has declined 5. RISK OF HARM - SUICIDAL IDEATION:  "Do you ever have thoughts of hurting or killing yourself?"  (e.g., yes, no, no but preoccupation with thoughts about death)   - INTENT:  "Do you have thoughts of hurting or killing yourself right NOW?" (e.g., yes, no, N/A)   - PLAN: "Do you have a specific plan for how you would do this?" (e.g., gun, knife, overdose, no plan, N/A)     Denied any plan to hurt self 6.  RISK OF HARM - HOMICIDAL IDEATION:  "Do you ever have thoughts of hurting or killing someone else?"  (e.g., yes, no, no but preoccupation with thoughts about death)   - INTENT:  "Do you have thoughts of hurting or killing someone right NOW?" (e.g., yes, no, N/A)   - PLAN: "Do you have a specific plan for how you would do this?" (e.g., gun, knife, no plan, N/A)      Denies  7. FUNCTIONAL IMPAIRMENT: "How have things been going for you overall in your life? Have you had any more difficulties than usual doing your normal daily activities?"  (e.g., better, same, worse; self-care, school, work, interactions)     Gradual change in stress level and responsibilities due to husband's declining health.   8. SUPPORT: "Who is with you now?" "Who do you live with?" "Do you have family or friends nearby who you can talk to?"      Stated can talk to friends and family, but doesn't feel she can ask for much help with her husband, when they work.  9. THERAPIST: "Do you have a counselor or therapist? Name?"     Not interested in Therapy; does not feel it would help at this time  10. STRESSORS: "Has there been any new stress or recent changes in your life?"      Husbands health 11. CAFFEINE ABUSE: "  Do you drink caffeinated beverages, and how much each day?" (e.g., coffee, tea, colas)       Not asked 12. SUBSTANCE ABUSE: "Do you use any illegal drugs or alcohol?"      Not asked / didn't feel appropriate to ask in this situation 13. OTHER SYMPTOMS: "Do you have any other physical symptoms right now?" (e.g., chest pain, palpitations, difficulty breathing, fever)       Denies any physical complaints 14. PREGNANCY: "Is there any chance you are pregnant?" "When was your last menstrual period?"      n/a  Protocols used: ANXIETY AND PANIC ATTACK-A-AH

## 2017-10-30 ENCOUNTER — Encounter: Payer: Self-pay | Admitting: Internal Medicine

## 2017-10-30 ENCOUNTER — Ambulatory Visit: Payer: Medicare Other | Admitting: Internal Medicine

## 2017-10-30 ENCOUNTER — Telehealth: Payer: Self-pay | Admitting: Internal Medicine

## 2017-10-30 DIAGNOSIS — F411 Generalized anxiety disorder: Secondary | ICD-10-CM | POA: Diagnosis not present

## 2017-10-30 MED ORDER — ALPRAZOLAM 0.25 MG PO TABS: 0.2500 mg | ORAL_TABLET | Freq: Two times a day (BID) | ORAL | 0 refills | Status: DC | PRN

## 2017-10-30 MED ORDER — DULOXETINE HCL 30 MG PO CPEP: 30.0000 mg | ORAL_CAPSULE | Freq: Every day | ORAL | 1 refills | Status: DC

## 2017-10-30 NOTE — Telephone Encounter (Signed)
Copied from CRM [id number]. Topic: Quick Communication - Rx Refill/Question >> Oct 30, 2017  6:09 PM Erin Aguilar, Erin Aguilar: Medication: ALPRAZolam [name](XANAX) 0.25 MG tablet [[id number]][[id number]]   Has the patient contacted their pharmacy? Yes.   (Agent: If no, request that the patient contact the pharmacy for the refill.) (Agent: If yes, when and what did the pharmacy advise?)  Preferred Pharmacy (with phone number or street name): [facility]  Agent: Please be advised that RX refills may take up to 3 business days. We ask that you follow-up with your pharmacy.

## 2017-10-30 NOTE — Patient Instructions (Signed)
I have increased the dose of cymbalta to 30 mg daily  I have added alprazolam to use sparingly,  As needed for insomnia or extreme irritation/anxiety

## 2017-10-30 NOTE — Progress Notes (Signed)
Subjective:  Patient ID: Erin Aguilar, female    DOB: 09-Jun-1950  Age: 67 y.o. MRN: [id number]  CC: The encounter diagnosis was Anxiety state.  HPI Erin Aguilar presents for management of chronic anxiety  Which has been aggravated by  her husband's recent [facility] with mental status changes secondary to hypertensive crisis resulting in lacunar infarcts. Since his discharge home he has continued to have confusion, poor balance, increased urinary incontinence, and leg weakness. .  She has had to assume most of the responsibilities at home including driving,  Which he has not relinquished very well (he is back seat driving) .  She is not sleeping well at night because of anxiety about him getting up without assistance to use the bathroom. She denies suicidality, irritability and daytime sleepiness.    Outpatient Medications Prior to Visit  Medication Sig Dispense Refill  . aspirin 81 MG chewable tablet Chew 81 mg by mouth.    Marland Kitchen. atorvastatin (LIPITOR) 80 MG tablet TAKE 1 TABLET BY MOUTH ONCE DAILY. 90 tablet 1  . butalbital-acetaminophen-caffeine (FIORICET WITH CODEINE) 50-325-40-30 MG capsule TAKE 1 CAPSULE BY MOUTH EVERY 6 HOURS AS NEEDED 60 capsule 2  . calcium-vitamin D (OSCAL) 250-125 MG-UNIT per tablet Take 1 tablet by mouth daily.    . Cholecalciferol (VITAMIN D3) 2000 UNITS capsule Take 2,000 Units by mouth daily.     . cyanocobalamin (,VITAMIN B-12,) 1000 MCG/ML injection Inject 1 mL (1,000 mcg total) into the muscle every 30 (thirty) days. X 4, THEN MONTHLY 10 mL 03  . escitalopram (LEXAPRO) 20 MG tablet TAKE 1 TABLET BY MOUTH ONCE DAILY. 30 tablet 5  . esomeprazole (NEXIUM) 40 MG capsule TAKE 1 CAPSULE IN THE MORNING BEFORE BREAKFAST. 90 capsule 1  . Multiple Vitamin (MULTIVITAMIN) tablet Take 1 tablet by mouth daily.    . pramipexole (MIRAPEX) 0.25 MG tablet TAKE ONE TABLET BY MOUTH AT BEDTIME. 90 tablet 0  . promethazine (PHENERGAN) 25 MG tablet TAKE (1) TABLET  BY MOUTH EVERY SIX HOURS AS NEEDED. 30 tablet 3  . Syringe/Needle, Disp, (SYRINGE 3CC/25GX1") 25G X 1" 3 ML MISC Use for b12 injections 50 each 0  . UNABLE TO FIND Hemp Cannabinoid extract 1/2 dropper sublingual once daily for arthritis pain.    . DULoxetine (CYMBALTA) 20 MG capsule TAKE (1) CAPSULE BY MOUTH ONCE DAILY. 90 capsule 1   Facility-Administered Medications Prior to Visit  Medication Dose Route Frequency Provider Last Rate Last Dose  . 0.9 %  sodium chloride infusion  500 mL Intravenous Continuous Sherrilyn Ristanis, Henry L III, MD        Review of Systems;  Patient denies headache, fevers, malaise, unintentional weight loss, skin rash, eye pain, sinus congestion and sinus pain, sore throat, dysphagia,  hemoptysis , cough, dyspnea, wheezing, chest pain, palpitations, orthopnea, edema, abdominal pain, nausea, melena, diarrhea, constipation, flank pain, dysuria, hematuria, urinary  Frequency, nocturia, numbness, tingling, seizures,  Focal weakness, Loss of consciousness,  Tremor, insomnia, depression, anxiety, and suicidal ideation.      Objective:  BP 110/66 (BP Location: Left Arm, Patient Position: Sitting, Cuff Size: Large)   Pulse 60   Temp 98.2 F (36.8 C) (Oral)   Resp 15   Ht 5' 2.25" (1.581 m)   Wt 188 lb (85.3 kg)   SpO2 96%   BMI 34.11 kg/m   BP Readings from Last 3 Encounters:  10/30/17 110/66  09/12/17 102/68  08/01/17 112/78    Wt Readings from Last 3  Encounters:  10/30/17 188 lb (85.3 kg)  09/12/17 182 lb 12.8 oz (82.9 kg)  08/01/17 184 lb 3.2 oz (83.6 kg)    General appearance: alert, cooperative and appears stated age Ears: normal TM's and external ear canals both ears Throat: lips, mucosa, and tongue normal; teeth and gums normal Neck: no adenopathy, no carotid bruit, supple, symmetrical, trachea midline and thyroid not enlarged, symmetric, no tenderness/mass/nodules Back: symmetric, no curvature. ROM normal. No CVA tenderness. Lungs: clear to auscultation  bilaterally Heart: regular rate and rhythm, S1, S2 normal, no murmur, click, rub or gallop Abdomen: soft, non-tender; bowel sounds normal; no masses,  no organomegaly Pulses: 2+ and symmetric Skin: Skin color, texture, turgor normal. No rashes or lesions Lymph nodes: Cervical, supraclavicular, and axillary nodes normal.  Lab Results  Component Value Date   HGBA1C 5.7 12/14/2014   HGBA1C 5.8 07/23/2013    Lab Results  Component Value Date   CREATININE 0.84 08/01/2017   CREATININE 0.79 04/19/2017   CREATININE 0.65 09/06/2015    Lab Results  Component Value Date   WBC 6.5 08/01/2017   HGB 12.9 08/01/2017   HCT 38.1 08/01/2017   PLT 270.0 08/01/2017   GLUCOSE 95 08/01/2017   CHOL 112 04/19/2017   TRIG 129.0 04/19/2017   HDL 54.80 04/19/2017   LDLDIRECT 106.0 06/10/2012   LDLCALC 31 04/19/2017   ALT 29 08/01/2017   AST 26 08/01/2017   NA 137 08/01/2017   K 5.0 08/01/2017   CL 104 08/01/2017   CREATININE 0.84 08/01/2017   BUN 21 08/01/2017   CO2 26 08/01/2017   TSH 1.51 09/07/2016   HGBA1C 5.7 12/14/2014    Dg Bone Density  Result Date: 10/16/2017 EXAM: DUAL X-RAY ABSORPTIOMETRY (DXA) FOR BONE MINERAL DENSITY IMPRESSION: Dear Dr Erin Aguilar, [name] completed a BMD test on 10/16/2017 using the [employer] Prodigy Advance DXA System (analysis version: 14.10) manufactured by [employer]. The following summarizes the results of our evaluation. PATIENT BIOGRAPHICAL: Name: [name], [name] Patient ID:  [id number] Birth Date: 1951-02-20 Height:     62.5 in. Weight:     185.8 lbs. Gender:      Female Exam Date:  10/16/2017 Indications: Advanced Age, bil knee replacements, Caucasian, Hysterectomy, Osteoarthritis, POSTmenopausal Fractures: Treatments: 81 MG ASPIRIN, multivitamin, Vitamin D ASSESSMENT: The BMD measured at Forearm Radius 33% is 0.700 g/cm2 with a T-score of -2.0. This patient is considered OSTEOPENIC according to World Health Organization [facility]) criteria. L2  & L4 were excluded due to  degenerative changes. The scan quality is limited by exclusions to the L spine. Site Region Measured Measured WHO Young Adult BMD Date       Age      Classification T-score AP Spine L1-L3 (L2) 10/16/2017 69.2 Osteopenia -1.8 0.945 g/cm2 DualFemur Neck Left 10/16/2017 69.2 Osteopenia -1.6 0.818 g/cm2 Left Forearm Radius 33% 10/16/2017 69.2 Osteopenia -2.0 0.700 g/cm2 World Health Organization [facility]) criteria for post-menopausal, Caucasian Women: Normal:       T-score at or above -1 SD Osteopenia:   T-score between -1 and -2.5 SD Osteoporosis: T-score at or below -2.5 SD RECOMMENDATIONS: 1. All patients should optimize calcium and vitamin D intake. 2. Consider FDA-approved medical therapies in postmenopausal women and men aged 6 years and older, based on the following: a. A hip or vertebral (clinical or morphometric) fracture b. T-score < -2.5 at the femoral neck or spine after appropriate evaluation to exclude secondary causes c. Low bone mass (T-score between -1.0 and -2.5 at  the femoral neck or spine) and a 10-year probability of a hip fracture > 3% or a 10-year probability of a major osteoporosis-related fracture > 20% based on the [place]-adapted WHO algorithm d. Clinician judgment and/or patient preferences may indicate treatment for people with 10-year fracture probabilities above or below these levels FOLLOW-UP: Patients with diagnosed cases of osteoporosis or at high risk for fracture should have regular bone mineral density tests. For patients eligible for Medicare, routine testing is allowed once every 2 years. The testing frequency can be increased to one year for patients who have rapidly progressing disease, those who are receiving or discontinuing medical therapy to restore bone mass, or have additional risk factors. I have reviewed this report, and agree with the above findings. Mark A. Tyron Russell, M.D. [facility] Radiology Dear Dr Erin Aguilar, Your patient DEONE OMAHONEY completed a  FRAX assessment on 10/16/2017 using the [facility] Prodigy Advance DXA System (analysis version: 14.10) manufactured by [employer]. The following summarizes the results of our evaluation. PATIENT BIOGRAPHICAL: Name: Nitzia, Perren Patient ID: [id number] Birth Date: 1950-10-01 Height:    62.5 in. Gender:     Female    Age:        4.2       Weight:    185.8 lbs. Ethnicity:  White                            Exam Date: 10/16/2017 FRAX* RESULTS:  (version: 3.5) 10-year Probability of Fracture1 Major Osteoporotic Fracture2 Hip Fracture 9.5% 1.3% Population: [place] (Caucasian) Risk Factors: None Based on Femur (Right) Neck BMD 1 -The 10-year probability of fracture may be lower than reported if the patient has received treatment. 2 -Major Osteoporotic Fracture: Clinical Spine, Forearm, Hip or Shoulder *FRAX is a Armed forces logistics/support/administrative officer of the [employer] for Metabolic Bone Disease, a World Science writer (WHO) [employer]. ASSESSMENT: The probability of a major osteoporotic fracture is 9.5% within the next ten years. The probability of a hip fracture is 1.3% within the next ten years. I have reviewed this report and agree with the above findings. Mark A. Tyron Russell, M.D. [facility] Electronically Signed   By: Ulyses Southward M.D.   On: 10/16/2017 13:54   Mm 3d Screen Breast Bilateral  Result Date: 10/16/2017 CLINICAL DATA:  Screening. EXAM: DIGITAL SCREENING BILATERAL MAMMOGRAM WITH TOMO AND CAD COMPARISON:  Previous exam(s). ACR Breast Density Category b: There are scattered areas of fibroglandular density. FINDINGS: There are no findings suspicious for malignancy. Images were processed with CAD. IMPRESSION: No mammographic evidence of malignancy. A result letter of this screening mammogram will be mailed directly to the patient. RECOMMENDATION: Screening mammogram in one year. (Code:SM-B-01Y) BI-RADS CATEGORY  1: Negative. Electronically Signed   By: Britta Mccreedy M.D.   On:  10/16/2017 15:55    Assessment & Plan:   Problem List Items Addressed This Visit    Anxiety state     secondary to husband's decline in health and the added stress of care of his sisters .   In the past she has been advised to avoid use of benzodiazepines and continue lexapro 20 mg daily , but she has been unable to sleep and is tearful  Today.  Adding low dose alprazolam for a limited tome to help her relax at night.  Increase cymbalta dose to 30 mg daily as well.       Relevant Medications   ALPRAZolam [name])  0.25 MG tablet   DULoxetine (CYMBALTA) 30 MG capsule    A total of 25 minutes of face to face time was spent with patient more than half of which was spent in counselling about the above mentioned conditions  and coordination of care   I have changed Erin Aguilar's DULoxetine. I am also having her start on ALPRAZolam. Additionally, I am having her maintain her multivitamin, calcium-vitamin D, Vitamin D3, UNABLE TO FIND, SYRINGE 3CC/25GX1", promethazine, cyanocobalamin, escitalopram, atorvastatin, pramipexole, esomeprazole, butalbital-acetaminophen-caffeine, and aspirin. We will continue to administer sodium chloride.  Meds ordered this encounter  Medications  . ALPRAZolam (XANAX) 0.25 MG tablet    Sig: Take 1 tablet (0.25 mg total) by mouth 2 (two) times daily as needed for anxiety.    Dispense:  30 tablet    Refill:  0  . DULoxetine (CYMBALTA) 30 MG capsule    Sig: Take 1 capsule (30 mg total) by mouth daily.    Dispense:  30 capsule    Refill:  1    Medications Discontinued During This Encounter  Medication Reason  . DULoxetine (CYMBALTA) 20 MG capsule     Follow-up: No follow-ups on file.   Sherlene Shams, MD

## 2017-10-31 NOTE — Telephone Encounter (Signed)
[facility]

## 2017-11-01 NOTE — Assessment & Plan Note (Addendum)
secondary to husband's decline in health and the added stress of care of his sisters .   In the past she has been advised to avoid use of benzodiazepines and continue lexapro 20 mg daily , but she has been unable to sleep and is tearful  Today.  Adding low dose alprazolam for a limited tome to help her relax at night.  Increase cymbalta dose to 30 mg daily as well.

## 2017-11-02 ENCOUNTER — Other Ambulatory Visit: Payer: Self-pay | Admitting: Internal Medicine

## 2017-12-03 ENCOUNTER — Other Ambulatory Visit: Payer: Self-pay | Admitting: Internal Medicine

## 2017-12-04 ENCOUNTER — Other Ambulatory Visit: Payer: Self-pay | Admitting: Internal Medicine

## 2017-12-10 ENCOUNTER — Ambulatory Visit (INDEPENDENT_AMBULATORY_CARE_PROVIDER_SITE_OTHER): Payer: Medicare Other

## 2017-12-10 VITALS — BP 104/70 | HR 76 | Temp 98.2°F | Resp 15 | Ht 63.0 in | Wt 182.8 lb

## 2017-12-10 DIAGNOSIS — Z0000 Encounter for general adult medical examination without abnormal findings: Secondary | ICD-10-CM

## 2017-12-10 NOTE — Progress Notes (Addendum)
Subjective:   Erin Aguilar is a 67 y.o. female who presents for Medicare Annual (Subsequent) preventive examination.  Review of Systems:  No ROS.  Medicare Wellness Visit. Additional risk factors are reflected in the social history. Cardiac Risk Factors include: advanced age (>72men, >37 women);hypertension;obesity (BMI >30kg/m2)     Objective:     Vitals: BP 104/70 (BP Location: Left Arm, Patient Position: Sitting, Cuff Size: Normal)   Pulse 76   Temp 98.2 F (36.8 C) (Oral)   Resp 15   Ht 5\' 3"  (1.6 m)   Wt 182 lb 12.8 oz (82.9 kg)   SpO2 95%   BMI 32.38 kg/m   Body mass index is 32.38 kg/m.  Advanced Directives 12/10/2017 09/12/2017 12/06/2016 11/21/2015  Does Patient Have a Medical Advance Directive? Yes Yes Yes No  Type of Advance Directive Living will;Healthcare Power of [employer] Power of [place];Living will Healthcare Power of [place];Living will -  Does patient want to make changes to medical advance directive? No - Patient declined - No - Patient declined -  Copy of Healthcare Power of Attorney in Chart? Yes Yes No - copy requested -    Tobacco Social History   Tobacco Use  Smoking Status Former Smoker  . Last attempt to quit: 12/08/1986  . Years since quitting: 31.0  Smokeless Tobacco Never Used     Counseling given: Not Answered   Clinical Intake:  Pre-visit preparation completed: Yes  Pain : No/denies pain     Nutritional Status: BMI > 30  Obese Diabetes: No  How often do you need to have someone help you when you read instructions, pamphlets, or other written materials from your doctor or pharmacy?: 1 - Never  Interpreter Needed?: No     Past Medical History:  Diagnosis Date  . Allergy    Hay fever  . Arthritis   . Chicken pox   . Colon polyp   . Diverticulitis   . GERD (gastroesophageal reflux disease)   . H/O: pertussis   . Headache(784.0)   . 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 Hemorrhoids, internal,  with bleeding    occasional  . Hyperlipidemia   . Migraines   . UTI (lower urinary tract infection)    Past Surgical History:  Procedure Laterality Date  . ABDOMINAL HYSTERECTOMY  1988  . CHOLECYSTECTOMY  1976  . GASTRIC BYPASS  03/2010  . HERNIA REPAIR  2008   ventral ,  from scar tissue, [place]  . JOINT REPLACEMENT Bilateral 02/26/2014   Dr. Howell Rucks   Family History  Problem Relation Age of Onset  . Arthritis Mother        rheumatoid   . Cancer Mother        lymphoma  . Early death Mother 62       lymphoma  . Arthritis Father   . Stroke Father   . Cancer Sister 17       cervical ca  . Kidney disease Brother   . Cancer Brother        lung ca,  smoker  . Arthritis Maternal Grandmother   . Heart disease Maternal Grandmother   . Diabetes Maternal Grandmother   . Arthritis Maternal Grandfather   . Heart disease Maternal Grandfather   . Diabetes Maternal Grandfather   . Breast cancer Neg Hx    Social History   Socioeconomic History  . Marital status: Married    Spouse name: Not on file  .  Number of children: 3  . Years of education: Not on file  . Highest education level: Not on file  Occupational History  . Occupation: retired  Engineer, productionocial Needs  . Financial resource strain: Not hard at all  . Food insecurity:    Worry: Never true    Inability: Never true  . Transportation needs:    Medical: No    Non-medical: No  Tobacco Use  . Smoking status: Former Smoker    Last attempt to quit: 12/08/1986    Years since quitting: 31.0  . Smokeless tobacco: Never Used  Substance and Sexual Activity  . Alcohol use: Yes    Alcohol/week: 3.0 standard drinks    Types: 3 Standard drinks or equivalent per week  . Drug use: No  . Sexual activity: Not on file  Lifestyle  . Physical activity:    Days per week: Not on file    Minutes per session: Not on file  . Stress: Only a little  Relationships  . Social connections:    Talks on phone: Not on file    Gets  together: Not on file    Attends religious service: Not on file    Active member of club or organization: Not on file    Attends meetings of clubs or organizations: Not on file    Relationship status: Not on file  Other Topics Concern  . Not on file  Social History Narrative  . Not on file    Outpatient Encounter Medications as of 12/10/2017  Medication Sig  . ALPRAZolam (XANAX) 0.25 MG tablet Take 1 tablet (0.25 mg total) by mouth 2 (two) times daily as needed for anxiety.  Marland Kitchen. aspirin 81 MG chewable tablet Chew 81 mg by mouth.  Marland Kitchen. atorvastatin (LIPITOR) 80 MG tablet TAKE 1 TABLET BY MOUTH ONCE DAILY.  . butalbital-acetaminophen-caffeine (FIORICET WITH CODEINE) 50-325-40-30 MG capsule TAKE 1 CAPSULE BY MOUTH EVERY 6 HOURS AS NEEDED  . calcium-vitamin D (OSCAL) 250-125 MG-UNIT per tablet Take 1 tablet by mouth daily.  . Cholecalciferol (VITAMIN D3) 2000 UNITS capsule Take 2,000 Units by mouth daily.   . cyanocobalamin (,VITAMIN B-12,) 1000 MCG/ML injection Inject 1 mL (1,000 mcg total) into the muscle every 30 (thirty) days. X 4, THEN MONTHLY  . DULoxetine (CYMBALTA) 30 MG capsule Take 1 capsule (30 mg total) by mouth daily.  Marland Kitchen. escitalopram (LEXAPRO) 20 MG tablet TAKE 1 TABLET BY MOUTH ONCE DAILY.  Marland Kitchen. esomeprazole (NEXIUM) 40 MG capsule TAKE 1 CAPSULE IN THE MORNING BEFORE BREAKFAST.  . Multiple Vitamin (MULTIVITAMIN) tablet Take 1 tablet by mouth daily.  . pramipexole (MIRAPEX) 0.25 MG tablet TAKE ONE TABLET BY MOUTH AT BEDTIME.  . promethazine (PHENERGAN) 25 MG tablet TAKE (1) TABLET BY MOUTH EVERY SIX HOURS AS NEEDED.  Marland Kitchen. Syringe/Needle, Disp, (SYRINGE 3CC/25GX1") 25G X 1" 3 ML MISC Use for b12 injections  . UNABLE TO FIND Hemp Cannabinoid extract 1/2 dropper sublingual once daily for arthritis pain.   Facility-Administered Encounter Medications as of 12/10/2017  Medication  . 0.9 %  sodium chloride infusion    Activities of Daily Living In your present state of health, do you have  any difficulty performing the following activities: 12/10/2017  Hearing? N  Vision? N  Difficulty concentrating or making decisions? N  Walking or climbing stairs? N  Dressing or bathing? N  Doing errands, shopping? N  Preparing Food and eating ? N  Using the Toilet? N  In the past six months, have you accidently leaked  urine? N  Do you have problems with loss of bowel control? N  Managing your Medications? N  Managing your Finances? N  Housekeeping or managing your Housekeeping? N  Some recent data might be hidden    Patient Care Team: Sherlene Shams, MD as PCP - General (Internal Medicine)    Assessment:   This is a routine wellness examination for Thetis. The goal of the wellness visit is to assist the patient how to close the gaps in care and create a preventative care plan for the patient.   The roster of all physicians providing medical care to patient is listed in the Snapshot section of the chart.  Taking calcium VIT D as appropriate/Osteoporosis reviewed.    Safety issues reviewed; Smoke and carbon monoxide detectors in the home. No firearms in the home. Wears seatbelts when driving or riding with others. No violence in the home.  They do not have excessive sun exposure.  Discussed the need for sun protection: hats, long sleeves and the use of sunscreen if there is significant sun exposure.  Patient is alert, normal appearance, oriented to person/place/and time.  Correctly identified the president of the [place] and recalls of 3/3 words. Performs simple calculations and can read correct time from watch face.  Displays appropriate judgement.  No new identified risk were noted.  No failures at ADL's or IADL's.   BMI- discussed the importance of a healthy diet, water intake and the benefits of aerobic exercise. Educational material provided.   24 hour diet recall: Regular diet  Dental- every 6 months. Last 10/2017. Partials.   Sleep patterns- Sleeps without issues,  taking medication as directed.   Influenza vaccine discussed. She plans to receive vaccine later in the season. Deferred.   Patient Concerns: None at this time. Follow up with PCP as needed.  Exercise Activities and Dietary recommendations Current Exercise Habits: Home exercise routine, Time (Minutes): 30, Frequency (Times/Week): 1, Weekly Exercise (Minutes/Week): 30, Intensity: Mild  Goals    . Increase physical activity     Water aerobics twice weekly, 30-45 minutes       Fall Risk Fall Risk  12/10/2017 12/06/2016 11/29/2015 07/27/2014 06/10/2012  Falls in the past year? No No No No No   Depression Screen PHQ 2/9 Scores 12/10/2017 04/19/2017 12/06/2016 11/29/2015  PHQ - 2 Score 0 0 0 0  PHQ- 9 Score - 0 0 -     Cognitive Function MMSE - Mini Mental State Exam 12/10/2017 12/06/2016  Orientation to time 5 5  Orientation to Place 5 5  Registration 3 3  Attention/ Calculation 5 5  Recall 3 3  Language- name 2 objects 2 -  Language- repeat 1 1  Language- follow 3 step command 3 -  Language- read & follow direction 1 1  Write a sentence 1 -  Copy design 1 1  Total score 30 -        Immunization History  Administered Date(s) Administered  . Hepatitis A, Adult 02/20/2013  . Hepatitis B 07/24/1994, 08/23/1994, 01/23/1995  . Influenza, High Dose Seasonal PF 01/12/2016, 12/06/2016  . Influenza,inj,Quad PF,6+ Mos 12/14/2014  . Influenza-Unspecified 01/21/2013  . Pneumococcal Conjugate-13 01/22/2013  . Pneumococcal Polysaccharide-23 07/27/2014  . Tdap 01/22/2013  . Zoster 01/23/2012  . Zoster Recombinat (Shingrix) 10/31/2017    Qualifies for Shingles Vaccine? Yes. 1st dose administered July 2019.   Screening Tests Health Maintenance  Topic Date Due  . INFLUENZA VACCINE  11/15/2017  . MAMMOGRAM  10/17/2019  .  TETANUS/TDAP  01/23/2023  . COLONOSCOPY  12/28/2025  . DEXA SCAN  Completed  . Hepatitis C Screening  Completed  . PNA vac Low Risk Adult  Completed      Plan:     End of life planning; Advance aging; Advanced directives discussed. Copy of current HCPOA/Living Will on file.    I have personally reviewed and noted the following in the patient's chart:   . Medical and social history . Use of alcohol, tobacco or illicit drugs  . Current medications and supplements . Functional ability and status . Nutritional status . Physical activity . Advanced directives . List of other physicians . Hospitalizations, surgeries, and ER visits in previous 12 months . Vitals . Screenings to include cognitive, depression, and falls . Referrals and appointments  In addition, I have reviewed and discussed with patient certain preventive protocols, quality metrics, and best practice recommendations. A written personalized care plan for preventive services as well as general preventive health recommendations were provided to patient.     OBrien-Blaney, Marita Burnsed L, LPN  12/14/5619   I have reviewed the above information and agree with above.   Duncan Dull, MD

## 2017-12-10 NOTE — Patient Instructions (Addendum)
  Ms. Erin Aguilar , Thank you for taking time to come for your Medicare Wellness Visit. I appreciate your ongoing commitment to your health goals. Please review the following plan we discussed and let me know if I can assist you in the future.   These are the goals we discussed: Goals    . Increase physical activity     Water aerobics twice weekly, 30-45 minutes       This is a list of the screening recommended for you and due dates:  Health Maintenance  Topic Date Due  . Flu Shot  11/15/2017  . Mammogram  10/17/2019  . Tetanus Vaccine  01/23/2023  . Colon Cancer Screening  12/28/2025  . DEXA scan (bone density measurement)  Completed  .  Hepatitis C: One time screening is recommended by Center for Disease Control  (CDC) for  adults born from [birth year] through 1965.   Completed  . Pneumonia vaccines  Completed

## 2017-12-27 ENCOUNTER — Other Ambulatory Visit: Payer: Self-pay | Admitting: Internal Medicine

## 2017-12-27 NOTE — Telephone Encounter (Signed)
Refilled: 10/30/2017 Last OV: 10/30/2017 Next OV: 03/18/2018

## 2017-12-27 NOTE — Telephone Encounter (Signed)
Alprazolam refilled and sent

## 2018-01-03 ENCOUNTER — Other Ambulatory Visit: Payer: Self-pay | Admitting: Internal Medicine

## 2018-01-10 ENCOUNTER — Other Ambulatory Visit: Payer: Self-pay | Admitting: Internal Medicine

## 2018-01-10 NOTE — Telephone Encounter (Signed)
Refilled: 06/20/2017 Last OV:  10/30/2017 Next OV: 03/18/2018

## 2018-01-10 NOTE — Telephone Encounter (Signed)
Pt's spouse calling to check on the status of this. States they requested earlier and never heard anything back.

## 2018-01-10 NOTE — Telephone Encounter (Signed)
Medication refilled

## 2018-03-08 ENCOUNTER — Other Ambulatory Visit: Payer: Self-pay | Admitting: Internal Medicine

## 2018-03-08 ENCOUNTER — Other Ambulatory Visit: Payer: Self-pay

## 2018-03-08 MED ORDER — DULOXETINE HCL 30 MG PO CPEP: 30.0000 mg | ORAL_CAPSULE | Freq: Every day | ORAL | 0 refills | Status: DC

## 2018-03-18 ENCOUNTER — Encounter: Payer: Self-pay | Admitting: Internal Medicine

## 2018-03-18 ENCOUNTER — Ambulatory Visit: Payer: Medicare Other | Admitting: Internal Medicine

## 2018-03-18 VITALS — BP 112/78 | HR 60 | Temp 98.1°F | Resp 14 | Ht 63.0 in | Wt 183.6 lb

## 2018-03-18 DIAGNOSIS — F411 Generalized anxiety disorder: Secondary | ICD-10-CM | POA: Diagnosis not present

## 2018-03-18 DIAGNOSIS — G894 Chronic pain syndrome: Secondary | ICD-10-CM

## 2018-03-18 DIAGNOSIS — I700 Atherosclerosis of aorta: Secondary | ICD-10-CM

## 2018-03-18 LAB — COMPREHENSIVE METABOLIC PANEL
ALBUMIN: 4.2 g/dL (ref 3.5–5.2)
ALT: 18 U/L (ref 0–35)
AST: 18 U/L (ref 0–37)
Alkaline Phosphatase: 83 U/L (ref 39–117)
BUN: 19 mg/dL (ref 6–23)
CO2: 26 mEq/L (ref 19–32)
CREATININE: 0.89 mg/dL (ref 0.40–1.20)
Calcium: 9.3 mg/dL (ref 8.4–10.5)
Chloride: 105 mEq/L (ref 96–112)
GFR: 66.7 mL/min (ref >60.00)
GLUCOSE: 108 mg/dL — AB (ref 70–99)
Potassium: 4.6 mEq/L (ref 3.5–5.1)
SODIUM: 139 meq/L (ref 135–145)
TOTAL PROTEIN: 7.3 g/dL (ref 6.0–8.3)
Total Bilirubin: 0.5 mg/dL (ref 0.2–1.2)

## 2018-03-18 LAB — LIPID PANEL
CHOLESTEROL: 115 mg/dL (ref 0–200)
HDL: 61.2 mg/dL (ref >39.00)
LDL CALC: 37 mg/dL (ref 0–99)
NONHDL: 53.82
Total CHOL/HDL Ratio: 2
Triglycerides: 84 mg/dL (ref 0.0–149.0)
VLDL: 16.8 mg/dL (ref 0.0–40.0)

## 2018-03-18 MED ORDER — TRAZODONE HCL 100 MG PO TABS: 100.0000 mg | ORAL_TABLET | Freq: Every evening | ORAL | 3 refills | Status: DC | PRN

## 2018-03-18 MED ORDER — ALPRAZOLAM 0.25 MG PO TABS: 0.2500 mg | ORAL_TABLET | Freq: Every evening | ORAL | 3 refills | Status: DC | PRN

## 2018-03-18 MED ORDER — BUTALBITAL-APAP-CAFF-COD 50-325-40-30 MG PO CAPS: ORAL_CAPSULE | ORAL | 5 refills | Status: DC

## 2018-03-18 NOTE — Progress Notes (Signed)
Subjective:  Patient ID: Erin Aguilar, female    DOB: 08-17-1950  Age: 67 y.o. MRN: [id number]  CC: The primary encounter diagnosis was Atherosclerosis of aorta (HCC). Diagnoses of Anxiety state and Chronic pain syndrome were also pertinent to this visit.  HPI Erin Aguilar presents for follow up on anxiety.  Last seen in July .  Emotional state aggravated by husband's ongoing medical issues  Not sleeping well despite using alprazolam  0.25 mg  Falls asleep and can't go back to sleep  cymbalta 30 mg helping for the daytime anxiety.  Denies panic attacks.   Outpatient Medications Prior to Visit  Medication Sig Dispense Refill  . aspirin 81 MG chewable tablet Chew 81 mg by mouth.    Marland Kitchen. atorvastatin (LIPITOR) 80 MG tablet TAKE 1 TABLET BY MOUTH ONCE DAILY. 90 tablet 0  . butalbital-acetaminophen-caffeine (FIORICET WITH CODEINE) 50-325-40-30 MG capsule TAKE 1 CAPSULE BY MOUTH EVERY 6 HOURS AS NEEDED 60 capsule 0  . calcium-vitamin D (OSCAL) 250-125 MG-UNIT per tablet Take 1 tablet by mouth daily.    . Cholecalciferol (VITAMIN D3) 2000 UNITS capsule Take 2,000 Units by mouth daily.     . cyanocobalamin (,VITAMIN B-12,) 1000 MCG/ML injection Inject 1 mL (1,000 mcg total) into the muscle every 30 (thirty) days. X 4, THEN MONTHLY 10 mL 03  . DULoxetine (CYMBALTA) 30 MG capsule Take 1 capsule (30 mg total) by mouth daily. 30 capsule 0  . escitalopram (LEXAPRO) 20 MG tablet TAKE 1 TABLET BY MOUTH ONCE DAILY. 90 tablet 0  . esomeprazole (NEXIUM) 40 MG capsule TAKE 1 CAPSULE IN THE MORNING BEFORE BREAKFAST. 90 capsule 0  . Multiple Vitamin (MULTIVITAMIN) tablet Take 1 tablet by mouth daily.    . pramipexole (MIRAPEX) 0.25 MG tablet TAKE ONE TABLET BY MOUTH AT BEDTIME. 90 tablet 0  . promethazine (PHENERGAN) 25 MG tablet TAKE (1) TABLET BY MOUTH EVERY SIX HOURS AS NEEDED. 30 tablet 3  . Syringe/Needle, Disp, (SYRINGE 3CC/25GX1") 25G X 1" 3 ML MISC Use for b12 injections 50 each 0  . UNABLE TO FIND  Hemp Cannabinoid extract 1/2 dropper sublingual once daily for arthritis pain.    Marland Kitchen. ALPRAZolam (XANAX) 0.25 MG tablet TAKE 1 TABLET TWICE DAILY AS NEEDED FOR ANXIETY 30 tablet 3  . butalbital-acetaminophen-caffeine (FIORICET WITH CODEINE) 50-325-40-30 MG capsule TAKE 1 CAPSULE BY MOUTH EVERY 6 HOURS AS NEEDED 60 capsule 2   Facility-Administered Medications Prior to Visit  Medication Dose Route Frequency Provider Last Rate Last Dose  . 0.9 %  sodium chloride infusion  500 mL Intravenous Continuous Sherrilyn Ristanis, Henry L III, MD        Review of Systems;  Patient denies headache, fevers, malaise, unintentional weight loss, skin rash, eye pain, sinus congestion and sinus pain, sore throat, dysphagia,  hemoptysis , cough, dyspnea, wheezing, chest pain, palpitations, orthopnea, edema, abdominal pain, nausea, melena, diarrhea, constipation, flank pain, dysuria, hematuria, urinary  Frequency, nocturia, numbness, tingling, seizures,  Focal weakness, Loss of consciousness,  Tremor, insomnia, depression, anxiety, and suicidal ideation.      Objective:  BP 112/78 (BP Location: Left Arm, Patient Position: Sitting, Cuff Size: Large)   Pulse 60   Temp 98.1 F (36.7 C) (Oral)   Resp 14   Ht 5\' 3"  (1.6 m)   Wt 183 lb 9.6 oz (83.3 kg)   SpO2 97%   BMI 32.52 kg/m   BP Readings from Last 3 Encounters:  03/18/18 112/78  12/10/17 104/70  10/30/17 110/66  Wt Readings from Last 3 Encounters:  03/18/18 183 lb 9.6 oz (83.3 kg)  12/10/17 182 lb 12.8 oz (82.9 kg)  10/30/17 188 lb (85.3 kg)    General appearance: alert, cooperative and appears stated age Ears: normal TM's and external ear canals both ears Throat: lips, mucosa, and tongue normal; teeth and gums normal Neck: no adenopathy, no carotid bruit, supple, symmetrical, trachea midline and thyroid not enlarged, symmetric, no tenderness/mass/nodules Back: symmetric, no curvature. ROM normal. No CVA tenderness. Lungs: clear to auscultation  bilaterally Heart: regular rate and rhythm, S1, S2 normal, no murmur, click, rub or gallop Abdomen: soft, non-tender; bowel sounds normal; no masses,  no organomegaly Pulses: 2+ and symmetric Skin: Skin color, texture, turgor normal. No rashes or lesions Lymph nodes: Cervical, supraclavicular, and axillary nodes normal.  Lab Results  Component Value Date   HGBA1C 5.7 12/14/2014   HGBA1C 5.8 07/23/2013    Lab Results  Component Value Date   CREATININE 0.89 03/18/2018   CREATININE 0.84 08/01/2017   CREATININE 0.79 04/19/2017    Lab Results  Component Value Date   WBC 6.5 08/01/2017   HGB 12.9 08/01/2017   HCT 38.1 08/01/2017   PLT 270.0 08/01/2017   GLUCOSE 108 (H) 03/18/2018   CHOL 115 03/18/2018   TRIG 84.0 03/18/2018   HDL 61.20 03/18/2018   LDLDIRECT 106.0 06/10/2012   LDLCALC 37 03/18/2018   ALT 18 03/18/2018   AST 18 03/18/2018   NA 139 03/18/2018   K 4.6 03/18/2018   CL 105 03/18/2018   CREATININE 0.89 03/18/2018   BUN 19 03/18/2018   CO2 26 03/18/2018   TSH 1.51 09/07/2016   HGBA1C 5.7 12/14/2014    Dg Bone Density  Result Date: 10/16/2017 EXAM: DUAL X-RAY ABSORPTIOMETRY (DXA) FOR BONE MINERAL DENSITY IMPRESSION: Dear Dr Duncan Dull, [name] completed a BMD test on 10/16/2017 using the [employer] Prodigy Advance DXA System (analysis version: 14.10) manufactured by [employer]. The following summarizes the results of our evaluation. PATIENT BIOGRAPHICAL: [name], Erin Aguilar Patient ID:  [id number] Birth Date: 1950/05/26 Height:     62.5 in. Weight:     185.8 lbs. Gender:      Female Exam Date:  10/16/2017 Indications: Advanced Age, bil knee replacements, Caucasian, Hysterectomy, Osteoarthritis, POSTmenopausal Fractures: Treatments: 81 MG ASPIRIN, multivitamin, Vitamin D ASSESSMENT: The BMD measured at Forearm Radius 33% is 0.700 g/cm2 with a T-score of -2.0. This patient is considered OSTEOPENIC according to World Health Organization [facility])  criteria. L2 & L4 were excluded due to  degenerative changes. The scan quality is limited by exclusions to the L spine. Site Region Measured Measured WHO Young Adult BMD Date       Age      Classification T-score AP Spine L1-L3 (L2) 10/16/2017 69.2 Osteopenia -1.8 0.945 g/cm2 DualFemur Neck Left 10/16/2017 69.2 Osteopenia -1.6 0.818 g/cm2 Left Forearm Radius 33% 10/16/2017 69.2 Osteopenia -2.0 0.700 g/cm2 World Health Organization [facility]) criteria for post-menopausal, Caucasian Women: Normal:       T-score at or above -1 SD Osteopenia:   T-score between -1 and -2.5 SD Osteoporosis: T-score at or below -2.5 SD RECOMMENDATIONS: 1. All patients should optimize calcium and vitamin D intake. 2. Consider FDA-approved medical therapies in postmenopausal women and men aged 82 years and older, based on the following: a. A hip or vertebral (clinical or morphometric) fracture b. T-score < -2.5 at the femoral neck or spine after appropriate evaluation to exclude secondary causes c. Low bone mass (  T-score between -1.0 and -2.5 at the femoral neck or spine) and a 10-year probability of a hip fracture > 3% or a 10-year probability of a major osteoporosis-related fracture > 20% based on the [place]-adapted WHO algorithm d. Clinician judgment and/or patient preferences may indicate treatment for people with 10-year fracture probabilities above or below these levels FOLLOW-UP: Patients with diagnosed cases of osteoporosis or at high risk for fracture should have regular bone mineral density tests. For patients eligible for Medicare, routine testing is allowed once every 2 years. The testing frequency can be increased to one year for patients who have rapidly progressing disease, those who are receiving or discontinuing medical therapy to restore bone mass, or have additional risk factors. I have reviewed this report, and agree with the above findings. Mark A. Tyron Russell, M.D. [facility] Radiology Dear Dr Duncan Dull, [name] completed a FRAX assessment on 10/16/2017 using the [facility] Prodigy Advance DXA System (analysis version: 14.10) manufactured by [employer]. The following summarizes the results of our evaluation. PATIENT BIOGRAPHICAL: [name], Erin Aguilar Patient ID: [id number] Birth Date: 28-Nov-1950 Height:    62.5 in. Gender:     [age]    [age]       Weight:    185.8 lbs. Ethnicity:  White                            Exam Date: 10/16/2017 FRAX* RESULTS:  (version: 3.5) 10-year Probability of Fracture1 Major Osteoporotic Fracture2 Hip Fracture 9.5% 1.3% Population: [place] (Caucasian) Risk Factors: None Based on Femur (Right) Neck BMD 1 -The 10-year probability of fracture may be lower than reported if the patient has received treatment. 2 -Major Osteoporotic Fracture: Clinical Spine, Forearm, Hip or Shoulder *FRAX is a Armed forces logistics/support/administrative officer of the [employer] for Metabolic Bone Disease, a World Science writer (WHO) [employer]. ASSESSMENT: The probability of a major osteoporotic fracture is 9.5% within the next ten years. The probability of a hip fracture is 1.3% within the next ten years. I have reviewed this report and agree with the above findings. Mark A. Tyron Russell, M.D. [facility] Radiology Electronically Signed   By: Ulyses Southward M.D.   On: 10/16/2017 13:54   Mm 3d Screen Breast Bilateral  Result Date: 10/16/2017 CLINICAL DATA:  Screening. EXAM: DIGITAL SCREENING BILATERAL MAMMOGRAM WITH TOMO AND CAD COMPARISON:  Previous exam(s). ACR Breast Density Category b: There are scattered areas of fibroglandular density. FINDINGS: There are no findings suspicious for malignancy. Images were processed with CAD. IMPRESSION: No mammographic evidence of malignancy. A result letter of this screening mammogram will be mailed directly to the patient. RECOMMENDATION: Screening mammogram in one year. (Code:SM-B-01Y) BI-RADS CATEGORY  1: Negative. Electronically Signed   By: Britta Mccreedy  M.D.   On: 10/16/2017 15:55    Assessment & Plan:   Problem List Items Addressed This Visit    Anxiety state    Her daytime anxiety has improved with higher Cymbalta dose.  Starting trazodone at 100 mg dose for management of insomnia , advised to limit use of alprazolam to early waking       Relevant Medications   traZODone (DESYREL) 100 MG tablet   ALPRAZolam (XANAX) 0.25 MG tablet   Chronic pain syndrome    Advised to avoid daily use of NSAIDs given history of bariatric surgery and recent decline  in GFR .  Tylenol  advised       Other Visit Diagnoses    Atherosclerosis of aorta (HCC)    -  Primary   Relevant Orders   Comprehensive metabolic panel (Completed)   Lipid panel (Completed)      I have changed Erin Aguilar's butalbital-acetaminophen-caffeine and ALPRAZolam. I am also having her start on traZODone. Additionally, I am having her maintain her multivitamin, calcium-vitamin D, Vitamin D3, UNABLE TO FIND, SYRINGE 3CC/25GX1", promethazine, cyanocobalamin, aspirin, escitalopram, butalbital-acetaminophen-caffeine, pramipexole, esomeprazole, atorvastatin, and DULoxetine. We will continue to administer sodium chloride.  Meds ordered this encounter  Medications  . traZODone (DESYREL) 100 MG tablet    Sig: Take 1 tablet (100 mg total) by mouth at bedtime as needed for sleep.    Dispense:  30 tablet    Refill:  3  . butalbital-acetaminophen-caffeine (FIORICET WITH CODEINE) 50-325-40-30 MG capsule    Sig: TAKE 1 CAPSULE BY MOUTH  TWO TIMES DAILY AS NEEDED FOR PAIN    Dispense:  60 capsule    Refill:  5  . ALPRAZolam (XANAX) 0.25 MG tablet    Sig: Take 1 tablet (0.25 mg total) by mouth at bedtime as needed for anxiety.    Dispense:  30 tablet    Refill:  3    Medications Discontinued During This Encounter  Medication Reason  . butalbital-acetaminophen-caffeine (FIORICET WITH CODEINE) 50-325-40-30 MG capsule Reorder  . ALPRAZolam (XANAX) 0.25 MG tablet Reorder     Follow-up: Return in about 6 months (around 09/17/2018).   Sherlene Shams, MD

## 2018-03-18 NOTE — Patient Instructions (Addendum)
I am recommending use of trazodone for insomnia. Take a full tablet 30 minutes prior to bedtime  And increase dse after 4 days if needed to 1.5 tablets   Save the alprazolam for early morning awakenings that do not resolve with meditation   You should be using tylenol on a daily basis for joint pain,,  Not advil.   You can add up to 1000 mg of acetominophen (tylenol) every day safely  In divided doses (500 mg every 12hours )

## 2018-03-19 NOTE — Assessment & Plan Note (Signed)
Her daytime anxiety has improved with higher Cymbalta dose.  Starting trazodone at 100 mg dose for management of insomnia , advised to limit use of alprazolam to early waking  

## 2018-03-19 NOTE — Assessment & Plan Note (Signed)
Advised to avoid daily use of NSAIDs given history of bariatric surgery and recent decline  in GFR .  Tylenol advised

## 2018-03-21 ENCOUNTER — Encounter: Payer: Self-pay | Admitting: *Deleted

## 2018-03-26 ENCOUNTER — Other Ambulatory Visit: Payer: Self-pay | Admitting: Internal Medicine

## 2018-04-30 ENCOUNTER — Other Ambulatory Visit: Payer: Self-pay | Admitting: Internal Medicine

## 2018-06-04 ENCOUNTER — Other Ambulatory Visit: Payer: Self-pay | Admitting: Internal Medicine

## 2018-06-04 DIAGNOSIS — R110 Nausea: Secondary | ICD-10-CM

## 2018-06-11 ENCOUNTER — Other Ambulatory Visit: Payer: Self-pay | Admitting: Internal Medicine

## 2018-06-27 ENCOUNTER — Other Ambulatory Visit: Payer: Self-pay | Admitting: Internal Medicine

## 2018-07-01 ENCOUNTER — Other Ambulatory Visit: Payer: Self-pay

## 2018-07-01 ENCOUNTER — Encounter: Payer: Self-pay | Admitting: Family Medicine

## 2018-07-01 ENCOUNTER — Ambulatory Visit: Payer: Medicare Other | Admitting: Family Medicine

## 2018-07-01 VITALS — BP 146/70 | HR 66 | Temp 98.1°F | Ht 63.0 in | Wt 189.6 lb

## 2018-07-01 DIAGNOSIS — R319 Hematuria, unspecified: Secondary | ICD-10-CM | POA: Diagnosis not present

## 2018-07-01 DIAGNOSIS — N390 Urinary tract infection, site not specified: Secondary | ICD-10-CM

## 2018-07-01 LAB — POCT URINALYSIS DIPSTICK
Bilirubin, UA: NEGATIVE
Blood, UA: POSITIVE
Glucose, UA: NEGATIVE
Ketones, UA: NEGATIVE
Nitrite, UA: NEGATIVE
Protein, UA: POSITIVE — AB
Spec Grav, UA: >=1.03 — AB (ref 1.010–1.025)
Urobilinogen, UA: 0.2 E.U./dL
pH, UA: 6 (ref 5.0–8.0)

## 2018-07-01 MED ORDER — CEFDINIR 300 MG PO CAPS: 300.0000 mg | ORAL_CAPSULE | Freq: Two times a day (BID) | ORAL | 0 refills | Status: DC

## 2018-07-01 NOTE — Patient Instructions (Signed)
Urinary Tract Infection, Adult A urinary tract infection (UTI) is an infection of any part of the urinary tract. The urinary tract includes:  The kidneys.  The ureters.  The bladder.  The urethra. These organs make, store, and get rid of pee (urine) in the body. What are the causes? This is caused by germs (bacteria) in your genital area. These germs grow and cause swelling (inflammation) of your urinary tract. What increases the risk? You are more likely to develop this condition if:  You have a small, thin tube (catheter) to drain pee.  You cannot control when you pee or poop (incontinence).  You are female, and: ? You use these methods to prevent pregnancy: ? A medicine that kills sperm (spermicide). ? A device that blocks sperm (diaphragm). ? You have low levels of a female hormone (estrogen). ? You are pregnant.  You have genes that add to your risk.  You are sexually active.  You take antibiotic medicines.  You have trouble peeing because of: ? A prostate that is bigger than normal, if you are female. ? A blockage in the part of your body that drains pee from the bladder (urethra). ? A kidney stone. ? A nerve condition that affects your bladder (neurogenic bladder). ? Not getting enough to drink. ? Not peeing often enough.  You have other conditions, such as: ? Diabetes. ? A weak disease-fighting system (immune system). ? Sickle cell disease. ? Gout. ? Injury of the spine. What are the signs or symptoms? Symptoms of this condition include:  Needing to pee right away (urgently).  Peeing often.  Peeing small amounts often.  Pain or burning when peeing.  Blood in the pee.  Pee that smells bad or not like normal.  Trouble peeing.  Pee that is cloudy.  Fluid coming from the vagina, if you are female.  Pain in the belly or lower back. Other symptoms include:  Throwing up (vomiting).  No urge to eat.  Feeling mixed up (confused).  Being tired  and grouchy (irritable).  A fever.  Watery poop (diarrhea). How is this treated? This condition may be treated with:  Antibiotic medicine.  Other medicines.  Drinking enough water. Follow these instructions at home:  Medicines  Take over-the-counter and prescription medicines only as told by your doctor.  If you were prescribed an antibiotic medicine, take it as told by your doctor. Do not stop taking it even if you start to feel better. General instructions  Make sure you: ? Pee until your bladder is empty. ? Do not hold pee for a long time. ? Empty your bladder after sex. ? Wipe from front to back after pooping if you are a female. Use each tissue one time when you wipe.  Drink enough fluid to keep your pee pale yellow.  Keep all follow-up visits as told by your doctor. This is important. Contact a doctor if:  You do not get better after 1-2 days.  Your symptoms go away and then come back. Get help right away if:  You have very bad back pain.  You have very bad pain in your lower belly.  You have a fever.  You are sick to your stomach (nauseous).  You are throwing up. Summary  A urinary tract infection (UTI) is an infection of any part of the urinary tract.  This condition is caused by germs in your genital area.  There are many risk factors for a UTI. These include having a small, thin   tube to drain pee and not being able to control when you pee or poop.  Treatment includes antibiotic medicines for germs.  Drink enough fluid to keep your pee pale yellow. This information is not intended to replace advice given to you by your health care provider. Make sure you discuss any questions you have with your health care provider. Document Released: 09/20/2007 Document Revised: 10/11/2017 Document Reviewed: 10/11/2017 Elsevier Interactive Patient Education  2019 Elsevier Inc.  

## 2018-07-01 NOTE — Progress Notes (Signed)
Subjective:    Patient ID: Erin Aguilar, female    DOB: [birth year], 68 y.o.   MRN: [id number]  HPI   Patient presents to clinic complaining of increased urinary frequency, pressure and burning for the past 2 days.  Denies fever chills.  Denies nausea, vomiting or diarrhea.  Patient Active Problem List   Diagnosis Date Noted   Hospital discharge follow-up 08/03/2017   TIA (transient ischemic attack) 03/21/2017   Chronic midline thoracic back pain 10/19/2016   Chronic left-sided low back pain 10/19/2016   B12 deficiency 09/09/2016   Chronic headache disorder 06/07/2016   Carpal tunnel syndrome 04/23/2015   Anxiety state 12/15/2014   Chronic pain syndrome 12/15/2014   Bladder cystocele 07/23/2013   S/P gastric bypass 07/23/2013   Osteoporosis 06/18/2012   Encounter for preventive health examination 06/10/2012   S/P bilateral unicompartmental knee replacement 12/09/2011   Obesity (BMI 30.0-34.9) 12/09/2011   Hemorrhoids, internal, with bleeding    Hemochromatosis carrier    H/O: pertussis    Social History   Tobacco Use   Smoking status: Former Smoker    Last attempt to quit: 12/08/1986    Years since quitting: 31.5   Smokeless tobacco: Never Used  Substance Use Topics   Alcohol use: Yes    Alcohol/week: 3.0 standard drinks    Types: 3 Standard drinks or equivalent per week   Review of Systems  Constitutional: Negative for chills, fatigue and fever.  HENT: Negative for congestion, ear pain, sinus pain and sore throat.   Eyes: Negative.   Respiratory: Negative for cough, shortness of breath and wheezing.   Cardiovascular: Negative for chest pain, palpitations and leg swelling.  Gastrointestinal: Negative for abdominal pain, diarrhea, nausea and vomiting.  Genitourinary: +dysuria, frequency and urgency.  Musculoskeletal: Negative for arthralgias and myalgias.  Skin: Negative for color change, pallor and rash.  Neurological: Negative for syncope,  light-headedness and headaches.  Psychiatric/Behavioral: The patient is not nervous/anxious.       Objective:   Physical Exam Vitals signs and nursing note reviewed.  Constitutional:      General: She is not in acute distress.    Appearance: She is well-developed. She is not toxic-appearing.  HENT:     Head: Normocephalic and atraumatic.  Eyes:     General: No scleral icterus.    Extraocular Movements: Extraocular movements intact.     Conjunctiva/sclera: Conjunctivae normal.  Neck:     Musculoskeletal: Neck supple.     Trachea: No tracheal deviation.  Cardiovascular:     Rate and Rhythm: Normal rate and regular rhythm.     Heart sounds: Normal heart sounds.  Pulmonary:     Effort: Pulmonary effort is normal. No respiratory distress.     Breath sounds: Normal breath sounds.  Abdominal:     General: Bowel sounds are normal. There is no distension.     Palpations: Abdomen is soft. There is no mass.     Tenderness: There is abdominal tenderness. There is no right CVA tenderness, left CVA tenderness, guarding or rebound.  Skin:    General: Skin is warm and dry.     Coloration: Skin is not jaundiced or pale.  Neurological:     Mental Status: She is alert and oriented to person, place, and time.     Gait: Gait normal.  Psychiatric:        Mood and Affect: Mood normal.        Behavior: Behavior normal.    Vitals:  07/01/18 1546  BP: (!) 146/70  Pulse: 66  Temp: 98.1 F (36.7 C)  SpO2: 97%      Assessment & Plan:   Urinary tract infection-urinalysis plus physical exam and patient symptoms are consistent with UTI.  We will send urine for culture for UTI confirmation.  She will take Omnicef twice daily for 5 days.  Advised to increase fluid intake, do good handwashing, wear cotton underwear, always wipe front to back.  Patient will be made aware of culture results when available.  Otherwise advised to keep regularly scheduled follow-up with PCP as planned.

## 2018-07-03 ENCOUNTER — Other Ambulatory Visit: Payer: Self-pay | Admitting: Internal Medicine

## 2018-07-03 LAB — URINE CULTURE
MICRO NUMBER:: 323537
SPECIMEN QUALITY:: ADEQUATE

## 2018-08-30 ENCOUNTER — Other Ambulatory Visit: Payer: Self-pay | Admitting: Internal Medicine

## 2018-09-02 NOTE — Telephone Encounter (Signed)
Refilled: 03/18/2018 Last OV: 03/18/2018 Next OV: 09/18/2018

## 2018-09-12 ENCOUNTER — Encounter: Payer: Self-pay | Admitting: Internal Medicine

## 2018-09-12 ENCOUNTER — Other Ambulatory Visit: Payer: Self-pay

## 2018-09-12 ENCOUNTER — Other Ambulatory Visit (INDEPENDENT_AMBULATORY_CARE_PROVIDER_SITE_OTHER): Payer: Medicare Other

## 2018-09-12 ENCOUNTER — Ambulatory Visit (INDEPENDENT_AMBULATORY_CARE_PROVIDER_SITE_OTHER): Payer: Medicare Other | Admitting: Internal Medicine

## 2018-09-12 DIAGNOSIS — R230 Cyanosis: Secondary | ICD-10-CM

## 2018-09-12 DIAGNOSIS — R300 Dysuria: Secondary | ICD-10-CM | POA: Diagnosis not present

## 2018-09-12 DIAGNOSIS — N390 Urinary tract infection, site not specified: Secondary | ICD-10-CM | POA: Diagnosis not present

## 2018-09-12 DIAGNOSIS — R319 Hematuria, unspecified: Secondary | ICD-10-CM

## 2018-09-12 DIAGNOSIS — R232 Flushing: Secondary | ICD-10-CM

## 2018-09-12 LAB — POCT URINALYSIS DIPSTICK
Bilirubin, UA: NEGATIVE
Glucose, UA: NEGATIVE
Ketones, UA: NEGATIVE
Nitrite, UA: NEGATIVE
Protein, UA: POSITIVE — AB
Spec Grav, UA: 1.015 (ref 1.010–1.025)
Urobilinogen, UA: 0.2 E.U./dL
pH, UA: 5.5 (ref 5.0–8.0)

## 2018-09-12 MED ORDER — CEFDINIR 300 MG PO CAPS: 300.0000 mg | ORAL_CAPSULE | Freq: Two times a day (BID) | ORAL | 0 refills | Status: DC

## 2018-09-12 NOTE — Assessment & Plan Note (Signed)
Suspect recurrent UTI.  Last treated for E COLI UTI in march 2020 with complete resolution of symptoms following omnicef 300 mg bid x 5.  Sh eis very uncomfortable. Empiric treatment with omnicef ; urine to be collected today PRIOR to medication start .  probiotic advised

## 2018-09-12 NOTE — Assessment & Plan Note (Signed)
Etiology unclear.   Has been taking cymbalta for YEARS,  NO RELIEF WITH LEXAPRO .  STILL HAS OVARIES.  Ordering CT abd and pelvis to rule out  LAD and ovarian CA

## 2018-09-12 NOTE — Progress Notes (Signed)
Telephone Note  This visit type was conducted due to national recommendations for restrictions regarding the COVID-19 pandemic (e.g. social distancing).  This format is felt to be most appropriate for this patient at this time.  All issues noted in this document were discussed and addressed.  No physical exam was performed (except for noted visual exam findings with Video Visits).   I connected with@ on 09/12/18 at  1:00 PM EDT by telephone and verified that I am speaking with the correct person using two identifiers. Location patient: home Location provider:  home office Persons participating in the virtual visit: patient, provider  I discussed the limitations, risks, security and privacy concerns of performing an evaluation and management service by telephone and the availability of in person appointments. I also discussed with the patient that there may be a patient responsible charge related to this service. The patient expressed understanding and agreed to proceed.   Reason for visit: bladder spasms, dysuria , chills   HPI: 68 yr old female  With history of cystocele treated surgically  in 2017 presents with    1) dysuria, bladder spasm.  Symptoms started  Last night .    Most recent UTI 07/01/18  E Coli,  treated with Omnicef 300 mg bid x 5 days to resolution . currently enrolled in a  Study at [facility] for follow up on bladder surgery and told that her bladder has remained in good position   2) 6 month history of hot flashes , occurring night and day.  Worse for the past several weeks , some abdominal distension    ROS: See pertinent positives and negatives per HPI.  Past Medical History:  Diagnosis Date  . Allergy    Hay fever  . Arthritis   . Chicken pox   . Colon polyp   . Diverticulitis   . GERD (gastroesophageal reflux disease)   . H/O: pertussis   . Headache(784.0)   . 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 Hemorrhoids, internal, with bleeding    occasional   . Hyperlipidemia   . Migraines   . UTI (lower urinary tract infection)     Past Surgical History:  Procedure Laterality Date  . ABDOMINAL HYSTERECTOMY  1988  . CHOLECYSTECTOMY  1976  . GASTRIC BYPASS  03/2010  . HERNIA REPAIR  2008   ventral ,  from scar tissue, [facility]  . JOINT REPLACEMENT Bilateral 02/26/2014   Dr. Howell Rucks    Family History  Problem Relation Age of Onset  . Arthritis Mother        rheumatoid   . Cancer Mother        lymphoma  . Early death Mother 17       lymphoma  . Arthritis Father   . Stroke Father   . Cancer Sister 79       cervical ca  . Kidney disease Brother   . Cancer Brother        lung ca,  smoker  . Arthritis Maternal Grandmother   . Heart disease Maternal Grandmother   . Diabetes Maternal Grandmother   . Arthritis Maternal Grandfather   . Heart disease Maternal Grandfather   . Diabetes Maternal Grandfather   . Breast cancer Neg Hx     SOCIAL HX:  reports that she quit smoking about 31 years ago. She has never used smokeless tobacco. She reports current alcohol use of about 3.0 standard drinks of alcohol per week. She reports that she does  not use drugs.   Current Outpatient Medications:  .  ALPRAZolam (XANAX) 0.25 MG tablet, TAKE 1 TABLET AT BEDTIME AS NEEDED FOR ANXIETY, Disp: 30 tablet, Rfl: 5 .  aspirin 81 MG chewable tablet, Chew 81 mg by mouth., Disp: , Rfl:  .  atorvastatin (LIPITOR) 80 MG tablet, TAKE 1 TABLET BY MOUTH ONCE DAILY., Disp: 90 tablet, Rfl: 0 .  butalbital-acetaminophen-caffeine (FIORICET WITH CODEINE) 50-325-40-30 MG capsule, TAKE 1 CAPSULE BY MOUTH EVERY 6 HOURS AS NEEDED, Disp: 60 capsule, Rfl: 0 .  butalbital-acetaminophen-caffeine (FIORICET WITH CODEINE) 50-325-40-30 MG capsule, TAKE 1 CAPSULE BY MOUTH  TWO TIMES DAILY AS NEEDED FOR PAIN, Disp: 60 capsule, Rfl: 5 .  calcium-vitamin D (OSCAL) 250-125 MG-UNIT per tablet, Take 1 tablet by mouth daily., Disp: , Rfl:  .  cefdinir (OMNICEF) 300 MG capsule,  Take 1 capsule (300 mg total) by mouth 2 (two) times daily., Disp: 10 capsule, Rfl: 0 .  Cholecalciferol (VITAMIN D3) 2000 UNITS capsule, Take 2,000 Units by mouth daily. , Disp: , Rfl:  .  cyanocobalamin (,VITAMIN B-12,) 1000 MCG/ML injection, INJECT 1ML I.M. ONCE A WEEK FOR 4 WEEKS; THEN ONCE MONTHLY AFTER THAT., Disp: 4 mL, Rfl: 0 .  DULoxetine (CYMBALTA) 30 MG capsule, TAKE (1) CAPSULE BY MOUTH ONCE DAILY., Disp: 30 capsule, Rfl: 5 .  escitalopram (LEXAPRO) 20 MG tablet, TAKE 1 TABLET BY MOUTH ONCE DAILY., Disp: 90 tablet, Rfl: 0 .  esomeprazole (NEXIUM) 40 MG capsule, TAKE 1 CAPSULE IN THE MORNING BEFORE BREAKFAST., Disp: 90 capsule, Rfl: 0 .  Multiple Vitamin (MULTIVITAMIN) tablet, Take 1 tablet by mouth daily., Disp: , Rfl:  .  pramipexole (MIRAPEX) 0.25 MG tablet, TAKE ONE TABLET BY MOUTH AT BEDTIME., Disp: 90 tablet, Rfl: 0 .  promethazine (PHENERGAN) 25 MG tablet, TAKE (1) TABLET BY MOUTH EVERY SIX HOURS AS NEEDED., Disp: 30 tablet, Rfl: 0 .  Syringe/Needle, Disp, (SYRINGE 3CC/25GX1") 25G X 1" 3 ML MISC, Use for b12 injections, Disp: 50 each, Rfl: 0 .  UNABLE TO FIND, Hemp Cannabinoid extract 1/2 dropper sublingual once daily for arthritis pain., Disp: , Rfl:   Current Facility-Administered Medications:  .  0.9 %  sodium chloride infusion, 500 mL, Intravenous, Continuous, Danis, Andreas BlowerHenry L III, MD  EXAM:   General impression: alert, cooperative and articulate.  No signs of being in distress  Lungs: speech is fluent sentence length suggests that patient is not short of breath and not punctuated by cough, sneezing or sniffing. Marland Kitchen.   Psych: affect normal.  speech is articulate and non pressured .  Denies suicidal thoughts  ASSESSMENT AND PLAN:  Discussed the following assessment and plan:  Dysuria - Plan: POCT Urinalysis Dipstick, Urine Microscopic Only, Urine Culture  Urinary tract infection with hematuria, site unspecified - Plan: cefdinir (OMNICEF) 300 MG capsule  Hot  flashes  Cyanosis - Plan: CT Abdomen Pelvis W Contrast  Dysuria Suspect recurrent UTI.  Last treated for E COLI UTI in march 2020 with complete resolution of symptoms following omnicef 300 mg bid x 5.  Sh eis very uncomfortable. Empiric treatment with omnicef ; urine to be collected today PRIOR to medication start .  probiotic advised   Hot flashes Etiology unclear.   Has been taking cymbalta for YEARS,  NO RELIEF WITH LEXAPRO .  STILL HAS OVARIES.  Ordering CT abd and pelvis to rule out  LAD and ovarian CA     I discussed the assessment and treatment plan with the patient. The patient was provided an  opportunity to ask questions and all were answered. The patient agreed with the plan and demonstrated an understanding of the instructions.   The patient was advised to call back or seek an in-person evaluation if the symptoms worsen or if the condition fails to improve as anticipated.  I provided 22  minutes of non-face-to-face time during this encounter.   Sherlene Shams, MD

## 2018-09-13 LAB — URINE CULTURE
MICRO NUMBER:: 514609
SPECIMEN QUALITY:: ADEQUATE

## 2018-09-13 LAB — URINALYSIS, MICROSCOPIC ONLY

## 2018-09-15 ENCOUNTER — Telehealth: Payer: Self-pay | Admitting: Internal Medicine

## 2018-09-15 NOTE — Telephone Encounter (Signed)
Message #2 (#1 was "no UTI").  I have ordered a CT of the abd and pelvis to look for other causes of her UTI symptoms and her new onset flushing /fhot flashes)

## 2018-09-16 NOTE — Telephone Encounter (Signed)
Pt is aware that CT scan has been ordered and is okay with that.

## 2018-09-18 ENCOUNTER — Other Ambulatory Visit: Payer: Self-pay

## 2018-09-18 ENCOUNTER — Encounter: Payer: Self-pay | Admitting: Internal Medicine

## 2018-09-18 ENCOUNTER — Ambulatory Visit (INDEPENDENT_AMBULATORY_CARE_PROVIDER_SITE_OTHER): Payer: Medicare Other | Admitting: Internal Medicine

## 2018-09-18 DIAGNOSIS — F329 Major depressive disorder, single episode, unspecified: Secondary | ICD-10-CM

## 2018-09-18 DIAGNOSIS — R300 Dysuria: Secondary | ICD-10-CM

## 2018-09-18 DIAGNOSIS — Z1239 Encounter for other screening for malignant neoplasm of breast: Secondary | ICD-10-CM

## 2018-09-18 DIAGNOSIS — F411 Generalized anxiety disorder: Secondary | ICD-10-CM

## 2018-09-18 DIAGNOSIS — R5383 Other fatigue: Secondary | ICD-10-CM

## 2018-09-18 DIAGNOSIS — E782 Mixed hyperlipidemia: Secondary | ICD-10-CM | POA: Diagnosis not present

## 2018-09-18 DIAGNOSIS — E538 Deficiency of other specified B group vitamins: Secondary | ICD-10-CM | POA: Diagnosis not present

## 2018-09-18 DIAGNOSIS — Z148 Genetic carrier of other disease: Secondary | ICD-10-CM | POA: Diagnosis not present

## 2018-09-18 DIAGNOSIS — G894 Chronic pain syndrome: Secondary | ICD-10-CM

## 2018-09-18 DIAGNOSIS — F32A Depression, unspecified: Secondary | ICD-10-CM

## 2018-09-18 DIAGNOSIS — R232 Flushing: Secondary | ICD-10-CM

## 2018-09-18 NOTE — Patient Instructions (Signed)
Your CT scan is scheduled for June 11. (I'm not sure what time: call the dept of radiology )  Increase your cymbalta to 60 mg and start taking it in the evening instead of morning.  Take 30 mg tonight,  Then 60 mg starting tomorrow night and none tomorrow morning   After a few days reduce your alprazolam to 1/2 tablet at bedtime for one week..  Then try going without it.  Lab appointment tomorr ow:  For fasting labs and repeat analysis  You can take the reports of last week's urinalysis to Dr Doy Hutching   Your annual mammogram will be ordered.  You are encouraged (required) to call to make your appointment at [facility]  [phone]

## 2018-09-18 NOTE — Progress Notes (Signed)
Virtual Visit via [email or web address]  This visit type was conducted due to national recommendations for restrictions regarding the COVID-19 pandemic (e.g. social distancing).  This format is felt to be most appropriate for this patient at this time.  All issues noted in this document were discussed and addressed.  No physical exam was performed (except for noted visual exam findings with Video Visits).   I connected with@ on 09/18/18 at  8:30 AM EDT by a video enabled telemedicine application or telephone and verified that I am speaking with the correct person using two identifiers. Location patient: home Location provider: work or home office Persons participating in the virtual visit: patient, provider  I discussed the limitations, risks, security and privacy concerns of performing an evaluation and management service by telephone and the availability of in person appointments. I also discussed with the patient that there may be a patient responsible charge related to this service. The patient expressed understanding and agreed to proceed.  Reason for visit: follow up on  multiple issues   HPI:  68 yr old female with  HISTORY OF CYSTOCELE REPAIR  Had a   recent evaluation for dysuria  On May 28,  Urine culture was negative  For one organism  But pyuria and scant hematuria was noted along with many bacteria.  She is participating in a study  Run by Dr. Doy Hutching at [facility] related to the type of cystocele repair she had done.  She has not had a urinalysis by them recently but receives pelvic exams regularly.   2) New onset hot flashes occurring day and night.  Still has ovaries.  History of gastric bypass surgery remotely, but no associated dumping syndrome   3)  Follow up on depression: has been taking Cymbalta since May 21.  Symptoms have been  aggravated by anxiety regarding husband's health .  Feels the medication is helping her symptoms of anxiety and depression .  She is tolerating the medication.  Continues to endorse fatigue.  Wonders if she needs dose adjustment or labs, given her history of B12 deficiency and bleeding hemorrhoids.   ROS: See pertinent positives and negatives per HPI.  Past Medical History:  Diagnosis Date  . Allergy    Hay fever  . Arthritis   . Chicken pox   . Colon polyp   . Diverticulitis   . GERD (gastroesophageal reflux disease)   . H/O: pertussis   . Headache(784.0)   . 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 Hemorrhoids, internal, with bleeding    occasional  . Hyperlipidemia   . Migraines   . UTI (lower urinary tract infection)     Past Surgical History:  Procedure Laterality Date  . ABDOMINAL HYSTERECTOMY  1988  . CHOLECYSTECTOMY  1976  . GASTRIC BYPASS  03/2010  . HERNIA REPAIR  2008   ventral ,  from scar tissue, [place]  . JOINT REPLACEMENT Bilateral 02/26/2014   Dr. Howell Rucks    Family History  Problem Relation Age of Onset  . Arthritis Mother        rheumatoid   . Cancer Mother        lymphoma  . Early death Mother 76       lymphoma  . Arthritis Father   . Stroke Father   . Cancer Sister 5       cervical ca  . Kidney disease Brother   . Cancer Brother        lung ca,  smoker  .  Arthritis Maternal Grandmother   . Heart disease Maternal Grandmother   . Diabetes Maternal Grandmother   . Arthritis Maternal Grandfather   . Heart disease Maternal Grandfather   . Diabetes Maternal Grandfather   . Breast cancer Neg Hx     SOCIAL HX:  Social History   Socioeconomic History  . Marital status: Married    Spouse name: Not on file  . Number of children: 3  . Years of education: Not on file  . Highest education level: Not on file  Occupational History  . Occupation: retired  Engineer, production  . Financial resource strain: Not hard at all  . Food insecurity:    Worry: Never true    Inability: Never true  . Transportation needs:    Medical: No    Non-medical: No  Tobacco Use  . Smoking status: Former  Smoker    Last attempt to quit: 12/08/1986    Years since quitting: 31.8  . Smokeless tobacco: Never Used  Substance and Sexual Activity  . Alcohol use: Yes    Alcohol/week: 3.0 standard drinks    Types: 3 Standard drinks or equivalent per week  . Drug use: No  . Sexual activity: Not on file  Lifestyle  . Physical activity:    Days per week: Not on file    Minutes per session: Not on file  . Stress: Only a little  Relationships  . Social connections:    Talks on phone: Not on file    Gets together: Not on file    Attends religious service: Not on file    Active member of club or organization: Not on file    Attends meetings of clubs or organizations: Not on file    Relationship status: Not on file  . Intimate partner violence:    Fear of current or ex partner: No    Emotionally abused: No    Physically abused: No    Forced sexual activity: No  Other Topics Concern  . Not on file  Social History Narrative  . Not on file      Current Outpatient Medications:  .  ALPRAZolam (XANAX) 0.25 MG tablet, TAKE 1 TABLET AT BEDTIME AS NEEDED FOR ANXIETY, Disp: 30 tablet, Rfl: 5 .  aspirin 81 MG chewable tablet, Chew 81 mg by mouth., Disp: , Rfl:  .  atorvastatin (LIPITOR) 80 MG tablet, TAKE 1 TABLET BY MOUTH ONCE DAILY., Disp: 90 tablet, Rfl: 0 .  butalbital-acetaminophen-caffeine (FIORICET WITH CODEINE) 50-325-40-30 MG capsule, TAKE 1 CAPSULE BY MOUTH EVERY 6 HOURS AS NEEDED, Disp: 60 capsule, Rfl: 0 .  butalbital-acetaminophen-caffeine (FIORICET WITH CODEINE) 50-325-40-30 MG capsule, TAKE 1 CAPSULE BY MOUTH  TWO TIMES DAILY AS NEEDED FOR PAIN, Disp: 60 capsule, Rfl: 5 .  calcium-vitamin D (OSCAL) 250-125 MG-UNIT per tablet, Take 1 tablet by mouth daily., Disp: , Rfl:  .  Cholecalciferol (VITAMIN D3) 2000 UNITS capsule, Take 2,000 Units by mouth daily. , Disp: , Rfl:  .  cyanocobalamin (,VITAMIN B-12,) 1000 MCG/ML injection, INJECT I.M. ONCE A WEEK FOR 4 WEEKS; THEN ONCE MONTHLY  AFTER THAT., Disp: 4 mL, Rfl: 0 .  DULoxetine (CYMBALTA) 30 MG capsule, TAKE (1) CAPSULE BY MOUTH ONCE DAILY., Disp: 30 capsule, Rfl: 5 .  escitalopram (LEXAPRO) 20 MG tablet, TAKE 1 TABLET BY MOUTH ONCE DAILY., Disp: 90 tablet, Rfl: 0 .  esomeprazole (NEXIUM) 40 MG capsule, TAKE 1 CAPSULE IN THE MORNING BEFORE BREAKFAST., Disp: 90 capsule, Rfl: 0 .  Multiple Vitamin (  MULTIVITAMIN) tablet, Take 1 tablet by mouth daily., Disp: , Rfl:  .  pramipexole (MIRAPEX) 0.25 MG tablet, TAKE ONE TABLET BY MOUTH AT BEDTIME., Disp: 90 tablet, Rfl: 0 .  promethazine (PHENERGAN) 25 MG tablet, TAKE (1) TABLET BY MOUTH EVERY SIX HOURS AS NEEDED., Disp: 30 tablet, Rfl: 0 .  Syringe/Needle, Disp, (SYRINGE 3CC/25GX1") 25G X 1" 3 ML MISC, Use for b12 injections, Disp: 50 each, Rfl: 0 .  UNABLE TO FIND, Hemp Cannabinoid extract 1/2 dropper sublingual once daily for arthritis pain., Disp: , Rfl:   Current Facility-Administered Medications:  .  0.9 %  sodium chloride infusion, 500 mL, Intravenous, Continuous, Danis, Andreas Blower, MD  EXAM:  VITALS per patient if applicable:  GENERAL: alert, oriented, appears well and in no acute distress  HEENT: atraumatic, conjunttiva clear, no obvious abnormalities on inspection of external nose and ears  NECK: normal movements of the head and neck  LUNGS: on inspection no signs of respiratory distress, breathing rate appears normal, no obvious gross SOB, gasping or wheezing  CV: no obvious cyanosis  MS: moves all visible extremities without noticeable abnormality  PSYCH/NEURO: pleasant and cooperative, no obvious depression or anxiety, speech and thought processing grossly intact  ASSESSMENT AND PLAN:  Discussed the following assessment and plan:  Breast cancer screening - Plan: MM 3D SCREEN BREAST BILATERAL  B12 deficiency - Plan: Vitamin B12  Hemochromatosis carrier - Plan: CBC with Differential/Platelet, IBC + Ferritin, CANCELED: Iron, TIBC and Ferritin  Panel  Dysuria - Plan: Urinalysis, Routine w reflex microscopic  Moderate mixed hyperlipidemia not requiring statin therapy - Plan: TSH, Lipid panel, Comprehensive metabolic panel  Chronic pain syndrome  Anxiety state  Hot flashes  Fatigue due to depression  Chronic pain syndrome Advised to avoid daily use of NSAIDs given history of bariatric surgery and recent decline  in GFR .  Tylenol advised.  cymbalta started in May for depression/anxiety with concurrent improvement in pain management   Anxiety state Her daytime anxiety has improved with higher Cymbalta dose.  Starting trazodone at 100 mg dose for management of insomnia , advised to limit use of alprazolam to early waking   Dysuria Symptoms have resolved.  Despite the lack of evidence of UTI by recent culture.  Her pyuria and bacteriuria have resolved by repeat testing   Hot flashes Etiology unclear.  Thyroid function is normal as is CBC except for macrocytosis.  Has been taking cymbalta for YEARS,  NO RELIEF WITH LEXAPRO .  STILL HAS OVARIES.  Ordering CT abd and pelvis to rule out  LAD and ovarian CA   B12 deficiency Secondary to bariatric surgery.  Continue  monthly IM injections.   Lab Results  Component Value Date   VITAMINB12 355 09/19/2018     Fatigue due to depression Screening labs normal.  No history of snoring.  Recommended participating in regular exercise program with goal of achieving a minimum of 30 minutes of aerobic activity 5 days per week.   Lab Results  Component Value Date   CREATININE 0.79 05/17/2015   Lab Results  Component Value Date   ALT 14 05/17/2015   AST 18 05/17/2015   ALKPHOS 69 05/17/2015   BILITOT 0.6 05/17/2015   Lab Results  Component Value Date   TSH 1.68 05/17/2015   Lab Results  Component Value Date   WBC 8.6 05/17/2015   HGB 14.7 05/17/2015   HCT 43.8 05/17/2015   MCV 92.7 05/17/2015   PLT 248.0 05/17/2015  I discussed the assessment and treatment plan with  the patient. The patient was provided an opportunity to ask questions and all were answered. The patient agreed with the plan and demonstrated an understanding of the instructions.   The patient was advised to call back or seek an in-person evaluation if the symptoms worsen or if the condition fails to improve as anticipated.  I provided 40 minutes of non-face-to-face time during this encounter.   Sherlene Shamseresa L Bonna Steury, MD

## 2018-09-19 ENCOUNTER — Other Ambulatory Visit: Payer: Self-pay

## 2018-09-19 ENCOUNTER — Other Ambulatory Visit (INDEPENDENT_AMBULATORY_CARE_PROVIDER_SITE_OTHER): Payer: Medicare Other

## 2018-09-19 DIAGNOSIS — E538 Deficiency of other specified B group vitamins: Secondary | ICD-10-CM | POA: Diagnosis not present

## 2018-09-19 DIAGNOSIS — E782 Mixed hyperlipidemia: Secondary | ICD-10-CM

## 2018-09-19 DIAGNOSIS — Z148 Genetic carrier of other disease: Secondary | ICD-10-CM | POA: Diagnosis not present

## 2018-09-19 DIAGNOSIS — R300 Dysuria: Secondary | ICD-10-CM | POA: Diagnosis not present

## 2018-09-19 LAB — CBC WITH DIFFERENTIAL/PLATELET
Basophils Absolute: 0 10*3/uL (ref 0.0–0.1)
Basophils Relative: 0.7 % (ref 0.0–3.0)
Eosinophils Absolute: 0.2 10*3/uL (ref 0.0–0.7)
Eosinophils Relative: 4.9 % (ref 0.0–5.0)
HCT: 37 % (ref 36.0–46.0)
Hemoglobin: 12.7 g/dL (ref 12.0–15.0)
Lymphocytes Relative: 40.2 % (ref 12.0–46.0)
Lymphs Abs: 2 10*3/uL (ref 0.7–4.0)
MCHC: 34.4 g/dL (ref 30.0–36.0)
MCV: 101.1 fl — ABNORMAL HIGH (ref 78.0–100.0)
Monocytes Absolute: 0.4 10*3/uL (ref 0.1–1.0)
Monocytes Relative: 7.8 % (ref 3.0–12.0)
Neutro Abs: 2.3 10*3/uL (ref 1.4–7.7)
Neutrophils Relative %: 46.4 % (ref 43.0–77.0)
Platelets: 204 10*3/uL (ref 150.0–400.0)
RBC: 3.66 Mil/uL — ABNORMAL LOW (ref 3.87–5.11)
RDW: 12.3 % (ref 11.5–15.5)
WBC: 4.9 10*3/uL (ref 4.0–10.5)

## 2018-09-19 LAB — LIPID PANEL
Cholesterol: 123 mg/dL (ref 0–200)
HDL: 64.2 mg/dL (ref >39.00)
LDL Cholesterol: 37 mg/dL (ref 0–99)
NonHDL: 59.24
Total CHOL/HDL Ratio: 2
Triglycerides: 112 mg/dL (ref 0.0–149.0)
VLDL: 22.4 mg/dL (ref 0.0–40.0)

## 2018-09-19 LAB — URINALYSIS, ROUTINE W REFLEX MICROSCOPIC
Bilirubin Urine: NEGATIVE
Hgb urine dipstick: NEGATIVE
Ketones, ur: NEGATIVE
Leukocytes,Ua: NEGATIVE
Nitrite: NEGATIVE
Specific Gravity, Urine: 1.025 (ref 1.000–1.030)
Total Protein, Urine: NEGATIVE
Urine Glucose: NEGATIVE
Urobilinogen, UA: 0.2 (ref 0.0–1.0)
pH: 6 (ref 5.0–8.0)

## 2018-09-19 LAB — COMPREHENSIVE METABOLIC PANEL
ALT: 23 U/L (ref 0–35)
AST: 25 U/L (ref 0–37)
Albumin: 4 g/dL (ref 3.5–5.2)
Alkaline Phosphatase: 75 U/L (ref 39–117)
BUN: 21 mg/dL (ref 6–23)
CO2: 25 mEq/L (ref 19–32)
Calcium: 8.9 mg/dL (ref 8.4–10.5)
Chloride: 106 mEq/L (ref 96–112)
Creatinine, Ser: 0.83 mg/dL (ref 0.40–1.20)
GFR: 67.92 mL/min (ref >60.00)
Glucose, Bld: 100 mg/dL — ABNORMAL HIGH (ref 70–99)
Potassium: 4.9 mEq/L (ref 3.5–5.1)
Sodium: 139 mEq/L (ref 135–145)
Total Bilirubin: 0.4 mg/dL (ref 0.2–1.2)
Total Protein: 6.8 g/dL (ref 6.0–8.3)

## 2018-09-19 LAB — IBC + FERRITIN
Ferritin: 15.6 ng/mL (ref 10.0–291.0)
Iron: 140 ug/dL (ref 42–145)
Saturation Ratios: 44.8 % (ref 20.0–50.0)
Transferrin: 223 mg/dL (ref 212.0–360.0)

## 2018-09-19 LAB — TSH: TSH: 3.07 u[IU]/mL (ref 0.35–4.50)

## 2018-09-19 LAB — VITAMIN B12: Vitamin B-12: 355 pg/mL (ref 211–911)

## 2018-09-21 DIAGNOSIS — F329 Major depressive disorder, single episode, unspecified: Secondary | ICD-10-CM | POA: Insufficient documentation

## 2018-09-21 DIAGNOSIS — F32A Depression, unspecified: Secondary | ICD-10-CM | POA: Insufficient documentation

## 2018-09-21 NOTE — Assessment & Plan Note (Addendum)
Etiology unclear.  Thyroid function is normal as is CBC except for macrocytosis.  Has been taking cymbalta for YEARS,  NO RELIEF WITH LEXAPRO .  STILL HAS OVARIES.  Ordering CT abd and pelvis to rule out  LAD and ovarian CA

## 2018-09-21 NOTE — Assessment & Plan Note (Signed)
Her daytime anxiety has improved with higher Cymbalta dose.  Starting trazodone at 100 mg dose for management of insomnia , advised to limit use of alprazolam to early waking

## 2018-09-21 NOTE — Assessment & Plan Note (Signed)
Secondary to bariatric surgery.  Continue  monthly IM injections.   Lab Results  Component Value Date   [id number] 09/19/2018

## 2018-09-21 NOTE — Assessment & Plan Note (Signed)
Advised to avoid daily use of NSAIDs given history of bariatric surgery and recent decline  in GFR .  Tylenol advised.  cymbalta started in May for depression/anxiety with concurrent improvement in pain management

## 2018-09-21 NOTE — Assessment & Plan Note (Signed)
Screening labs normal.  No history of snoring.  Recommended participating in regular exercise program with goal of achieving a minimum of 30 minutes of aerobic activity 5 days per week.   Lab Results  Component Value Date   CREATININE 0.79 05/17/2015   Lab Results  Component Value Date   ALT 14 05/17/2015   AST 18 05/17/2015   ALKPHOS 69 05/17/2015   BILITOT 0.6 05/17/2015   Lab Results  Component Value Date   TSH 1.68 05/17/2015   Lab Results  Component Value Date   WBC 8.6 05/17/2015   HGB 14.7 05/17/2015   HCT 43.8 05/17/2015   MCV 92.7 05/17/2015   PLT 248.0 05/17/2015    

## 2018-09-21 NOTE — Assessment & Plan Note (Addendum)
Symptoms have resolved.  Despite the lack of evidence of UTI by recent culture.  Her pyuria and bacteriuria have resolved by repeat testing

## 2018-09-25 ENCOUNTER — Telehealth: Payer: Self-pay

## 2018-09-25 MED ORDER — DULOXETINE HCL 60 MG PO CPEP: 60.0000 mg | ORAL_CAPSULE | Freq: Every day | ORAL | 5 refills | Status: DC

## 2018-09-25 NOTE — Telephone Encounter (Signed)
cymbalta 600 mg sent to pharmacy

## 2018-09-25 NOTE — Telephone Encounter (Signed)
Copied from [place] [id number]. Topic: General - Call Back - No Documentation >> Sep 25, 2018  8:54 AM Sheran Luz wrote: Reason for CRM: Patient returning call to Janett Billow- unable to reach office.

## 2018-09-25 NOTE — Telephone Encounter (Signed)
Copied from [place] [id number]. Topic: General - Call Back - No Documentation >> Sep 25, 2018  8:54 AM Sheran Luz wrote: Reason for CRM: Patient returning call to Janett Billow- unable to reach office. >> Sep 25, 2018  2:55 PM Percell Belt A wrote: Pt called in and stated that the Cymbalta  60mg  is working for her and would like called into her pharmacy

## 2018-09-25 NOTE — Addendum Note (Signed)
Addended by: Crecencio Mc on: 09/25/2018 04:46 PM   Modules accepted: Orders

## 2018-09-26 ENCOUNTER — Ambulatory Visit
Admission: RE | Admit: 2018-09-26 | Discharge: 2018-09-26 | Disposition: A | Discharge status: Home / Self Care | Payer: Medicare Other | Source: Ambulatory Visit | Attending: Internal Medicine | Admitting: Internal Medicine

## 2018-09-26 ENCOUNTER — Other Ambulatory Visit: Payer: Self-pay

## 2018-09-26 DIAGNOSIS — K5730 Diverticulosis of large intestine without perforation or abscess without bleeding: Secondary | ICD-10-CM | POA: Insufficient documentation

## 2018-09-26 DIAGNOSIS — R230 Cyanosis: Secondary | ICD-10-CM | POA: Diagnosis present

## 2018-09-26 DIAGNOSIS — K760 Fatty (change of) liver, not elsewhere classified: Secondary | ICD-10-CM | POA: Diagnosis not present

## 2018-09-26 MED ORDER — IOHEXOL 300 MG/ML  SOLN
100.0000 mL | Freq: Once | INTRAMUSCULAR | Status: AC | PRN
  Administered 2018-09-26: 100 mL via INTRAVENOUS

## 2018-09-26 NOTE — Telephone Encounter (Signed)
Patients family member aware. Pt was not up yet

## 2018-09-29 ENCOUNTER — Other Ambulatory Visit: Payer: Self-pay | Admitting: Internal Medicine

## 2018-09-29 DIAGNOSIS — N3090 Cystitis, unspecified without hematuria: Secondary | ICD-10-CM

## 2018-09-30 ENCOUNTER — Other Ambulatory Visit (INDEPENDENT_AMBULATORY_CARE_PROVIDER_SITE_OTHER): Payer: Medicare Other

## 2018-09-30 ENCOUNTER — Other Ambulatory Visit: Payer: Self-pay

## 2018-09-30 DIAGNOSIS — N3090 Cystitis, unspecified without hematuria: Secondary | ICD-10-CM

## 2018-09-30 LAB — URINALYSIS, ROUTINE W REFLEX MICROSCOPIC
Bilirubin Urine: NEGATIVE
Hgb urine dipstick: NEGATIVE
Ketones, ur: NEGATIVE
Nitrite: NEGATIVE
RBC / HPF: NONE SEEN (ref 0–?)
Specific Gravity, Urine: 1.02 (ref 1.000–1.030)
Total Protein, Urine: NEGATIVE
Urine Glucose: NEGATIVE
Urobilinogen, UA: 0.2 (ref 0.0–1.0)
pH: 6 (ref 5.0–8.0)

## 2018-10-02 LAB — URINE CULTURE
MICRO NUMBER:: 569716
SPECIMEN QUALITY:: ADEQUATE

## 2018-10-03 ENCOUNTER — Encounter: Payer: Self-pay | Admitting: Internal Medicine

## 2018-10-03 ENCOUNTER — Other Ambulatory Visit: Payer: Self-pay | Admitting: Internal Medicine

## 2018-10-03 ENCOUNTER — Telehealth: Payer: Self-pay | Admitting: Internal Medicine

## 2018-10-03 MED ORDER — CEFDINIR 300 MG PO CAPS: 300.0000 mg | ORAL_CAPSULE | Freq: Two times a day (BID) | ORAL | 0 refills | Status: DC

## 2018-10-03 NOTE — Telephone Encounter (Signed)
I did not call in cipro for her UTI because of her allergy to it. It is not sensitive to macrobid or septra .  I  Will call in cefdenir instead   Please take a probiotic ( Align, Floraque or Culturelle) of the generic version of one of these  For a minimum of 3 weeks to prevent a serious antibiotic associated diarrhea  Called clostridium dificile colitis

## 2018-10-04 NOTE — Telephone Encounter (Signed)
LMTCB. Please transfer pt to our office.  

## 2018-10-21 ENCOUNTER — Other Ambulatory Visit: Payer: Self-pay | Admitting: Internal Medicine

## 2018-10-21 NOTE — Telephone Encounter (Signed)
Refilled: 03/18/2018 Last OV: 09/18/2018 Next OV: 11/28/2018

## 2018-10-29 ENCOUNTER — Ambulatory Visit
Admission: RE | Admit: 2018-10-29 | Discharge: 2018-10-29 | Disposition: A | Discharge status: Home / Self Care | Payer: Medicare Other | Source: Ambulatory Visit | Attending: Internal Medicine | Admitting: Internal Medicine

## 2018-10-29 ENCOUNTER — Other Ambulatory Visit: Payer: Self-pay

## 2018-10-29 DIAGNOSIS — Z1239 Encounter for other screening for malignant neoplasm of breast: Secondary | ICD-10-CM

## 2018-10-29 DIAGNOSIS — Z1231 Encounter for screening mammogram for malignant neoplasm of breast: Secondary | ICD-10-CM | POA: Insufficient documentation

## 2018-11-25 ENCOUNTER — Other Ambulatory Visit: Payer: Medicare Other

## 2018-11-26 ENCOUNTER — Other Ambulatory Visit: Payer: Self-pay

## 2018-11-28 ENCOUNTER — Encounter: Payer: Self-pay | Admitting: Internal Medicine

## 2018-11-28 ENCOUNTER — Other Ambulatory Visit: Payer: Self-pay

## 2018-11-28 ENCOUNTER — Ambulatory Visit: Payer: Medicare Other | Admitting: Internal Medicine

## 2018-11-28 VITALS — BP 110/78 | HR 63 | Temp 97.8°F | Resp 15 | Ht 63.0 in | Wt 185.8 lb

## 2018-11-28 DIAGNOSIS — R110 Nausea: Secondary | ICD-10-CM | POA: Diagnosis not present

## 2018-11-28 DIAGNOSIS — I700 Atherosclerosis of aorta: Secondary | ICD-10-CM

## 2018-11-28 DIAGNOSIS — Z0000 Encounter for general adult medical examination without abnormal findings: Secondary | ICD-10-CM | POA: Diagnosis not present

## 2018-11-28 DIAGNOSIS — R7301 Impaired fasting glucose: Secondary | ICD-10-CM | POA: Diagnosis not present

## 2018-11-28 DIAGNOSIS — H5460 Unqualified visual loss, one eye, unspecified: Secondary | ICD-10-CM | POA: Diagnosis not present

## 2018-11-28 DIAGNOSIS — E669 Obesity, unspecified: Secondary | ICD-10-CM | POA: Diagnosis not present

## 2018-11-28 LAB — POCT GLYCOSYLATED HEMOGLOBIN (HGB A1C): Hemoglobin A1C: 5.8 % — AB (ref 4.0–5.6)

## 2018-11-28 MED ORDER — DULOXETINE HCL 60 MG PO CPEP: 60.0000 mg | ORAL_CAPSULE | Freq: Every day | ORAL | 1 refills | Status: DC

## 2018-11-28 MED ORDER — ALPRAZOLAM 0.25 MG PO TABS: ORAL_TABLET | ORAL | 5 refills | Status: DC

## 2018-11-28 MED ORDER — BUTALBITAL-APAP-CAFF-COD 50-325-40-30 MG PO CAPS: ORAL_CAPSULE | ORAL | 5 refills | Status: DC

## 2018-11-28 MED ORDER — PROMETHAZINE HCL 25 MG PO TABS: 25.0000 mg | ORAL_TABLET | Freq: Four times a day (QID) | ORAL | 0 refills | Status: DC | PRN

## 2018-11-28 NOTE — Patient Instructions (Signed)
I want you to reduce your portion sizes by 25% to avoid developing nausea  If you are still hungry after 5 minutes,  Go back for seconds  I want you to start walking daily for 15 minutes    Health Maintenance After Age 70 After age 55, you are at a higher risk for certain long-term diseases and infections as well as injuries from falls. Falls are a major cause of broken bones and head injuries in people who are older than age 25. Getting regular preventive care can help to keep you healthy and well. Preventive care includes getting regular testing and making lifestyle changes as recommended by your health care provider. Talk with your health care provider about:  Which screenings and tests you should have. A screening is a test that checks for a disease when you have no symptoms.  A diet and exercise plan that is right for you. What should I know about screenings and tests to prevent falls? Screening and testing are the best ways to find a health problem early. Early diagnosis and treatment give you the best chance of managing medical conditions that are common after age 84. Certain conditions and lifestyle choices may make you more likely to have a fall. Your health care provider may recommend:  Regular vision checks. Poor vision and conditions such as cataracts can make you more likely to have a fall. If you wear glasses, make sure to get your prescription updated if your vision changes.  Medicine review. Work with your health care provider to regularly review all of the medicines you are taking, including over-the-counter medicines. Ask your health care provider about any side effects that may make you more likely to have a fall. Tell your health care provider if any medicines that you take make you feel dizzy or sleepy.  Osteoporosis screening. Osteoporosis is a condition that causes the bones to get weaker. This can make the bones weak and cause them to break more easily.  Blood pressure  screening. Blood pressure changes and medicines to control blood pressure can make you feel dizzy.  Strength and balance checks. Your health care provider may recommend certain tests to check your strength and balance while standing, walking, or changing positions.  Foot health exam. Foot pain and numbness, as well as not wearing proper footwear, can make you more likely to have a fall.  Depression screening. You may be more likely to have a fall if you have a fear of falling, feel emotionally low, or feel unable to do activities that you used to do.  Alcohol use screening. Using too much alcohol can affect your balance and may make you more likely to have a fall. What actions can I take to lower my risk of falls? General instructions  Talk with your health care provider about your risks for falling. Tell your health care provider if: ? You fall. Be sure to tell your health care provider about all falls, even ones that seem minor. ? You feel dizzy, sleepy, or off-balance.  Take over-the-counter and prescription medicines only as told by your health care provider. These include any supplements.  Eat a healthy diet and maintain a healthy weight. A healthy diet includes low-fat dairy products, low-fat (lean) meats, and fiber from whole grains, beans, and lots of fruits and vegetables. Home safety  Remove any tripping hazards, such as rugs, cords, and clutter.  Install safety equipment such as grab bars in bathrooms and safety rails on stairs.  Keep  rooms and walkways well-lit. Activity   Follow a regular exercise program to stay fit. This will help you maintain your balance. Ask your health care provider what types of exercise are appropriate for you.  If you need a cane or walker, use it as recommended by your health care provider.  Wear supportive shoes that have nonskid soles. Lifestyle  Do not drink alcohol if your health care provider tells you not to drink.  If you drink  alcohol, limit how much you have: ? 0-1 drink a day for women. ? 0-2 drinks a day for men.  Be aware of how much alcohol is in your drink. In the [place] one drink equals one typical bottle of beer (12 oz), one-half glass of wine (5 oz), or one shot of hard liquor (1 oz).  Do not use any products that contain nicotine or tobacco, such as cigarettes and e-cigarettes. If you need help quitting, ask your health care provider. Summary  Having a healthy lifestyle and getting preventive care can help to protect your health and wellness after age 18.  Screening and testing are the best way to find a health problem early and help you avoid having a fall. Early diagnosis and treatment give you the best chance for managing medical conditions that are more common for people who are older than age 40.  Falls are a major cause of broken bones and head injuries in people who are older than age 25. Take precautions to prevent a fall at home.  Work with your health care provider to learn what changes you can make to improve your health and wellness and to prevent falls. This information is not intended to replace advice given to you by your health care provider. Make sure you discuss any questions you have with your health care provider. Document Released: 02/14/2017 Document Revised: 07/25/2018 Document Reviewed: 02/14/2017 Elsevier Patient Education  2020 [employer].

## 2018-11-28 NOTE — Progress Notes (Signed)
Patient ID: Erin Aguilar, female    DOB: [birth year]  Age: 68 y.o. MRN: [id number]  The patient is here for annual preventive  examination and management of other chronic and acute problems.   HM reviewed:  Normal mammogram October 30 2018: normal  Colonoscopy 2017 ; no polyps DEXA 2019: osteopenia  T score -2.0 forearm    The risk factors are reflected in the social history.  The roster of all physicians providing medical care to patient - is listed in the Snapshot section of the chart.  Activities of daily living:  The patient is 100% independent in all ADLs: dressing, toileting, feeding as well as independent mobility  Home safety : The patient has smoke detectors in the home. They wear seatbelts.  There are no firearms at home. There is no violence in the home.   There is no risks for hepatitis, STDs or HIV. There is no   history of blood transfusion. They have no travel history to infectious disease endemic areas of the world.  The patient has seen their dentist in the last six month. They have seen their eye doctor in the last year. They admit to slight hearing difficulty with regard to whispered voices and some television programs.  They have deferred audiologic testing in the last year.  They do not  have excessive sun exposure. Discussed the need for sun protection: hats, long sleeves and use of sunscreen if there is significant sun exposure.   Diet: the importance of a healthy diet is discussed. They do have a healthy diet.  The benefits of regular aerobic exercise were discussed. She walks 4 times per week ,  20 minutes.   Depression screen: there are no signs or vegative symptoms of depression- irritability, change in appetite, anhedonia, sadness/tearfullness.  Cognitive assessment: the patient manages all their financial and personal affairs and is actively engaged. They could relate day,date,year and events; recalled 2/3 objects at 3 minutes; performed clock-face test  normally.  The following portions of the patient's history were reviewed and updated as appropriate: allergies, current medications, past family history, past medical history,  past surgical history, past social history  and problem list.  Visual acuity was not assessed per patient preference since she has regular follow up with her ophthalmologist. Hearing and body mass index were assessed and reviewed.   During the course of the visit the patient was educated and counseled about appropriate screening and preventive services including : fall prevention , diabetes screening, nutrition counseling, colorectal cancer screening, and recommended immunizations.    CC: The primary encounter diagnosis was Encounter for preventive health examination. Diagnoses of Nausea, Impaired fasting glucose, Atherosclerosis of aorta (HCC), Morbid obesity (HCC), Monocular vision loss, and Obesity (BMI 30.0-34.9) were also pertinent to this visit.  1)  Monocular vision loss involving the right eye occurred recently.  She presented with painless blurred vision, was eventually ( one month) seen by [name] and treated with intravitreal injections (has had 3 of 3 thus far)  by [name].   The vision has improved significantly  20/50 now 20/25  By repeat testing   2) anxiety: she is  using alprazolam prn not daily  3) headaches,  Chronic pain due to cervical spine OA and DDD causing headaches.  Uses fioricet up to 2 daily  4) Recurrent nausea aggravated by meals and speed of eating and size of meal. .  Uses phenergan  Not exercising .  History of gastric bypass. Reviewed weight trend ;  BMI now over 30 again   5(  History Erin Aguilar has a past medical history of Allergy, Arthritis, Chicken pox, Colon polyp, Diverticulitis, GERD (gastroesophageal reflux disease), H/O: pertussis, Headache(784.0), Hemochromatosis carrier, Hemorrhoids, internal, with bleeding, Hyperlipidemia, Migraines, and UTI (lower urinary tract infection).    She has a past surgical history that includes Cholecystectomy (1976); Abdominal hysterectomy (1988); Gastric bypass (03/2010); Hernia repair (2008); and Joint replacement (Bilateral, 02/26/2014).   Her family history includes Arthritis in her father, maternal grandfather, maternal grandmother, and mother; Cancer in her brother and mother; Cancer (age of onset: 4342) in her sister; Diabetes in her maternal grandfather and maternal grandmother; Early death (age of onset: 8056) in her mother; Heart disease in her maternal grandfather and maternal grandmother; Kidney disease in her brother; Stroke in her father.She reports that she quit smoking about 32 years ago. She has never used smokeless tobacco. She reports current alcohol use of about 3.0 standard drinks of alcohol per week. She reports that she does not use drugs.  Outpatient Medications Prior to Visit  Medication Sig Dispense Refill  . aspirin 81 MG chewable tablet Chew 81 mg by mouth.    Marland Kitchen. atorvastatin (LIPITOR) 80 MG tablet TAKE 1 TABLET BY MOUTH ONCE DAILY. 90 tablet 1  . calcium-vitamin D (OSCAL) 250-125 MG-UNIT per tablet Take 1 tablet by mouth daily.    . Cholecalciferol (VITAMIN D3) 2000 UNITS capsule Take 2,000 Units by mouth daily.     Marland Kitchen. escitalopram (LEXAPRO) 20 MG tablet TAKE 1 TABLET BY MOUTH ONCE DAILY. 90 tablet 1  . esomeprazole (NEXIUM) 40 MG capsule TAKE 1 CAPSULE IN THE MORNING BEFORE BREAKFAST. 90 capsule 1  . Multiple Vitamin (MULTIVITAMIN) tablet Take 1 tablet by mouth daily.    . pramipexole (MIRAPEX) 0.25 MG tablet TAKE ONE TABLET BY MOUTH AT BEDTIME. 90 tablet 0  . UNABLE TO FIND Hemp Cannabinoid extract 1/2 dropper sublingual once daily for arthritis pain.    Marland Kitchen. ALPRAZolam (XANAX) 0.25 MG tablet TAKE 1 TABLET AT BEDTIME AS NEEDED FOR ANXIETY 30 tablet 5  . butalbital-acetaminophen-caffeine (FIORICET WITH CODEINE) 50-325-40-30 MG capsule TAKE 1 CAPSULE BY MOUTH TWICE DAILY AS NEEDED FOR PAIN 60 capsule 0  . DULoxetine  (CYMBALTA) 60 MG capsule Take 1 capsule (60 mg total) by mouth daily. 30 capsule 5  . promethazine (PHENERGAN) 25 MG tablet TAKE (1) TABLET BY MOUTH EVERY SIX HOURS AS NEEDED. 30 tablet 0  . butalbital-acetaminophen-caffeine (FIORICET WITH CODEINE) 50-325-40-30 MG capsule TAKE 1 CAPSULE BY MOUTH EVERY 6 HOURS AS NEEDED (Patient not taking: Reported on 11/28/2018) 60 capsule 0  . cefdinir (OMNICEF) 300 MG capsule Take 1 capsule (300 mg total) by mouth 2 (two) times daily. (Patient not taking: Reported on 11/28/2018) 10 capsule 0  . cyanocobalamin (,VITAMIN B-12,) 1000 MCG/ML injection INJECT 1ML I.M. ONCE A WEEK FOR 4 WEEKS; THEN ONCE MONTHLY AFTER THAT. (Patient not taking: Reported on 11/28/2018) 4 mL 0  . Syringe/Needle, Disp, (SYRINGE 3CC/25GX1") 25G X 1" 3 ML MISC Use for b12 injections (Patient not taking: Reported on 11/28/2018) 50 each 0   Facility-Administered Medications Prior to Visit  Medication Dose Route Frequency Provider Last Rate Last Dose  . 0.9 %  sodium chloride infusion  500 mL Intravenous Continuous Charlie Pitteranis, Henry L III, MD        Review of Systems   Patient denies headache, fevers, malaise, unintentional weight loss, skin rash, eye pain, sinus congestion and sinus pain, sore throat, dysphagia,  hemoptysis , cough, dyspnea,  wheezing, chest pain, palpitations, orthopnea, edema, abdominal pain, nausea, melena, diarrhea, constipation, flank pain, dysuria, hematuria, urinary  Frequency, nocturia, numbness, tingling, seizures,  Focal weakness, Loss of consciousness,  Tremor, insomnia, depression, anxiety, and suicidal ideation.      Objective:  BP 110/78 (BP Location: Left Arm, Patient Position: Sitting, Cuff Size: Large)   Pulse 63   Temp 97.8 F (36.6 C) (Oral)   Resp 15   Ht 5\' 3"  (1.6 m)   Wt 185 lb 12.8 oz (84.3 kg)   SpO2 98%   BMI 32.91 kg/m   Physical Exam   General appearance: alert, cooperative and appears stated age Ears: normal TM's and external ear canals both  ears Throat: lips, mucosa, and tongue normal; teeth and gums normal Neck: no adenopathy, no carotid bruit, supple, symmetrical, trachea midline and thyroid not enlarged, symmetric, no tenderness/mass/nodules Back: symmetric, no curvature. ROM normal. No CVA tenderness. Lungs: clear to auscultation bilaterally Heart: regular rate and rhythm, S1, S2 normal, no murmur, click, rub or gallop Abdomen: soft, non-tender; bowel sounds normal; no masses,  no organomegaly Pulses: 2+ and symmetric Skin: Skin color, texture, turgor normal. No rashes or lesions Lymph nodes: Cervical, supraclavicular, and axillary nodes normal.    Assessment & Plan:   Problem List Items Addressed This Visit      Unprioritized   Obesity (BMI 30.0-34.9)    She has regained 16 lbs since her gastric bypass in 2011 and is overeating to the point of nausea.  Recommend reducing her portion size by 25%       Encounter for preventive health examination - Primary    age appropriate education and counseling updated, referrals for preventative services and immunizations addressed, dietary and smoking counseling addressed, most recent labs reviewed.  I have personally reviewed and have noted:  1) the patient's medical and social history 2) The pt's use of alcohol, tobacco, and illicit drugs 3) The patient's current medications and supplements 4) Functional ability including ADL's, fall risk, home safety risk, hearing and visual impairment 5) Diet and physical activities 6) Evidence for depression or mood disorder 7) The patient's height, weight, and BMI have been recorded in the chart  I have made referrals, and provided counseling and education based on review of the above      Atherosclerosis of aorta [facility])    Incidental finding on June 2020 abdominal CT .  LDL is < 70  Lab Results  Component Value Date   CHOL 123 09/19/2018   HDL 64.20 09/19/2018   LDLCALC 37 09/19/2018   LDLDIRECT 106.0 06/10/2012   TRIG 112.0  09/19/2018   CHOLHDL 2 09/19/2018         Morbid obesity ([place])   Monocular vision loss    Per patient; awaiting records for Dr George Ina.  Workup depends on whetehr the retinal artyer of the retinal vein was occluded:  Arterial occlusion necessitates carotid copplers and ECHO with PFO /bubblet study an dis much more rare than retinal vein occlusion which necessitates screening for  hypercoag workup including  Factor V Leiden ,  homocysteine and anticardiolipin Ab        Other Visit Diagnoses    Nausea       Relevant Medications   promethazine (PHENERGAN) 25 MG tablet   Impaired fasting glucose       Relevant Orders   POCT HgB A1C (Completed)      I have discontinued Hassan Rowan A. Garside's SYRINGE 3CC/25GX1", cyanocobalamin, and cefdinir. I have also changed  her promethazine. Additionally, I am having her maintain her multivitamin, calcium-vitamin D, Vitamin D3, UNABLE TO FIND, aspirin, pramipexole, esomeprazole, escitalopram, atorvastatin, DULoxetine, ALPRAZolam, and butalbital-acetaminophen-caffeine. We will continue to administer sodium chloride.  Meds ordered this encounter  Medications  . DULoxetine (CYMBALTA) 60 MG capsule    Sig: Take 1 capsule (60 mg total) by mouth daily.    Dispense:  90 capsule    Refill:  1  . promethazine (PHENERGAN) 25 MG tablet    Sig: Take 1 tablet (25 mg total) by mouth every 6 (six) hours as needed for nausea or vomiting.    Dispense:  30 tablet    Refill:  0  . ALPRAZolam (XANAX) 0.25 MG tablet    Sig: TAKE 1 TABLET AT BEDTIME AS NEEDED FOR ANXIETY    Dispense:  30 tablet    Refill:  5  . butalbital-acetaminophen-caffeine (FIORICET WITH CODEINE) 50-325-40-30 MG capsule    Sig: TAKE 1 CAPSULE BY MOUTH TWICE DAILY AS NEEDED FOR PAIN    Dispense:  60 capsule    Refill:  5    Medications Discontinued During This Encounter  Medication Reason  . butalbital-acetaminophen-caffeine (FIORICET WITH CODEINE) 50-325-40-30 MG capsule Duplicate  .  cefdinir (OMNICEF) 300 MG capsule Completed Course  . cyanocobalamin (,VITAMIN B-12,) 1000 MCG/ML injection Patient has not taken in last 30 days  . Syringe/Needle, Disp, (SYRINGE 3CC/25GX1") 25G X 1" 3 ML MISC Patient has not taken in last 30 days  . promethazine (PHENERGAN) 25 MG tablet Reorder  . DULoxetine (CYMBALTA) 60 MG capsule Reorder  . ALPRAZolam (XANAX) 0.25 MG tablet Reorder  . butalbital-acetaminophen-caffeine (FIORICET WITH CODEINE) 50-325-40-30 MG capsule Reorder    Follow-up: No follow-ups on file.   Sherlene Shamseresa L Cutter Passey, MD

## 2018-11-30 DIAGNOSIS — I700 Atherosclerosis of aorta: Secondary | ICD-10-CM | POA: Insufficient documentation

## 2018-11-30 DIAGNOSIS — H5460 Unqualified visual loss, one eye, unspecified: Secondary | ICD-10-CM | POA: Insufficient documentation

## 2018-11-30 DIAGNOSIS — E669 Obesity, unspecified: Secondary | ICD-10-CM | POA: Insufficient documentation

## 2018-11-30 NOTE — Assessment & Plan Note (Addendum)
She has regained 16 lbs since her gastric bypass in 2011 and is overeating to the point of nausea.  Recommend reducing her portion size by 25%

## 2018-11-30 NOTE — Assessment & Plan Note (Addendum)
Per patient; awaiting records for Dr George Ina.  Workup depends on whetehr the retinal artyer of the retinal vein was occluded:  Arterial occlusion necessitates carotid copplers and ECHO with PFO /bubblet study an dis much more rare than retinal vein occlusion which necessitates screening for  hypercoag workup including  Factor V Leiden ,  homocysteine and anticardiolipin Ab

## 2018-11-30 NOTE — Assessment & Plan Note (Signed)

## 2018-11-30 NOTE — Assessment & Plan Note (Signed)
Incidental finding on June 2020 abdominal CT .  LDL is < 70  Lab Results  Component Value Date   CHOL 123 09/19/2018   HDL 64.20 09/19/2018   LDLCALC 37 09/19/2018   LDLDIRECT 106.0 06/10/2012   TRIG 112.0 09/19/2018   CHOLHDL 2 09/19/2018

## 2018-12-25 ENCOUNTER — Other Ambulatory Visit: Payer: Self-pay | Admitting: Internal Medicine

## 2018-12-25 ENCOUNTER — Other Ambulatory Visit: Payer: Self-pay

## 2018-12-25 ENCOUNTER — Ambulatory Visit (INDEPENDENT_AMBULATORY_CARE_PROVIDER_SITE_OTHER): Payer: Medicare Other

## 2018-12-25 DIAGNOSIS — Z0000 Encounter for general adult medical examination without abnormal findings: Secondary | ICD-10-CM

## 2018-12-25 NOTE — Progress Notes (Addendum)
Subjective:   Erin Aguilar is a 68 y.o. female who presents for Medicare Annual (Subsequent) preventive examination.  Review of Systems:  No ROS.  Medicare Wellness Virtual Visit.  Visual/audio telehealth visit, UTA vital signs.   See social history for additional risk factors.   Cardiac Risk Factors include: advanced age (>355men, 61>65 women)     Objective:     Vitals: There were no vitals taken for this visit.  There is no height or weight on file to calculate BMI.  Advanced Directives 12/25/2018 12/10/2017 09/12/2017 12/06/2016 11/21/2015  Does Patient Have a Medical Advance Directive? Yes Yes Yes Yes No  Type of Estate agentAdvance Directive Healthcare Power of [place];Living will Living will;Healthcare Power of State Street Corporationttorney Healthcare Power of [place]orney;Living will Healthcare Power of [place];Living will -  Does patient want to make changes to medical advance directive? No - Patient declined No - Patient declined - No - Patient declined -  Copy of Healthcare Power of Attorney in Chart? Yes - validated most recent copy scanned in chart (See row information) Yes Yes No - copy requested -    Tobacco Social History   Tobacco Use  Smoking Status Former Smoker  . Quit date: 12/08/1986  . Years since quitting: 32.0  Smokeless Tobacco Never Used     Counseling given: Not Answered   Clinical Intake:  Pre-visit preparation completed: Yes        Diabetes: No  How often do you need to have someone help you when you read instructions, pamphlets, or other written materials from your doctor or pharmacy?: 1 - Never  Interpreter Needed?: No     Past Medical History:  Diagnosis Date  . Allergy    Hay fever  . Arthritis   . Chicken pox   . Colon polyp   . Diverticulitis   . GERD (gastroesophageal reflux disease)   . H/O: pertussis   . Headache(784.0)   . 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 Hemorrhoids, internal, with bleeding    occasional  . Hyperlipidemia   .  Migraines   . UTI (lower urinary tract infection)    Past Surgical History:  Procedure Laterality Date  . ABDOMINAL HYSTERECTOMY  1988  . CHOLECYSTECTOMY  1976  . GASTRIC BYPASS  03/2010  . HERNIA REPAIR  2008   ventral ,  from scar tissue, [place]  . JOINT REPLACEMENT Bilateral 02/26/2014   Dr. Howell Rucksalph [place]   Family History  Problem Relation Age of Onset  . Arthritis Mother        rheumatoid   . Cancer Mother        lymphoma  . Early death Mother [id number]       lymphoma  . Arthritis Father   . Stroke Father   . Cancer Sister [id number]       cervical ca  . Kidney disease Brother   . Cancer Brother        lung ca,  smoker  . Arthritis Maternal Grandmother   . Heart disease Maternal Grandmother   . Diabetes Maternal Grandmother   . Arthritis Maternal Grandfather   . Heart disease Maternal Grandfather   . Diabetes Maternal Grandfather   . Breast cancer Neg Hx    Social History   Socioeconomic History  . Marital status: Married    Spouse name: Not on file  . Number of children: 3  . Years of education: Not on file  . Highest education level: Not on file  Occupational History  . Occupation: retired  Engineer, productionocial Needs  . Financial resource strain: Not hard at all  . Food insecurity    Worry: Never true    Inability: Never true  . Transportation needs    Medical: No    Non-medical: No  Tobacco Use  . Smoking status: Former Smoker    Quit date: 12/08/1986    Years since quitting: 32.0  . Smokeless tobacco: Never Used  Substance and Sexual Activity  . Alcohol use: Yes    Alcohol/week: 3.0 standard drinks    Types: 3 Standard drinks or equivalent per week  . Drug use: No  . Sexual activity: Not on file  Lifestyle  . Physical activity    Days per week: Not on file    Minutes per session: Not on file  . Stress: Only a little  Relationships  . Social Musicianconnections    Talks on phone: Not on file    Gets together: Not on file    Attends religious service: Not on file    Active  member of club or organization: Not on file    Attends meetings of clubs or organizations: Not on file    Relationship status: Not on file  Other Topics Concern  . Not on file  Social History Narrative  . Not on file    Outpatient Encounter Medications as of 12/25/2018  Medication Sig  . ALPRAZolam (XANAX) 0.25 MG tablet TAKE 1 TABLET AT BEDTIME AS NEEDED FOR ANXIETY  . aspirin 81 MG chewable tablet Chew 81 mg by mouth.  Marland Kitchen. atorvastatin (LIPITOR) 80 MG tablet TAKE 1 TABLET BY MOUTH ONCE DAILY.  . butalbital-acetaminophen-caffeine (FIORICET WITH CODEINE) 50-325-40-30 MG capsule TAKE 1 CAPSULE BY MOUTH TWICE DAILY AS NEEDED FOR PAIN  . calcium-vitamin D (OSCAL) 250-125 MG-UNIT per tablet Take 1 tablet by mouth daily.  . Cholecalciferol (VITAMIN D3) 2000 UNITS capsule Take 2,000 Units by mouth daily.   . DULoxetine (CYMBALTA) 60 MG capsule Take 1 capsule (60 mg total) by mouth daily.  Marland Kitchen. escitalopram (LEXAPRO) 20 MG tablet TAKE 1 TABLET BY MOUTH ONCE DAILY.  Marland Kitchen. esomeprazole (NEXIUM) 40 MG capsule TAKE 1 CAPSULE IN THE MORNING BEFORE BREAKFAST.  . Multiple Vitamin (MULTIVITAMIN) tablet Take 1 tablet by mouth daily.  . pramipexole (MIRAPEX) 0.25 MG tablet TAKE ONE TABLET BY MOUTH AT BEDTIME.  . promethazine (PHENERGAN) 25 MG tablet Take 1 tablet (25 mg total) by mouth every 6 (six) hours as needed for nausea or vomiting.  Marland Kitchen. UNABLE TO FIND Hemp Cannabinoid extract 1/2 dropper sublingual once daily for arthritis pain.   Facility-Administered Encounter Medications as of 12/25/2018  Medication  . 0.9 %  sodium chloride infusion    Activities of Daily Living In your present state of health, do you have any difficulty performing the following activities: 12/25/2018  Hearing? N  Vision? N  Difficulty concentrating or making decisions? N  Walking or climbing stairs? N  Dressing or bathing? N  Doing errands, shopping? N  Preparing Food and eating ? N  Using the Toilet? N  In the past six months,  have you accidently leaked urine? N  Do you have problems with loss of bowel control? N  Managing your Medications? N  Managing your Finances? N  Housekeeping or managing your Housekeeping? N  Some recent data might be hidden    Patient Care Team: Sherlene Shamsullo, Teresa L, MD as PCP - General (Internal Medicine)    Assessment:   This is a  routine wellness examination for Erin Aguilar.  I connected with patient 12/25/18 at 11:00 AM EDT by an audio enabled telemedicine application and verified that I am speaking with the correct person using two identifiers. Patient stated full name and DOB. Patient gave permission to continue with virtual visit. Patient's location was at home and Nurse's location was at [place] office.   Health Maintenance Due: Influenza vaccine 2020- discussed; to be completed in season with doctor or local pharmacy.   Update all pending maintenance due as appropriate.   See completed HM at the end of note.   Eye: Visual acuity not assessed. Virtual visit. Wears corrective lenses. Followed by their ophthalmologist.  Dental: Visits every 6 months.    Hearing: Demonstrates normal hearing during visit.  Safety:  Patient feels safe at home- yes Patient does have smoke detectors at home- yes Patient does wear sunscreen or protective clothing when in direct sunlight - yes Patient does wear seat belt when in a moving vehicle - yes Patient drives- yes Adequate lighting in walkways free from debris- yes Grab bars and handrails used as appropriate- yes Ambulates with no assistive device Cell phone on person when ambulating outside of the home- yes  Social: Alcohol intake - yes      Smoking history- former    Smokers in home? none Illicit drug use? none  Depression: PHQ 2 &9 complete. See screening below. Denies irritability, anhedonia, sadness/tearfullness.  Stable.   Falls: See screening below.    Medication: Taking as directed and without issues.   Covid-19:  Precautions and sickness symptoms discussed. Wears mask, social distancing, hand hygiene as appropriate.   Activities of Daily Living Patient denies needing assistance with: household chores, feeding themselves, getting from bed to chair, getting to the toilet, bathing/showering, dressing, managing money, or preparing meals.   Memory: Patient is alert. Patient denies difficulty focusing or concentrating. Correctly identified the president of the [place], season and recall. Patient likes to read for brain stimulation.  BMI- discussed the importance of a healthy diet, water intake and the benefits of aerobic exercise.  Educational material provided.  Physical activity- no routine. She plans to start walking.  Diet:  Regular Water: Admits she can do better. Caffeine: yes  Advanced Directive: End of life planning; Advance aging; Advanced directives discussed.  Copy of current HCPOA/Living Will on file.    Other Providers Patient Care Team: Sherlene Shams, MD as PCP - General (Internal Medicine)  Exercise Activities and Dietary recommendations Current Exercise Habits: Home exercise routine  Goals      Patient Stated   . DIET - INCREASE WATER INTAKE (pt-stated)      Other   . Increase physical activity     Walk for exercise 15 minutes daily       Fall Risk Fall Risk  12/25/2018 12/10/2017 12/06/2016 11/29/2015 07/27/2014  Falls in the past year? 0 No No No No   Timed Get Up and Go performed: no, virtual visit  Depression Screen PHQ 2/9 Scores 12/25/2018 12/10/2017 04/19/2017 12/06/2016  PHQ - 2 Score 0 0 0 0  PHQ- 9 Score - - 0 0     Cognitive Function MMSE - Mini Mental State Exam 12/10/2017 12/06/2016  Orientation to time 5 5  Orientation to Place 5 5  Registration 3 3  Attention/ Calculation 5 5  Recall 3 3  Language- name 2 objects 2 -  Language- repeat 1 1  Language- follow 3 step command 3 -  Language- read &  follow direction 1 1  Write a sentence 1 -  Copy design 1 1   Total score 30 -     6CIT Screen 12/25/2018  What Year? 0 points  What month? 0 points  What time? 0 points  Count back from 20 0 points  Months in reverse 0 points  Repeat phrase 0 points  Total Score 0    Immunization History  Administered Date(s) Administered  . Hepatitis A, Adult 02/20/2013  . Hepatitis B 07/24/1994, 08/23/1994, 01/23/1995  . Influenza, High Dose Seasonal PF 01/12/2016, 12/06/2016  . Influenza,inj,Quad PF,6+ Mos 12/14/2014  . Influenza,inj,quad, With Preservative 01/15/2018  . Influenza-Unspecified 01/21/2013  . Pneumococcal Conjugate-13 01/22/2013  . Pneumococcal Polysaccharide-23 07/27/2014  . Tdap 01/22/2013  . Zoster 01/23/2012  . Zoster Recombinat (Shingrix) 10/31/2017, 01/03/2018   Screening Tests Health Maintenance  Topic Date Due  . INFLUENZA VACCINE  07/16/2019 (Originally 11/16/2018)  . MAMMOGRAM  10/29/2019  . TETANUS/TDAP  01/23/2023  . COLONOSCOPY  12/28/2025  . DEXA SCAN  Completed  . Hepatitis C Screening  Completed  . PNA vac Low Risk Adult  Completed      Plan:   Keep all routine maintenance appointments.   Follow up 6 months 06/04/19  Medicare Attestation I have personally reviewed: The patient's medical and social history Their use of alcohol, tobacco or illicit drugs Their current medications and supplements The patient's functional ability including ADLs,fall risks, home safety risks, cognitive, and hearing and visual impairment Diet and physical activities Evidence for depression       OBrien-Blaney, Denisa L, LPN  04/24/5629   I have reviewed the above information and agree with above.   Deborra Medina, MD

## 2018-12-25 NOTE — Patient Instructions (Addendum)
  Erin Aguilar , Thank you for taking time to come for your Medicare Wellness Visit. I appreciate your ongoing commitment to your health goals. Please review the following plan we discussed and let me know if I can assist you in the future.   These are the goals we discussed: Goals      Patient Stated   . DIET - INCREASE WATER INTAKE (pt-stated)      Other   . Increase physical activity     Walk for exercise 15 minutes daily       This is a list of the screening recommended for you and due dates:  Health Maintenance  Topic Date Due  . Flu Shot  07/16/2019*  . Mammogram  10/29/2019  . Tetanus Vaccine  01/23/2023  . Colon Cancer Screening  12/28/2025  . DEXA scan (bone density measurement)  Completed  .  Hepatitis C: One time screening is recommended by Center for Disease Control  (CDC) for  adults born from [birth year] through 1967.   Completed  . Pneumonia vaccines  Completed  *Topic was postponed. The date shown is not the original due date.

## 2019-01-27 ENCOUNTER — Other Ambulatory Visit: Payer: Self-pay | Admitting: Internal Medicine

## 2019-01-28 NOTE — Telephone Encounter (Signed)
This was just refilled on 11/28/2018 with 5 refills.

## 2019-02-27 ENCOUNTER — Other Ambulatory Visit: Payer: Self-pay | Admitting: Internal Medicine

## 2019-03-31 ENCOUNTER — Other Ambulatory Visit: Payer: Self-pay | Admitting: Internal Medicine

## 2019-03-31 DIAGNOSIS — R110 Nausea: Secondary | ICD-10-CM

## 2019-04-24 DIAGNOSIS — H34831 Tributary (branch) retinal vein occlusion, right eye, with macular edema: Secondary | ICD-10-CM | POA: Diagnosis not present

## 2019-05-01 ENCOUNTER — Other Ambulatory Visit: Payer: Self-pay | Admitting: Internal Medicine

## 2019-06-02 ENCOUNTER — Other Ambulatory Visit: Payer: Self-pay | Admitting: Internal Medicine

## 2019-06-04 ENCOUNTER — Encounter: Payer: Self-pay | Admitting: Internal Medicine

## 2019-06-04 ENCOUNTER — Ambulatory Visit (INDEPENDENT_AMBULATORY_CARE_PROVIDER_SITE_OTHER): Payer: Medicare PPO | Admitting: Internal Medicine

## 2019-06-04 ENCOUNTER — Telehealth: Payer: Self-pay | Admitting: Internal Medicine

## 2019-06-04 DIAGNOSIS — E669 Obesity, unspecified: Secondary | ICD-10-CM | POA: Diagnosis not present

## 2019-06-04 DIAGNOSIS — F411 Generalized anxiety disorder: Secondary | ICD-10-CM | POA: Diagnosis not present

## 2019-06-04 MED ORDER — ALPRAZOLAM 0.25 MG PO TABS: ORAL_TABLET | ORAL | 5 refills | Status: DC

## 2019-06-04 MED ORDER — BUSPIRONE HCL 10 MG PO TABS: 10.0000 mg | ORAL_TABLET | Freq: Three times a day (TID) | ORAL | 1 refills | Status: DC

## 2019-06-04 NOTE — Assessment & Plan Note (Addendum)
Her daytime anxiety has improved with higher Cymbalta dose.  Starting trazodone at 100 mg dose for management of insomnia , advised to limit use of alprazolam to early waking . Continue lexaproAdding buspirone  10 mg bid/tid, avoiding increased use of alprazolam

## 2019-06-04 NOTE — Progress Notes (Signed)
Virtual Visit converted to Telephone  Note  This visit type was conducted due to national recommendations for restrictions regarding the COVID-19 pandemic (e.g. social distancing).  This format is felt to be most appropriate for this patient at this time.  All issues noted in this document were discussed and addressed.  No physical exam was performed (except for noted visual exam findings with Video Visits).   I attempted to connect with@ on 06/04/19 at  8:00 AM EST by a video enabled telemedicine application . Interactive audio and video telecommunications were attempted between this provider and patient, however failed, due to patient having technical difficulties OR patient did not have access to video capability.  We continued and completed visit with audio only.    Location patient: home Location provider: work or home office Persons participating in the virtual visit: patient, provider  I discussed the limitations, risks, security and privacy concerns of performing an evaluation and management service by telephone and the availability of in person appointments. I also discussed with the patient that there may be a patient responsible charge related to this service. The patient expressed understanding and agreed to proceed.  Reason for visit:   Follow up   HPI:  69 yr old female with GAD and insomnia, presents for management.  Did  Not tolerate  trazodone.  Using alprazolam prn   Getting more anxious during the day largely due to concern about husband Rosanne Ashing who is declining in health and mental capacity and has frequent panic attacks.  Obesity: she gained weight over the holidays  but started restricting her diet after Christmas and las lost 9 lbs.      ROS: See pertinent positives and negatives per HPI.  Past Medical History:  Diagnosis Date  . Allergy    Hay fever  . Arthritis   . Chicken pox   . Colon polyp   . Diverticulitis   . GERD (gastroesophageal reflux disease)   . H/O:  pertussis   . Headache(784.0)   . 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 Hemorrhoids, internal, with bleeding    occasional  . Hyperlipidemia   . Migraines   . UTI (lower urinary tract infection)     Past Surgical History:  Procedure Laterality Date  . ABDOMINAL HYSTERECTOMY  1988  . CHOLECYSTECTOMY  1976  . GASTRIC BYPASS  03/2010  . HERNIA REPAIR  2008   ventral ,  from scar tissue, [place]  . JOINT REPLACEMENT Bilateral 02/26/2014   Dr. Howell Rucks    Family History  Problem Relation Age of Onset  . Arthritis Mother        rheumatoid   . Cancer Mother        lymphoma  . Early death Mother 57       lymphoma  . Arthritis Father   . Stroke Father   . Cancer Sister 35       cervical ca  . Kidney disease Brother   . Cancer Brother        lung ca,  smoker  . Arthritis Maternal Grandmother   . Heart disease Maternal Grandmother   . Diabetes Maternal Grandmother   . Arthritis Maternal Grandfather   . Heart disease Maternal Grandfather   . Diabetes Maternal Grandfather   . Breast cancer Neg Hx     SOCIAL HX:  reports that she quit smoking about 32 years ago. She has never used smokeless tobacco. She reports current alcohol use of about 3.0  standard drinks of alcohol per week. She reports that she does not use drugs.   Current Outpatient Medications:  .  ALPRAZolam (XANAX) 0.25 MG tablet, TAKE 1 TABLET AT BEDTIME AS NEEDED FOR ANXIETY, Disp: 30 tablet, Rfl: 5 .  aspirin 81 MG chewable tablet, Chew 81 mg by mouth., Disp: , Rfl:  .  atorvastatin (LIPITOR) 80 MG tablet, TAKE 1 TABLET BY MOUTH ONCE DAILY., Disp: 90 tablet, Rfl: 3 .  butalbital-acetaminophen-caffeine (FIORICET WITH CODEINE) 50-325-40-30 MG capsule, TAKE 1 CAPSULE BY MOUTH TWICE DAILY AS NEEDED FOR PAIN, Disp: 60 capsule, Rfl: 2 .  DULoxetine (CYMBALTA) 60 MG capsule, TAKE (1) CAPSULE BY MOUTH ONCE DAILY., Disp: 90 capsule, Rfl: 0 .  escitalopram (LEXAPRO) 20 MG tablet, TAKE 1 TABLET BY MOUTH  ONCE DAILY., Disp: 90 tablet, Rfl: 3 .  esomeprazole (NEXIUM) 40 MG capsule, TAKE 1 CAPSULE IN THE MORNING BEFORE BREAKFAST., Disp: 90 capsule, Rfl: 3 .  Multiple Vitamin (MULTIVITAMIN) tablet, Take 1 tablet by mouth daily., Disp: , Rfl:  .  pramipexole (MIRAPEX) 0.25 MG tablet, TAKE ONE TABLET BY MOUTH AT BEDTIME., Disp: 90 tablet, Rfl: 0 .  PREMARIN vaginal cream, Place 1 Applicatorful vaginally. Twice weekly, Disp: , Rfl:  .  promethazine (PHENERGAN) 25 MG tablet, TAKE 1 TABLET BY MOUTH EVERY 6 HOURS AS NEEDED FOR NAUSEA & VOMITING, Disp: 30 tablet, Rfl: 0 .  trimethoprim (TRIMPEX) 100 MG tablet, Take 100 mg by mouth daily. As needed, Disp: , Rfl:  .  UNABLE TO FIND, Hemp Cannabinoid extract 1/2 dropper sublingual once daily for arthritis pain., Disp: , Rfl:  .  busPIRone (BUSPAR) 10 MG tablet, Take 1 tablet (10 mg total) by mouth 3 (three) times daily., Disp: 90 tablet, Rfl: 1  Current Facility-Administered Medications:  .  0.9 %  sodium chloride infusion, 500 mL, Intravenous, Continuous, Danis, Kirke Corin, MD  EXAM:   General impression: alert, cooperative and articulate.  No signs of being in distress  Lungs: speech is fluent sentence length suggests that patient is not short of breath and not punctuated by cough, sneezing or sniffing. Marland Kitchen   Psych: affect normal.  speech is articulate and non pressured .  pleasant and cooperative, no obvious depression or anxiety, speech and thought processing grossly intact  ASSESSMENT AND PLAN:  Discussed the following assessment and plan:  Anxiety state  Obesity (BMI 30.0-34.9)  Anxiety state Her daytime anxiety has improved with higher Cymbalta dose.  Starting trazodone at 100 mg dose for management of insomnia , advised to limit use of alprazolam to early waking . Continue lexaproAdding buspirone  10 mg bid/tid, avoiding increased use of alprazolam    Obesity (BMI 30.0-34.9) She has lost 9 lbs since Christmas  Through diet.     I  discussed the assessment and treatment plan with the patient. The patient was provided an opportunity to ask questions and all were answered. The patient agreed with the plan and demonstrated an understanding of the instructions.   The patient was advised to call back or seek an in-person evaluation if the symptoms worsen or if the condition fails to improve as anticipated.   I provided  25 minutes of non-face-to-face time during this encounter reviewing patient's current problems and post surgeries.  Providing counseling on the above mentioned problems , and coordination  of care . Crecencio Mc, MD

## 2019-06-04 NOTE — Assessment & Plan Note (Signed)
She has lost 9 lbs since Christmas  Through diet.

## 2019-06-04 NOTE — Telephone Encounter (Signed)
Lm with husband for patient to call back and schedule a 4w follow up for medication change.

## 2019-06-04 NOTE — Patient Instructions (Signed)
Adding buspirone 10 mg bid for anxiety  You can add a 3rd dose in the evening if needed

## 2019-07-17 DIAGNOSIS — H34831 Tributary (branch) retinal vein occlusion, right eye, with macular edema: Secondary | ICD-10-CM | POA: Diagnosis not present

## 2019-07-29 ENCOUNTER — Telehealth: Payer: Self-pay | Admitting: Internal Medicine

## 2019-07-29 MED ORDER — BUTALBITAL-APAP-CAFF-COD 50-325-40-30 MG PO CAPS: ORAL_CAPSULE | ORAL | 2 refills | Status: DC

## 2019-07-29 NOTE — Telephone Encounter (Signed)
Ok to refill,  Refill sent  

## 2019-07-29 NOTE — Telephone Encounter (Signed)
Pt needs a refill on butalbital-acetaminophen-caffeine (FIORICET WITH CODEINE) 50-325-40-30 MG capsule sent to [facility]

## 2019-07-29 NOTE — Telephone Encounter (Signed)
refill request for butalbital-acetaminophen-caffeine, last seen 06-04-19, last filled 01-28-19.  Please advise.

## 2019-07-29 NOTE — Addendum Note (Signed)
Addended by: Sherlene Shams on: 07/29/2019 05:39 PM   Modules accepted: Orders

## 2019-08-28 DIAGNOSIS — H34831 Tributary (branch) retinal vein occlusion, right eye, with macular edema: Secondary | ICD-10-CM | POA: Diagnosis not present

## 2019-09-08 ENCOUNTER — Other Ambulatory Visit: Payer: Self-pay | Admitting: Internal Medicine

## 2019-09-25 DIAGNOSIS — N390 Urinary tract infection, site not specified: Secondary | ICD-10-CM | POA: Diagnosis not present

## 2019-10-22 ENCOUNTER — Other Ambulatory Visit: Payer: Self-pay

## 2019-10-22 ENCOUNTER — Ambulatory Visit: Payer: Medicare PPO | Admitting: Internal Medicine

## 2019-10-22 ENCOUNTER — Encounter: Payer: Self-pay | Admitting: Internal Medicine

## 2019-10-22 VITALS — BP 118/76 | HR 63 | Temp 97.6°F | Resp 14 | Ht 63.0 in | Wt 186.2 lb

## 2019-10-22 DIAGNOSIS — Z1231 Encounter for screening mammogram for malignant neoplasm of breast: Secondary | ICD-10-CM

## 2019-10-22 DIAGNOSIS — R631 Polydipsia: Secondary | ICD-10-CM | POA: Diagnosis not present

## 2019-10-22 DIAGNOSIS — E785 Hyperlipidemia, unspecified: Secondary | ICD-10-CM | POA: Diagnosis not present

## 2019-10-22 DIAGNOSIS — F411 Generalized anxiety disorder: Secondary | ICD-10-CM | POA: Diagnosis not present

## 2019-10-22 DIAGNOSIS — F329 Major depressive disorder, single episode, unspecified: Secondary | ICD-10-CM

## 2019-10-22 DIAGNOSIS — E669 Obesity, unspecified: Secondary | ICD-10-CM

## 2019-10-22 DIAGNOSIS — R110 Nausea: Secondary | ICD-10-CM | POA: Diagnosis not present

## 2019-10-22 DIAGNOSIS — E538 Deficiency of other specified B group vitamins: Secondary | ICD-10-CM

## 2019-10-22 DIAGNOSIS — E1169 Type 2 diabetes mellitus with other specified complication: Secondary | ICD-10-CM

## 2019-10-22 DIAGNOSIS — R5383 Other fatigue: Secondary | ICD-10-CM

## 2019-10-22 DIAGNOSIS — R7303 Prediabetes: Secondary | ICD-10-CM

## 2019-10-22 LAB — LIPID PANEL
Cholesterol: 133 mg/dL (ref 0–200)
HDL: 65.5 mg/dL (ref >39.00)
LDL Cholesterol: 50 mg/dL (ref 0–99)
NonHDL: 67.07
Total CHOL/HDL Ratio: 2
Triglycerides: 87 mg/dL (ref 0.0–149.0)
VLDL: 17.4 mg/dL (ref 0.0–40.0)

## 2019-10-22 LAB — COMPREHENSIVE METABOLIC PANEL
ALT: 26 U/L (ref 0–35)
AST: 26 U/L (ref 0–37)
Albumin: 4.3 g/dL (ref 3.5–5.2)
Alkaline Phosphatase: 77 U/L (ref 39–117)
BUN: 16 mg/dL (ref 6–23)
CO2: 27 mEq/L (ref 19–32)
Calcium: 9.6 mg/dL (ref 8.4–10.5)
Chloride: 103 mEq/L (ref 96–112)
Creatinine, Ser: 1.04 mg/dL (ref 0.40–1.20)
GFR: 52.19 mL/min — ABNORMAL LOW (ref >60.00)
Glucose, Bld: 109 mg/dL — ABNORMAL HIGH (ref 70–99)
Potassium: 4.6 mEq/L (ref 3.5–5.1)
Sodium: 137 mEq/L (ref 135–145)
Total Bilirubin: 0.5 mg/dL (ref 0.2–1.2)
Total Protein: 7.4 g/dL (ref 6.0–8.3)

## 2019-10-22 LAB — CBC WITH DIFFERENTIAL/PLATELET
Basophils Absolute: 0.1 10*3/uL (ref 0.0–0.1)
Basophils Relative: 1.1 % (ref 0.0–3.0)
Eosinophils Absolute: 0.2 10*3/uL (ref 0.0–0.7)
Eosinophils Relative: 4.5 % (ref 0.0–5.0)
HCT: 39.4 % (ref 36.0–46.0)
Hemoglobin: 13.4 g/dL (ref 12.0–15.0)
Lymphocytes Relative: 39.9 % (ref 12.0–46.0)
Lymphs Abs: 2.1 10*3/uL (ref 0.7–4.0)
MCHC: 34.1 g/dL (ref 30.0–36.0)
MCV: 102.8 fl — ABNORMAL HIGH (ref 78.0–100.0)
Monocytes Absolute: 0.5 10*3/uL (ref 0.1–1.0)
Monocytes Relative: 9.1 % (ref 3.0–12.0)
Neutro Abs: 2.4 10*3/uL (ref 1.4–7.7)
Neutrophils Relative %: 45.4 % (ref 43.0–77.0)
Platelets: 210 10*3/uL (ref 150.0–400.0)
RBC: 3.83 Mil/uL — ABNORMAL LOW (ref 3.87–5.11)
RDW: 12.4 % (ref 11.5–15.5)
WBC: 5.2 10*3/uL (ref 4.0–10.5)

## 2019-10-22 LAB — HEMOGLOBIN A1C: Hgb A1c MFr Bld: 6 % (ref 4.6–6.5)

## 2019-10-22 LAB — VITAMIN B12: Vitamin B-12: 504 pg/mL (ref 211–911)

## 2019-10-22 LAB — TSH: TSH: 2.16 u[IU]/mL (ref 0.35–4.50)

## 2019-10-22 MED ORDER — PROMETHAZINE HCL 25 MG PO TABS: ORAL_TABLET | ORAL | 0 refills | Status: DC

## 2019-10-22 MED ORDER — PAROXETINE HCL ER 25 MG PO TB24: 25.0000 mg | ORAL_TABLET | Freq: Every day | ORAL | 5 refills | Status: DC

## 2019-10-22 NOTE — Patient Instructions (Addendum)
I am changing your Lexapro to Paxil for a more calming effect.    MAKE YOURSELF GO FOR A WALK EVERY DAY!  GOAL WT LOSS 10 LBS IN THE NEXT 6 MONTHS  Your annual mammogram has been ordered AND IS DUE AFTER July 15.  You are encouraged (required) to call to make your appointment at [facility]   Fatigue If you have fatigue, you feel tired all the time and have a lack of energy or a lack of motivation. Fatigue may make it difficult to start or complete tasks because of exhaustion. In general, occasional or mild fatigue is often a normal response to activity or life. However, long-lasting (chronic) or extreme fatigue may be a symptom of a medical condition. Follow these instructions at home: General instructions  Watch your fatigue for any changes.  Go to bed and get up at the same time every day.  Avoid fatigue by pacing yourself during the day and getting enough sleep at night.  Maintain a healthy weight. Medicines  Take over-the-counter and prescription medicines only as told by your health care provider.  Take a multivitamin, if told by your health care provider.  Do not use herbal or dietary supplements unless they are approved by your health care provider. Activity   Exercise regularly, as told by your health care provider.  Use or practice techniques to help you relax, such as yoga, tai chi, meditation, or massage therapy. Eating and drinking   Avoid heavy meals in the evening.  Eat a well-balanced diet, which includes lean proteins, whole grains, plenty of fruits and vegetables, and low-fat dairy products.  Avoid consuming too much caffeine.  Avoid the use of alcohol.  Drink enough fluid to keep your urine pale yellow. Lifestyle  Change situations that cause you stress. Try to keep your work and personal schedule in balance.  Do not use any products that contain nicotine or tobacco, such as cigarettes and e-cigarettes. If you need help quitting, ask  your health care provider.  Do not use drugs. Contact a health care provider if:  Your fatigue does not get better.  You have a fever.  You suddenly lose or gain weight.  You have headaches.  You have trouble falling asleep or sleeping through the night.  You feel angry, guilty, anxious, or sad.  You are unable to have a bowel movement (constipation).  Your skin is dry.  You have swelling in your legs or another part of your body. Get help right away if:  You feel confused.  Your vision is blurry.  You feel faint or you pass out.  You have a severe headache.  You have severe pain in your abdomen, your back, or the area between your waist and hips (pelvis).  You have chest pain, shortness of breath, or an irregular or fast heartbeat.  You are unable to urinate, or you urinate less than normal.  You have abnormal bleeding, such as bleeding from the rectum, vagina, nose, lungs, or nipples.  You vomit blood.  You have thoughts about hurting yourself or others. If you ever feel like you may hurt yourself or others, or have thoughts about taking your own life, get help right away. You can go to your nearest emergency department or call:  Your local emergency services (911 in the [place]  A suicide crisis helpline, such as the [place] at 971-877-8783. This is open 24 hours a day. Summary  If you have fatigue, you  feel tired all the time and have a lack of energy or a lack of motivation.  Fatigue may make it difficult to start or complete tasks because of exhaustion.  Long-lasting (chronic) or extreme fatigue may be a symptom of a medical condition.  Exercise regularly, as told by your health care provider.  Change situations that cause you stress. Try to keep your work and personal schedule in balance. This information is not intended to replace advice given to you by your health care provider. Make sure you discuss any questions you  have with your health care provider. Document Revised: 10/23/2018 Document Reviewed: 12/27/2016 Elsevier Patient Education  2020 [employer].

## 2019-10-22 NOTE — Progress Notes (Signed)
Subjective:  Patient ID: Erin Aguilar, female    DOB: 08-11-1950  Age: 69 y.o. MRN: [id number]  CC: The primary encounter diagnosis was B12 deficiency. Diagnoses of Nausea, Fatigue, unspecified type, Hyperlipidemia associated with type 2 diabetes mellitus (HCC), Polydipsia, Breast cancer screening by mammogram, Fatigue due to depression, Obesity (BMI 30.0-34.9), Anxiety state, and Prediabetes were also pertinent to this visit.  HPI AYAAT JANSMA presents for follow up on chronic issues including GAD /depression, chronic pain and fatigue    GAD:  Taking Lexapro and buspirone. Finds herself constantly worrying about her husband's medical issues .  Not sleeping well,  tensing up during TV time, when trying to go asleep.  Not exercising , but does a lot of yard work with a Education administrator.  Feels fatigued most of the time   Nausea ;  Intermittent ,  Sometimes associated with overeating.  History of gastric bypass.  No vomiting.  No use of NSAIDS . Takes Nexium .  No dark stools   Chronic pain : managed with cymbalta   Outpatient Medications Prior to Visit  Medication Sig Dispense Refill  . ALPRAZolam (XANAX) 0.25 MG tablet TAKE 1 TABLET AT BEDTIME AS NEEDED FOR ANXIETY 30 tablet 5  . aspirin 81 MG chewable tablet Chew 81 mg by mouth.    Marland Kitchen atorvastatin (LIPITOR) 80 MG tablet TAKE 1 TABLET BY MOUTH ONCE DAILY. 90 tablet 3  . busPIRone (BUSPAR) 10 MG tablet Take 1 tablet (10 mg total) by mouth 3 (three) times daily. 90 tablet 1  . butalbital-acetaminophen-caffeine (FIORICET WITH CODEINE) 50-325-40-30 MG capsule TAKE 1 CAPSULE BY MOUTH TWICE DAILY AS NEEDED FOR PAIN 60 capsule 2  . DULoxetine (CYMBALTA) 60 MG capsule TAKE (1) CAPSULE BY MOUTH ONCE DAILY. 90 capsule 0  . esomeprazole (NEXIUM) 40 MG capsule TAKE 1 CAPSULE IN THE MORNING BEFORE BREAKFAST. 90 capsule 3  . Multiple Vitamin (MULTIVITAMIN) tablet Take 1 tablet by mouth daily.    . pramipexole (MIRAPEX) 0.25 MG tablet TAKE ONE  TABLET BY MOUTH AT BEDTIME. 90 tablet 0  . PREMARIN vaginal cream Place 1 Applicatorful vaginally. Twice weekly    . UNABLE TO FIND Hemp Cannabinoid extract 1/2 dropper sublingual once daily for arthritis pain.    Marland Kitchen escitalopram (LEXAPRO) 20 MG tablet TAKE 1 TABLET BY MOUTH ONCE DAILY. 90 tablet 3  . promethazine (PHENERGAN) 25 MG tablet TAKE 1 TABLET BY MOUTH EVERY 6 HOURS AS NEEDED FOR NAUSEA & VOMITING 30 tablet 0  . trimethoprim (TRIMPEX) 100 MG tablet Take 100 mg by mouth daily. As needed (Patient not taking: Reported on 10/22/2019)     Facility-Administered Medications Prior to Visit  Medication Dose Route Frequency Provider Last Rate Last Admin  . 0.9 %  sodium chloride infusion  500 mL Intravenous Continuous Sherrilyn Rist, MD        Review of Systems;  Patient denies headache, fevers, malaise, unintentional weight loss, skin rash, eye pain, sinus congestion and sinus pain, sore throat, dysphagia,  hemoptysis , cough, dyspnea, wheezing, chest pain, palpitations, orthopnea, edema, abdominal pain,  melena, diarrhea, constipation, flank pain, dysuria, hematuria, urinary  Frequency, nocturia, numbness, tingling, seizures,  Focal weakness, Loss of consciousness,  Tremor,, depression,  and suicidal ideation.      Objective:  BP 118/76 (BP Location: Left Arm, Patient Position: Sitting, Cuff Size: Normal)   Pulse 63   Temp 97.6 F (36.4 C) (Temporal)   Resp 14   Ht 5\' 3"  (1.6  m)   Wt 186 lb 3.2 oz (84.5 kg)   SpO2 98%   BMI 32.98 kg/m   BP Readings from Last 3 Encounters:  10/22/19 118/76  06/04/19 99/79  11/28/18 110/78    Wt Readings from Last 3 Encounters:  10/22/19 186 lb 3.2 oz (84.5 kg)  06/04/19 185 lb (83.9 kg)  11/28/18 185 lb 12.8 oz (84.3 kg)    General appearance: alert, cooperative and appears stated age Ears: normal TM's and external ear canals both ears Throat: lips, mucosa, and tongue normal; teeth and gums normal Neck: no adenopathy, no carotid bruit,  supple, symmetrical, trachea midline and thyroid not enlarged, symmetric, no tenderness/mass/nodules Back: symmetric, no curvature. ROM normal. No CVA tenderness. Lungs: clear to auscultation bilaterally Heart: regular rate and rhythm, S1, S2 normal, no murmur, click, rub or gallop Abdomen: soft, non-tender; bowel sounds normal; no masses,  no organomegaly Pulses: 2+ and symmetric Skin: Skin color, texture, turgor normal. No rashes or lesions Lymph nodes: Cervical, supraclavicular, and axillary nodes normal. Psych: affect normal, makes good eye contact. No fidgeting,  Smiles easily.  Denies suicidal thoughts    Lab Results  Component Value Date   HGBA1C 6.0 10/22/2019   HGBA1C 5.8 (A) 11/28/2018   HGBA1C 5.7 12/14/2014    Lab Results  Component Value Date   CREATININE 1.04 10/22/2019   CREATININE 0.83 09/19/2018   CREATININE 0.89 03/18/2018    Lab Results  Component Value Date   WBC 5.2 10/22/2019   HGB 13.4 10/22/2019   HCT 39.4 10/22/2019   PLT 210.0 10/22/2019   GLUCOSE 109 (H) 10/22/2019   CHOL 133 10/22/2019   TRIG 87.0 10/22/2019   HDL 65.50 10/22/2019   LDLDIRECT 106.0 06/10/2012   LDLCALC 50 10/22/2019   ALT 26 10/22/2019   AST 26 10/22/2019   NA 137 10/22/2019   K 4.6 10/22/2019   CL 103 10/22/2019   CREATININE 1.04 10/22/2019   BUN 16 10/22/2019   CO2 27 10/22/2019   TSH 2.16 10/22/2019   HGBA1C 6.0 10/22/2019    MM 3D SCREEN BREAST BILATERAL  Result Date: 10/30/2018 CLINICAL DATA:  Screening. EXAM: DIGITAL SCREENING BILATERAL MAMMOGRAM WITH TOMO AND CAD COMPARISON:  Previous exam(s). ACR Breast Density Category b: There are scattered areas of fibroglandular density. FINDINGS: There are no findings suspicious for malignancy. Images were processed with CAD. IMPRESSION: No mammographic evidence of malignancy. A result letter of this screening mammogram will be mailed directly to the patient. RECOMMENDATION: Screening mammogram in one year. (Code:SM-B-01Y)  BI-RADS CATEGORY  1: Negative. Electronically Signed   By: Sande Brothers M.D.   On: 10/30/2018 08:58    Assessment & Plan:   Problem List Items Addressed This Visit      Unprioritized   Obesity (BMI 30.0-34.9)    I have addressed  BMI and recommended wt loss of 10% of body weigh over the next 6 months using a low glycemic index diet and regular exercise a minimum of 5 days per week.        Anxiety state    Her daytime anxiety had improved with higher Cymbalta dose, but she finds herself tensing up frequently at night .  Did not tolerate  trazodone trial for  management of insomnia , advised to limit use of alprazolam to early waking . Changing lexapro to Paxil R  And continue buspirone  10 mg bid/tid, avoiding increased use of alprazolam        Relevant Medications   PARoxetine (PAXIL  CR) 25 MG 24 hr tablet   B12 deficiency - Primary    Secondary to bariatric surgery.  Continue  monthly IM injections.   Lab Results  Component Value Date   VITAMINB12 504 10/22/2019         Relevant Orders   Vitamin B12 (Completed)   Fatigue due to depression    Screening labs normal.    Recommended participating in regular exercise program with goal of achieving a minimum of 30 minutes of aerobic activity 5 days per week.   Lab Results  Component Value Date   CREATININE 0.79 05/17/2015   Lab Results  Component Value Date   ALT 14 05/17/2015   AST 18 05/17/2015   ALKPHOS 69 05/17/2015   BILITOT 0.6 05/17/2015   Lab Results  Component Value Date   TSH 1.68 05/17/2015   Lab Results  Component Value Date   WBC 8.6 05/17/2015   HGB 14.7 05/17/2015   HCT 43.8 05/17/2015   MCV 92.7 05/17/2015   PLT 248.0 05/17/2015        Nausea    Etiology unclear ,  May be  related to anxiety and overeating.  Cbc and lfts normal.  Refill phenergan      Relevant Medications   promethazine (PHENERGAN) 25 MG tablet   Prediabetes    A1c checked due to patient's report of polydipsia and  polyuria.  Her  random glucose is not  elevated but her A1c suggests she is at risk for developing diabetes.  I recommend he follow a low glycemic index diet and particpate regularly in an aerobic  exercise activity.  We should check an A1c in 6 months.         Other Visit Diagnoses    Fatigue, unspecified type       Relevant Orders   Comprehensive metabolic panel (Completed)   TSH (Completed)   CBC with Differential/Platelet (Completed)   Hyperlipidemia associated with type 2 diabetes mellitus (HCC)       Relevant Orders   Lipid panel (Completed)   Polydipsia       Relevant Orders   Hemoglobin A1c (Completed)   Breast cancer screening by mammogram       Relevant Orders   MM 3D SCREEN BREAST BILATERAL      I have discontinued Steward Drone A. Knobel's escitalopram and trimethoprim. I am also having her start on PARoxetine. Additionally, I am having her maintain her multivitamin, UNABLE TO FIND, aspirin, esomeprazole, atorvastatin, pramipexole, Premarin, busPIRone, ALPRAZolam, butalbital-acetaminophen-caffeine, DULoxetine, and promethazine. We will continue to administer sodium chloride.  Meds ordered this encounter  Medications  . promethazine (PHENERGAN) 25 MG tablet    Sig: TAKE 1 TABLET BY MOUTH EVERY 6 HOURS AS NEEDED FOR NAUSEA & VOMITING    Dispense:  30 tablet    Refill:  0  . PARoxetine (PAXIL CR) 25 MG 24 hr tablet    Sig: Take 1 tablet (25 mg total) by mouth daily.    Dispense:  30 tablet    Refill:  5    Medications Discontinued During This Encounter  Medication Reason  . promethazine (PHENERGAN) 25 MG tablet Reorder  . trimethoprim (TRIMPEX) 100 MG tablet Patient has not taken in last 30 days  . escitalopram (LEXAPRO) 20 MG tablet     Follow-up: Return in about 4 weeks (around 11/19/2019).   Sherlene Shams, MD

## 2019-10-23 DIAGNOSIS — H34831 Tributary (branch) retinal vein occlusion, right eye, with macular edema: Secondary | ICD-10-CM | POA: Diagnosis not present

## 2019-10-25 DIAGNOSIS — R7303 Prediabetes: Secondary | ICD-10-CM | POA: Insufficient documentation

## 2019-10-25 DIAGNOSIS — R110 Nausea: Secondary | ICD-10-CM | POA: Insufficient documentation

## 2019-10-25 NOTE — Assessment & Plan Note (Signed)
I have addressed  BMI and recommended wt loss of 10% of body weigh over the next 6 months using a low glycemic index diet and regular exercise a minimum of 5 days per week.   

## 2019-10-25 NOTE — Assessment & Plan Note (Signed)
Secondary to bariatric surgery.  Continue  monthly IM injections.   Lab Results  Component Value Date   VITAMINB12 504 10/22/2019

## 2019-10-25 NOTE — Assessment & Plan Note (Signed)
Her daytime anxiety had improved with higher Cymbalta dose, but she finds herself tensing up frequently at night .  Did not tolerate  trazodone trial for  management of insomnia , advised to limit use of alprazolam to early waking . Changing lexapro to Paxil R  And continue buspirone  10 mg bid/tid, avoiding increased use of alprazolam

## 2019-10-25 NOTE — Assessment & Plan Note (Signed)
A1c checked due to patient's report of polydipsia and polyuria.  Her  random glucose is not  elevated but her A1c suggests she is at risk for developing diabetes.  I recommend he follow a low glycemic index diet and particpate regularly in an aerobic  exercise activity.  We should check an A1c in 6 months.

## 2019-10-25 NOTE — Assessment & Plan Note (Signed)
Etiology unclear ,  May be  related to anxiety and overeating.  Cbc and lfts normal.  Refill phenergan

## 2019-10-25 NOTE — Assessment & Plan Note (Signed)
Screening labs normal.    Recommended participating in regular exercise program with goal of achieving a minimum of 30 minutes of aerobic activity 5 days per week.   Lab Results  Component Value Date   CREATININE 0.79 05/17/2015   Lab Results  Component Value Date   ALT 14 05/17/2015   AST 18 05/17/2015   ALKPHOS 69 05/17/2015   BILITOT 0.6 05/17/2015   Lab Results  Component Value Date   TSH 1.68 05/17/2015   Lab Results  Component Value Date   WBC 8.6 05/17/2015   HGB 14.7 05/17/2015   HCT 43.8 05/17/2015   MCV 92.7 05/17/2015   PLT 248.0 05/17/2015

## 2019-11-01 ENCOUNTER — Other Ambulatory Visit: Payer: Self-pay | Admitting: Internal Medicine

## 2019-11-06 ENCOUNTER — Other Ambulatory Visit: Payer: Self-pay | Admitting: Internal Medicine

## 2019-11-25 ENCOUNTER — Ambulatory Visit: Payer: Medicare PPO | Admitting: Internal Medicine

## 2019-11-29 ENCOUNTER — Other Ambulatory Visit: Payer: Self-pay | Admitting: Internal Medicine

## 2019-12-09 ENCOUNTER — Other Ambulatory Visit: Payer: Self-pay

## 2019-12-09 ENCOUNTER — Encounter: Payer: Self-pay | Admitting: Internal Medicine

## 2019-12-09 ENCOUNTER — Ambulatory Visit: Payer: Medicare PPO | Admitting: Internal Medicine

## 2019-12-09 VITALS — BP 102/72 | HR 72 | Temp 98.4°F | Resp 15 | Ht 63.0 in | Wt 190.8 lb

## 2019-12-09 DIAGNOSIS — F411 Generalized anxiety disorder: Secondary | ICD-10-CM

## 2019-12-09 DIAGNOSIS — R609 Edema, unspecified: Secondary | ICD-10-CM

## 2019-12-09 DIAGNOSIS — R110 Nausea: Secondary | ICD-10-CM

## 2019-12-09 DIAGNOSIS — I700 Atherosclerosis of aorta: Secondary | ICD-10-CM

## 2019-12-09 MED ORDER — PROMETHAZINE HCL 25 MG PO TABS: ORAL_TABLET | ORAL | 0 refills | Status: DC

## 2019-12-09 MED ORDER — FUROSEMIDE 20 MG PO TABS: 20.0000 mg | ORAL_TABLET | Freq: Every day | ORAL | 3 refills | Status: DC

## 2019-12-09 NOTE — Assessment & Plan Note (Signed)
Suspect overeating may play a role since she would not be expected to gain weight if she had a gastric ulcer.  Trial of Beano and Mylanta Gas for 2 weeks.  Followed by addition of famotidine per dinner.  Continue nexium qam . If no improvement after adding famotidine will need GI referral for EGD

## 2019-12-09 NOTE — Assessment & Plan Note (Signed)
Her daytime anxiety had improved with higher Cymbalta dose, but she finds herself tensing up frequently at night .  Did not tolerate  trazodone trial for  management of insomnia , advised to limit use of alprazolam to early waking . Changing lexapro to Paxil R has helped. And using  buspirone  10 mg bid/tid, avoiding increased use of alprazolam

## 2019-12-09 NOTE — Progress Notes (Signed)
Subjective:  Patient ID: Erin Aguilar, female    DOB: [birth year]  Age: 69 y.o. MRN: [id number]  CC: The primary encounter diagnosis was Edema, unspecified type. Diagnoses of Nausea, Anxiety state, and Atherosclerosis of aorta (HCC) were also pertinent to this visit.  HPI Erin Aguilar presents for one month follow up  On recurrent nausea and anxiety   This visit occurred during the SARS-CoV-2 public health emergency.  Safety protocols were in place, including screening questions prior to the visit, additional usage of staff PPE, and extensive cleaning of exam room while observing appropriate contact time as indicated for disinfecting solutions.   Chronic NauseaL has been a problem since her bariatric surgery over 10 years ago.  Symptoms occur right after eating  Intermittent,  Can occur after  meal. Sometimes by cruciferous vegetables  Avoids fatty /fried foods and sweet. .  Occurs a few minutes after finishing meal. Sometimes after only a few bites, not just a full sized meal.   Doesn't occur daily , interval varies .  Accompanied by gassiness  Has always used phenergan for YEARS  To relieve symptoms.    2) GAD:  Tolerating the addition of Paxil  CR.  Feels is is lowering her anxiety level  Worries about husband mostly. .  No dose change today.  Feels less tense   3) Aortic atherosclerosis:  By prior CT,  Reviewed today .  Patient is asymptomatic and tolerating statin .   3) Obesity:  Weight gain noted. Walking daily in the morning  .   Outpatient Medications Prior to Visit  Medication Sig Dispense Refill  . aspirin 81 MG chewable tablet Chew 81 mg by mouth.    Marland Kitchen atorvastatin (LIPITOR) 80 MG tablet TAKE 1 TABLET BY MOUTH ONCE DAILY. 90 tablet 3  . busPIRone (BUSPAR) 10 MG tablet TAKE (1) TABLET BY MOUTH THREE TIMES DAILY 90 tablet 3  . butalbital-acetaminophen-caffeine (FIORICET WITH CODEINE) 50-325-40-30 MG capsule TAKE 1 CAPSULE BY MOUTH TWICE DAILY AS NEEDED FOR PAIN 60 capsule 2   . DULoxetine (CYMBALTA) 60 MG capsule TAKE (1) CAPSULE BY MOUTH ONCE DAILY. 90 capsule 0  . esomeprazole (NEXIUM) 40 MG capsule TAKE 1 CAPSULE IN THE MORNING BEFORE BREAKFAST. 90 capsule 3  . Multiple Vitamin (MULTIVITAMIN) tablet Take 1 tablet by mouth daily.    Marland Kitchen PARoxetine (PAXIL CR) 25 MG 24 hr tablet Take 1 tablet (25 mg total) by mouth daily. 30 tablet 5  . pramipexole (MIRAPEX) 0.25 MG tablet TAKE ONE TABLET BY MOUTH AT BEDTIME. 90 tablet 0  . PREMARIN vaginal cream Place 1 Applicatorful vaginally. Twice weekly    . UNABLE TO FIND Hemp Cannabinoid extract 1/2 dropper sublingual once daily for arthritis pain.    . promethazine (PHENERGAN) 25 MG tablet TAKE 1 TABLET BY MOUTH EVERY 6 HOURS AS NEEDED FOR NAUSEA & VOMITING 30 tablet 0  . ALPRAZolam (XANAX) 0.25 MG tablet TAKE 1 TABLET AT BEDTIME AS NEEDED FOR ANXIETY (Patient not taking: Reported on 12/09/2019) 30 tablet 5   Facility-Administered Medications Prior to Visit  Medication Dose Route Frequency Provider Last Rate Last Admin  . 0.9 %  sodium chloride infusion  500 mL Intravenous Continuous Sherrilyn Rist, MD        Review of Systems;  Patient denies headache, fevers, malaise, unintentional weight loss, skin rash, eye pain, sinus congestion and sinus pain, sore throat, dysphagia,  hemoptysis , cough, dyspnea, wheezing, chest pain, palpitations, orthopnea, edema, abdominal pain, nausea, melena,  diarrhea, constipation, flank pain, dysuria, hematuria, urinary  Frequency, nocturia, numbness, tingling, seizures,  Focal weakness, Loss of consciousness,  Tremor, insomnia, depression, anxiety, and suicidal ideation.      Objective:  BP 102/72 (BP Location: Left Arm, Patient Position: Sitting, Cuff Size: Large)   Pulse 72   Temp 98.4 F (36.9 C) (Oral)   Resp 15   Ht 5\' 3"  (1.6 m)   Wt 190 lb 12.8 oz (86.5 kg)   SpO2 98%   BMI 33.80 kg/m   BP Readings from Last 3 Encounters:  12/09/19 102/72  10/22/19 118/76  06/04/19  99/79    Wt Readings from Last 3 Encounters:  12/09/19 190 lb 12.8 oz (86.5 kg)  10/22/19 186 lb 3.2 oz (84.5 kg)  06/04/19 185 lb (83.9 kg)    General appearance: alert, cooperative and appears stated age Ears: normal TM's and external ear canals both ears Throat: lips, mucosa, and tongue normal; teeth and gums normal Neck: no adenopathy, no carotid bruit, supple, symmetrical, trachea midline and thyroid not enlarged, symmetric, no tenderness/mass/nodules Back: symmetric, no curvature. ROM normal. No CVA tenderness. Lungs: clear to auscultation bilaterally Heart: regular rate and rhythm, S1, S2 normal, no murmur, click, rub or gallop Abdomen: soft, non-tender; bowel sounds normal; no masses,  no organomegaly Pulses: 2+ and symmetric Skin: Skin color, texture, turgor normal. No rashes or lesions Lymph nodes: Cervical, supraclavicular, and axillary nodes normal.  Lab Results  Component Value Date   HGBA1C 6.0 10/22/2019   HGBA1C 5.8 (A) 11/28/2018   HGBA1C 5.7 12/14/2014    Lab Results  Component Value Date   CREATININE 1.04 10/22/2019   CREATININE 0.83 09/19/2018   CREATININE 0.89 03/18/2018    Lab Results  Component Value Date   WBC 5.2 10/22/2019   HGB 13.4 10/22/2019   HCT 39.4 10/22/2019   PLT 210.0 10/22/2019   GLUCOSE 109 (H) 10/22/2019   CHOL 133 10/22/2019   TRIG 87.0 10/22/2019   HDL 65.50 10/22/2019   LDLDIRECT 106.0 06/10/2012   LDLCALC 50 10/22/2019   ALT 26 10/22/2019   AST 26 10/22/2019   NA 137 10/22/2019   K 4.6 10/22/2019   CL 103 10/22/2019   CREATININE 1.04 10/22/2019   BUN 16 10/22/2019   CO2 27 10/22/2019   TSH 2.16 10/22/2019   HGBA1C 6.0 10/22/2019    MM 3D SCREEN BREAST BILATERAL  Result Date: 10/30/2018 CLINICAL DATA:  Screening. EXAM: DIGITAL SCREENING BILATERAL MAMMOGRAM WITH TOMO AND CAD COMPARISON:  Previous exam(s). ACR Breast Density Category b: There are scattered areas of fibroglandular density. FINDINGS: There are no  findings suspicious for malignancy. Images were processed with CAD. IMPRESSION: No mammographic evidence of malignancy. A result letter of this screening mammogram will be mailed directly to the patient. RECOMMENDATION: Screening mammogram in one year. (Code:SM-B-01Y) BI-RADS CATEGORY  1: Negative. Electronically Signed   By: 11/01/2018 M.D.   On: 10/30/2018 08:58    Assessment & Plan:   Problem List Items Addressed This Visit      Unprioritized   Anxiety state    Her daytime anxiety had improved with higher Cymbalta dose, but she finds herself tensing up frequently at night .  Did not tolerate  trazodone trial for  management of insomnia , advised to limit use of alprazolam to early waking . Changing lexapro to Paxil R has helped. And using  buspirone  10 mg bid/tid, avoiding increased use of alprazolam        Atherosclerosis of  aorta [facility])    Reviewed today,  An Incidental finding on June 2020 abdominal CT .  LDL is < 70 on atorvastatin.  No changes today   Lab Results  Component Value Date   CHOL 133 10/22/2019   HDL 65.50 10/22/2019   LDLCALC 50 10/22/2019   LDLDIRECT 106.0 06/10/2012   TRIG 87.0 10/22/2019   CHOLHDL 2 10/22/2019         Relevant Medications   furosemide (LASIX) 20 MG tablet   Nausea    Suspect overeating may play a role since she would not be expected to gain weight if she had a gastric ulcer.  Trial of Beano and Mylanta Gas for 2 weeks.  Followed by addition of famotidine per dinner.  Continue nexium qam . If no improvement after adding famotidine will need GI referral for EGD       Relevant Medications   promethazine (PHENERGAN) 25 MG tablet   promethazine (PHENERGAN) 25 MG tablet    Other Visit Diagnoses    Edema, unspecified type    -  Primary   Relevant Medications   furosemide (LASIX) 20 MG tablet      I am having Jenah A. Cathey start on furosemide. I am also having her maintain her multivitamin, UNABLE TO FIND, aspirin, esomeprazole,  atorvastatin, Premarin, ALPRAZolam, butalbital-acetaminophen-caffeine, PARoxetine, pramipexole, busPIRone, DULoxetine, promethazine, and promethazine. We will continue to administer sodium chloride.  Meds ordered this encounter  Medications  . promethazine (PHENERGAN) 25 MG tablet    Sig: TAKE 1 TABLET BY MOUTH EVERY 6 HOURS AS NEEDED FOR NAUSEA & VOMITING    Dispense:  30 tablet    Refill:  0  . promethazine (PHENERGAN) 25 MG tablet    Sig: TAKE 1 TABLET BY MOUTH EVERY 6 HOURS AS NEEDED FOR NAUSEA & VOMITING    Dispense:  30 tablet    Refill:  0  . furosemide (LASIX) 20 MG tablet    Sig: Take 1 tablet (20 mg total) by mouth daily.    Dispense:  30 tablet    Refill:  3    Medications Discontinued During This Encounter  Medication Reason  . promethazine (PHENERGAN) 25 MG tablet Reorder    Follow-up: Return in about 6 months (around 06/10/2020).   Sherlene Shams, MD

## 2019-12-09 NOTE — Patient Instructions (Addendum)
For the nausea,  We will  first treat the gassy distension :  Try  Beano before each meal with vegetables  to see if it prevents the gassiness and the nausea   If this doesn't help you can add mylanta gas after the meals    Follow up by phone in 2 week s with Shanda Bumps.  If symptoms have not improved ,  We will have a Plan B     You are prediabetic:  Please be careful with your diet.  Less sugars  And starches  More vegetables and lean proteins

## 2019-12-09 NOTE — Assessment & Plan Note (Signed)
Reviewed today,  An Incidental finding on June 2020 abdominal CT .  LDL is < 70 on atorvastatin.  No changes today   Lab Results  Component Value Date   CHOL 133 10/22/2019   HDL 65.50 10/22/2019   LDLCALC 50 10/22/2019   LDLDIRECT 106.0 06/10/2012   TRIG 87.0 10/22/2019   CHOLHDL 2 10/22/2019

## 2019-12-18 DIAGNOSIS — H34831 Tributary (branch) retinal vein occlusion, right eye, with macular edema: Secondary | ICD-10-CM | POA: Diagnosis not present

## 2019-12-18 DIAGNOSIS — H2511 Age-related nuclear cataract, right eye: Secondary | ICD-10-CM | POA: Diagnosis not present

## 2019-12-24 ENCOUNTER — Ambulatory Visit
Admission: RE | Admit: 2019-12-24 | Discharge: 2019-12-24 | Disposition: A | Discharge status: Home / Self Care | Payer: Medicare PPO | Source: Ambulatory Visit | Attending: Internal Medicine | Admitting: Internal Medicine

## 2019-12-24 ENCOUNTER — Other Ambulatory Visit: Payer: Self-pay

## 2019-12-24 DIAGNOSIS — Z1231 Encounter for screening mammogram for malignant neoplasm of breast: Secondary | ICD-10-CM | POA: Diagnosis not present

## 2019-12-26 ENCOUNTER — Ambulatory Visit: Payer: Medicare Other

## 2020-01-02 ENCOUNTER — Other Ambulatory Visit: Payer: Self-pay | Admitting: Internal Medicine

## 2020-01-05 ENCOUNTER — Other Ambulatory Visit: Payer: Self-pay | Admitting: Internal Medicine

## 2020-01-06 NOTE — Telephone Encounter (Signed)
Refill request for fioricet, last seen 12-09-19, last filled 07-29-19.  Please advise.

## 2020-01-08 ENCOUNTER — Telehealth: Payer: Self-pay

## 2020-01-08 ENCOUNTER — Ambulatory Visit (INDEPENDENT_AMBULATORY_CARE_PROVIDER_SITE_OTHER): Payer: Medicare PPO

## 2020-01-08 VITALS — Ht 63.0 in | Wt 190.0 lb

## 2020-01-08 DIAGNOSIS — Z0000 Encounter for general adult medical examination without abnormal findings: Secondary | ICD-10-CM

## 2020-01-08 NOTE — Patient Instructions (Addendum)
Erin Aguilar , Thank you for taking time to come for your Medicare Wellness Visit. I appreciate your ongoing commitment to your health goals. Please review the following plan we discussed and let me know if I can assist you in the future.   These are the goals we discussed: Goals      Patient Stated   .  DIET - INCREASE WATER INTAKE (pt-stated)      Other   .  Increase physical activity      Walk for exercise 15 minutes daily       This is a list of the screening recommended for you and due dates:  Health Maintenance  Topic Date Due  . Flu Shot  07/15/2020*  . Mammogram  12/23/2020  . Tetanus Vaccine  01/23/2023  . Colon Cancer Screening  12/28/2025  . DEXA scan (bone density measurement)  Completed  . COVID-19 Vaccine  Completed  .  Hepatitis C: One time screening is recommended by Center for Disease Control  (CDC) for  adults born from [birth year] through 1967.   Completed  . Pneumonia vaccines  Completed  *Topic was postponed. The date shown is not the original due date.   Preventive Care 25 Years and Older, Female Preventive care refers to lifestyle choices and visits with your health care provider that can promote health and wellness. What does preventive care include?  A yearly physical exam. This is also called an annual well check.  Dental exams once or twice a year.  Routine eye exams. Ask your health care provider how often you should have your eyes checked.  Personal lifestyle choices, including:  Daily care of your teeth and gums.  Regular physical activity.  Eating a healthy diet.  Avoiding tobacco and drug use.  Limiting alcohol use.  Practicing safe sex.  Taking low-dose aspirin every day.  Taking vitamin and mineral supplements as recommended by your health care provider. What happens during an annual well check? The services and screenings done by your health care provider during your annual well check will depend on your age, overall health,  lifestyle risk factors, and family history of disease. Counseling  Your health care provider may ask you questions about your:  Alcohol use.  Tobacco use.  Drug use.  Emotional well-being.  Home and relationship well-being.  Sexual activity.  Eating habits.  History of falls.  Memory and ability to understand (cognition).  Work and work Astronomer.  Reproductive health. Screening  You may have the following tests or measurements:  Height, weight, and BMI.  Blood pressure.  Lipid and cholesterol levels. These may be checked every 5 years, or more frequently if you are over 108 years old.  Skin check.  Lung cancer screening. You may have this screening every year starting at age 24 if you have a 30-pack-year history of smoking and currently smoke or have quit within the past 15 years.  Fecal occult blood test (FOBT) of the stool. You may have this test every year starting at age 59.  Flexible sigmoidoscopy or colonoscopy. You may have a sigmoidoscopy every 5 years or a colonoscopy every 10 years starting at age 5.  Hepatitis C blood test.  Hepatitis B blood test.  Sexually transmitted disease (STD) testing.  Diabetes screening. This is done by checking your blood sugar (glucose) after you have not eaten for a while (fasting). You may have this done every 1-3 years.  Bone density scan. This is done to screen for osteoporosis. You  may have this done starting at age 20.  Mammogram. This may be done every 1-2 years. Talk to your health care provider about how often you should have regular mammograms. Talk with your health care provider about your test results, treatment options, and if necessary, the need for more tests. Vaccines  Your health care provider may recommend certain vaccines, such as:  Influenza vaccine. This is recommended every year.  Tetanus, diphtheria, and acellular pertussis (Tdap, Td) vaccine. You may need a Td booster every 10 years.  Zoster  vaccine. You may need this after age 30.  Pneumococcal 13-valent conjugate (PCV13) vaccine. One dose is recommended after age 39.  Pneumococcal polysaccharide (PPSV23) vaccine. One dose is recommended after age 49. Talk to your health care provider about which screenings and vaccines you need and how often you need them. This information is not intended to replace advice given to you by your health care provider. Make sure you discuss any questions you have with your health care provider. Document Released: 04/30/2015 Document Revised: 12/22/2015 Document Reviewed: 02/02/2015 Elsevier Interactive Patient Education  2017 [employer].  Fall Prevention in the Home Falls can cause injuries. They can happen to people of all ages. There are many things you can do to make your home safe and to help prevent falls. What can I do on the outside of my home?  Regularly fix the edges of walkways and driveways and fix any cracks.  Remove anything that might make you trip as you walk through a door, such as a raised step or threshold.  Trim any bushes or trees on the path to your home.  Use bright outdoor lighting.  Clear any walking paths of anything that might make someone trip, such as rocks or tools.  Regularly check to see if handrails are loose or broken. Make sure that both sides of any steps have handrails.  Any raised decks and porches should have guardrails on the edges.  Have any leaves, snow, or ice cleared regularly.  Use sand or salt on walking paths during winter.  Clean up any spills in your garage right away. This includes oil or grease spills. What can I do in the bathroom?  Use night lights.  Install grab bars by the toilet and in the tub and shower. Do not use towel bars as grab bars.  Use non-skid mats or decals in the tub or shower.  If you need to sit down in the shower, use a plastic, non-slip stool.  Keep the floor dry. Clean up any water that spills on the  floor as soon as it happens.  Remove soap buildup in the tub or shower regularly.  Attach bath mats securely with double-sided non-slip rug tape.  Do not have throw rugs and other things on the floor that can make you trip. What can I do in the bedroom?  Use night lights.  Make sure that you have a light by your bed that is easy to reach.  Do not use any sheets or blankets that are too big for your bed. They should not hang down onto the floor.  Have a firm chair that has side arms. You can use this for support while you get dressed.  Do not have throw rugs and other things on the floor that can make you trip. What can I do in the kitchen?  Clean up any spills right away.  Avoid walking on wet floors.  Keep items that you use a lot  in easy-to-reach places.  If you need to reach something above you, use a strong step stool that has a grab bar.  Keep electrical cords out of the way.  Do not use floor polish or wax that makes floors slippery. If you must use wax, use non-skid floor wax.  Do not have throw rugs and other things on the floor that can make you trip. What can I do with my stairs?  Do not leave any items on the stairs.  Make sure that there are handrails on both sides of the stairs and use them. Fix handrails that are broken or loose. Make sure that handrails are as long as the stairways.  Check any carpeting to make sure that it is firmly attached to the stairs. Fix any carpet that is loose or worn.  Avoid having throw rugs at the top or bottom of the stairs. If you do have throw rugs, attach them to the floor with carpet tape.  Make sure that you have a light switch at the top of the stairs and the bottom of the stairs. If you do not have them, ask someone to add them for you. What else can I do to help prevent falls?  Wear shoes that:  Do not have high heels.  Have rubber bottoms.  Are comfortable and fit you well.  Are closed at the toe. Do not wear  sandals.  If you use a stepladder:  Make sure that it is fully opened. Do not climb a closed stepladder.  Make sure that both sides of the stepladder are locked into place.  Ask someone to hold it for you, if possible.  Clearly mark and make sure that you can see:  Any grab bars or handrails.  First and last steps.  Where the edge of each step is.  Use tools that help you move around (mobility aids) if they are needed. These include:  Canes.  Walkers.  Scooters.  Crutches.  Turn on the lights when you go into a dark area. Replace any light bulbs as soon as they burn out.  Set up your furniture so you have a clear path. Avoid moving your furniture around.  If any of your floors are uneven, fix them.  If there are any pets around you, be aware of where they are.  Review your medicines with your doctor. Some medicines can make you feel dizzy. This can increase your chance of falling. Ask your doctor what other things that you can do to help prevent falls. This information is not intended to replace advice given to you by your health care provider. Make sure you discuss any questions you have with your health care provider. Document Released: 01/28/2009 Document Revised: 09/09/2015 Document Reviewed: 05/08/2014 Elsevier Interactive Patient Education  2017 [employer].

## 2020-01-08 NOTE — Telephone Encounter (Signed)
Patient call connected after several tries. Appointment started and disconnected again. Nurse called both numbers on chart again, no answer. Will note accordingly. Visit complete.

## 2020-01-08 NOTE — Progress Notes (Addendum)
Subjective:   Erin MeyerBrenda A Aguilar is a 69 y.o. female who presents for Medicare Annual (Subsequent) preventive examination.  Review of Systems    No ROS.  Medicare Wellness Virtual Visit.   Cardiac Risk Factors include: advanced age (>2655men, 88>65 women)     Objective:    Today's Vitals   01/08/20 1346  Weight: 190 lb (86.2 kg)  Height: 5\' 3"  (1.6 m)   Body mass index is 33.66 kg/m.  Advanced Directives 01/08/2020 12/25/2018 12/10/2017 09/12/2017 12/06/2016 11/21/2015  Does Patient Have a Medical Advance Directive? Yes Yes Yes Yes Yes No  Type of Estate agentAdvance Directive Healthcare Power of [place]orney;Living will Healthcare Power of [place]orney;Living will Living will;Healthcare Power of State Street Corporationttorney Healthcare Power of [place]orney;Living will Healthcare Power of [place];Living will -  Does patient want to make changes to medical advance directive? No - Patient declined No - Patient declined No - Patient declined - No - Patient declined -  Copy of Healthcare Power of Attorney in Chart? Yes - validated most recent copy scanned in chart (See row information) Yes - validated most recent copy scanned in chart (See row information) Yes Yes No - copy requested -    Current Medications (verified) Outpatient Encounter Medications as of 01/08/2020  Medication Sig  . ALPRAZolam (XANAX) 0.25 MG tablet TAKE 1 TABLET AT BEDTIME AS NEEDED FOR ANXIETY (Patient not taking: Reported on 12/09/2019)  . aspirin 81 MG chewable tablet Chew 81 mg by mouth.  Marland Kitchen. atorvastatin (LIPITOR) 80 MG tablet TAKE 1 TABLET BY MOUTH ONCE DAILY.  . busPIRone (BUSPAR) 10 MG tablet TAKE (1) TABLET BY MOUTH THREE TIMES DAILY  . butalbital-acetaminophen-caffeine (FIORICET WITH CODEINE) 50-325-40-30 MG capsule TAKE 1 CAPSULE BY MOUTH TWICE DAILY AS NEEDED FOR PAIN  . DULoxetine (CYMBALTA) 60 MG capsule TAKE (1) CAPSULE BY MOUTH ONCE DAILY.  Marland Kitchen. esomeprazole (NEXIUM) 40 MG capsule TAKE 1 CAPSULE IN THE MORNING BEFORE BREAKFAST.  . furosemide (LASIX) 20 MG  tablet Take 1 tablet (20 mg total) by mouth daily.  . Multiple Vitamin (MULTIVITAMIN) tablet Take 1 tablet by mouth daily.  Marland Kitchen. PARoxetine (PAXIL CR) 25 MG 24 hr tablet Take 1 tablet (25 mg total) by mouth daily.  . pramipexole (MIRAPEX) 0.25 MG tablet TAKE ONE TABLET BY MOUTH AT BEDTIME.  Marland Kitchen. PREMARIN vaginal cream Place 1 Applicatorful vaginally. Twice weekly  . promethazine (PHENERGAN) 25 MG tablet TAKE 1 TABLET BY MOUTH EVERY 6 HOURS AS NEEDED FOR NAUSEA & VOMITING  . promethazine (PHENERGAN) 25 MG tablet TAKE 1 TABLET BY MOUTH EVERY 6 HOURS AS NEEDED FOR NAUSEA & VOMITING  . UNABLE TO FIND Hemp Cannabinoid extract 1/2 dropper sublingual once daily for arthritis pain.   Facility-Administered Encounter Medications as of 01/08/2020  Medication  . 0.9 %  sodium chloride infusion    Allergies (verified) Ciprofloxacin, Oxycodone, and Trazodone and nefazodone   History: Past Medical History:  Diagnosis Date  . Allergy    Hay fever  . Arthritis   . Chicken pox   . Colon polyp   . Diverticulitis   . GERD (gastroesophageal reflux disease)   . H/O: pertussis   . Headache(784.0)   . 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 Hemorrhoids, internal, with bleeding    occasional  . Hyperlipidemia   . Migraines   . UTI (lower urinary tract infection)    Past Surgical History:  Procedure Laterality Date  . ABDOMINAL HYSTERECTOMY  1988  . CHOLECYSTECTOMY  1976  . GASTRIC BYPASS  03/2010  . HERNIA REPAIR  2008   ventral ,  from scar tissue, [place]  . JOINT REPLACEMENT Bilateral 02/26/2014   Dr. Howell Rucks   Family History  Problem Relation Age of Onset  . Arthritis Mother        rheumatoid   . Cancer Mother        lymphoma  . Early death Mother 7       lymphoma  . Arthritis Father   . Stroke Father   . Cancer Sister 90       cervical ca  . Kidney disease Brother   . Cancer Brother        lung ca,  smoker  . Arthritis Maternal Grandmother   . Heart disease  Maternal Grandmother   . Diabetes Maternal Grandmother   . Arthritis Maternal Grandfather   . Heart disease Maternal Grandfather   . Diabetes Maternal Grandfather   . Breast cancer Neg Hx    Social History   Socioeconomic History  . Marital status: Married    Spouse name: Not on file  . Number of children: 3  . Years of education: Not on file  . Highest education level: Not on file  Occupational History  . Occupation: retired  Tobacco Use  . Smoking status: Former Smoker    Quit date: 12/08/1986    Years since quitting: 33.1  . Smokeless tobacco: Never Used  Vaping Use  . Vaping Use: Never used  Substance and Sexual Activity  . Alcohol use: Yes    Alcohol/week: 3.0 standard drinks    Types: 3 Standard drinks or equivalent per week  . Drug use: No  . Sexual activity: Not on file  Other Topics Concern  . Not on file  Social History Narrative  . Not on file   Social Determinants of Health   Financial Resource Strain: Low Risk   . Difficulty of Paying Living Expenses: Not hard at all  Food Insecurity: No Food Insecurity  . Worried About Programme researcher, broadcasting/film/video in the Last Year: Never true  . Ran Out of Food in the Last Year: Never true  Transportation Needs: No Transportation Needs  . Lack of Transportation (Medical): No  . Lack of Transportation (Non-Medical): No  Physical Activity:   . Days of Exercise per Week: Not on file  . Minutes of Exercise per Session: Not on file  Stress: No Stress Concern Present  . Feeling of Stress : Not at all  Social Connections: Unknown  . Frequency of Communication with Friends and Family: Not on file  . Frequency of Social Gatherings with Friends and Family: Not on file  . Attends Religious Services: Not on file  . Active Member of Clubs or Organizations: Not on file  . Attends Banker Meetings: Not on file  . Marital Status: Married    Tobacco Counseling Counseling given: Not Answered   Clinical  Intake:  Pre-visit preparation completed: Yes        Diabetes: No  How often do you need to have someone help you when you read instructions, pamphlets, or other written materials from your doctor or pharmacy?: 1 - Never  Interpreter Needed?: No      Activities of Daily Living In your present state of health, do you have any difficulty performing the following activities: 01/08/2020  Hearing? N  Vision? N  Difficulty concentrating or making decisions? N  Walking or climbing stairs? N  Dressing or bathing? N  Doing  errands, shopping? N  Preparing Food and eating ? N  Using the Toilet? N  In the past six months, have you accidently leaked urine? N  Do you have problems with loss of bowel control? N  Managing your Medications? N  Managing your Finances? N  Housekeeping or managing your Housekeeping? N  Some recent data might be hidden    Patient Care Team: Sherlene Shams, MD as PCP - General (Internal Medicine)  Indicate any recent Medical Services you may have received from other than [facility] providers in the past year (date may be approximate).     Assessment:   This is a routine wellness examination for Laryn.  I connected with Ahlia today by telephone and verified that I am speaking with the correct person using two identifiers. Location patient: home Location provider: work Persons participating in the virtual visit: patient, Engineer, civil (consulting).    I discussed the limitations, risks, security and privacy concerns of performing an evaluation and management service by telephone and the availability of in person appointments. The patient expressed understanding and verbally consented to this telephonic visit.    Interactive audio and video telecommunications were attempted between this provider and patient, however failed, due to patient having technical difficulties OR patient did not have access to video capability.  We continued and completed visit with audio only.  Some  vital signs may be absent or patient reported.   Hearing/Vision screen  Hearing Screening   125Hz  250Hz  500Hz  1000Hz  2000Hz  3000Hz  4000Hz  6000Hz  8000Hz   Right ear:           Left ear:           Comments: Patient is able to hear conversational tones without difficulty. No issues reported.   Vision Screening Comments: Wears corrective lenses Visual acuity not assessed, virtual visit. They have seen their ophthalmologist in the last 12 months.  Dietary issues and exercise activities discussed:    Goals      Patient Stated   .  DIET - INCREASE WATER INTAKE (pt-stated)      Other   .  Increase physical activity      Walk for exercise 15 minutes daily      Depression Screen PHQ 2/9 Scores 01/08/2020 10/22/2019 12/25/2018 12/10/2017 04/19/2017 12/06/2016 11/29/2015  PHQ - 2 Score 0 2 0 0 0 0 0  PHQ- 9 Score - 7 - - 0 0 -    Fall Risk Fall Risk  01/08/2020 12/09/2019 10/22/2019 06/04/2019 12/25/2018  Falls in the past year? 0 0 0 0 0  Number falls in past yr: 0 - - - -  Follow up Falls evaluation completed Falls evaluation completed Falls evaluation completed Falls evaluation completed -   Handrails in use when climbing stairs? Yes Home free of loose throw rugs in walkways, pet beds, electrical cords, etc? Yes  Adequate lighting in your home to reduce risk of falls? Yes   TIMED UP AND GO: Was the test performed? No . Virtual visit.    Cognitive Function: MMSE - Mini Mental State Exam 12/10/2017 12/06/2016  Orientation to time 5 5  Orientation to Place 5 5  Registration 3 3  Attention/ Calculation 5 5  Recall 3 3  Language- name 2 objects 2 -  Language- repeat 1 1  Language- follow 3 step command 3 -  Language- read & follow direction 1 1  Write a sentence 1 -  Copy design 1 1  Total score 30 -  6CIT Screen 12/25/2018  What Year? 0 points  What month? 0 points  What time? 0 points  Count back from 20 0 points  Months in reverse 0 points  Repeat phrase 0 points  Total Score 0     Immunizations Immunization History  Administered Date(s) Administered  . Hepatitis A, Adult 02/20/2013  . Hepatitis B 07/24/1994, 08/23/1994, 01/23/1995  . Influenza, High Dose Seasonal PF 01/12/2016, 12/06/2016  . Influenza,inj,Quad PF,6+ Mos 12/14/2014  . Influenza,inj,quad, With Preservative 01/15/2018, 02/03/2019  . Influenza-Unspecified 01/21/2013  . Moderna SARS-COVID-2 Vaccination 04/28/2019, 05/26/2019  . Pneumococcal Conjugate-13 01/22/2013  . Pneumococcal Polysaccharide-23 07/27/2014  . Tdap 01/22/2013  . Zoster 01/23/2012  . Zoster Recombinat (Shingrix) 10/31/2017, 01/03/2018   Health Maintenance There are no preventive care reminders to display for this patient. Health Maintenance  Topic Date Due  . INFLUENZA VACCINE  07/15/2020 (Originally 11/16/2019)  . MAMMOGRAM  12/23/2020  . TETANUS/TDAP  01/23/2023  . COLONOSCOPY  12/28/2025  . DEXA SCAN  Completed  . COVID-19 Vaccine  Completed  . Hepatitis C Screening  Completed  . PNA vac Low Risk Adult  Completed   Dental Screening: Recommended annual dental exams for proper oral hygiene  Community Resource Referral / Chronic Care Management: CRR required this visit?  No   CCM required this visit?  No      Plan:   Keep all routine maintenance appointments.   Follow up 06/10/20 @ 8:30  I have personally reviewed and noted the following in the patient's chart:   . Medical and social history . Use of alcohol, tobacco or illicit drugs  . Current medications and supplements . Functional ability and status . Nutritional status . Physical activity . Advanced directives . List of other physicians . Hospitalizations, surgeries, and ER visits in previous 12 months . Vitals . Screenings to include cognitive, depression, and falls . Referrals and appointments  In addition, I have reviewed and discussed with patient certain preventive protocols, quality metrics, and best practice recommendations. A written  personalized care plan for preventive services as well as general preventive health recommendations were provided to patient via mail.     OBrien-Blaney, Hansini Clodfelter L, LPN   [id number]    I have reviewed the above information and agree with above.   Duncan Dull, MD

## 2020-01-08 NOTE — Telephone Encounter (Signed)
Failed to reach patient for scheduled awv. Called both numbers listed as preferred for this visit. Left msg to call back within allotted timeframe or reschedule.

## 2020-01-09 ENCOUNTER — Other Ambulatory Visit: Payer: Self-pay | Admitting: Internal Medicine

## 2020-01-21 DIAGNOSIS — Z872 Personal history of diseases of the skin and subcutaneous tissue: Secondary | ICD-10-CM | POA: Diagnosis not present

## 2020-01-21 DIAGNOSIS — L578 Other skin changes due to chronic exposure to nonionizing radiation: Secondary | ICD-10-CM | POA: Diagnosis not present

## 2020-01-21 DIAGNOSIS — Z86018 Personal history of other benign neoplasm: Secondary | ICD-10-CM | POA: Diagnosis not present

## 2020-01-21 DIAGNOSIS — Z859 Personal history of malignant neoplasm, unspecified: Secondary | ICD-10-CM | POA: Diagnosis not present

## 2020-02-26 ENCOUNTER — Other Ambulatory Visit: Payer: Self-pay | Admitting: Internal Medicine

## 2020-03-01 DIAGNOSIS — H34831 Tributary (branch) retinal vein occlusion, right eye, with macular edema: Secondary | ICD-10-CM | POA: Diagnosis not present

## 2020-03-02 ENCOUNTER — Telehealth: Payer: Self-pay

## 2020-03-02 NOTE — Telephone Encounter (Signed)
Medication has been approved through 04/16/2021.

## 2020-03-02 NOTE — Telephone Encounter (Signed)
PA for paxil has been submitted on covermymeds.

## 2020-03-08 ENCOUNTER — Other Ambulatory Visit: Payer: Self-pay | Admitting: Internal Medicine

## 2020-03-08 NOTE — Telephone Encounter (Signed)
LS:12-09-19 LO:01-06-20

## 2020-03-09 ENCOUNTER — Other Ambulatory Visit: Payer: Self-pay

## 2020-03-09 MED ORDER — PAROXETINE HCL ER 25 MG PO TB24
25.0000 mg | ORAL_TABLET | Freq: Every day | ORAL | 5 refills | Status: DC
Start: 2020-03-09 — End: 2020-10-13

## 2020-03-10 ENCOUNTER — Telehealth: Payer: Self-pay

## 2020-03-10 MED ORDER — BUTALBITAL-APAP-CAFF-COD 50-325-40-30 MG PO CAPS
ORAL_CAPSULE | ORAL | 2 refills | Status: DC
Start: 2020-03-10 — End: 2020-04-29

## 2020-03-10 NOTE — Telephone Encounter (Signed)
RX refill: Fioricet w/ condeine LS: 12-09-19 LO:01-06-20

## 2020-03-10 NOTE — Addendum Note (Signed)
Addended by: Sherlene Shams on: 03/10/2020 04:47 PM   Modules accepted: Orders

## 2020-03-18 DIAGNOSIS — M3501 Sicca syndrome with keratoconjunctivitis: Secondary | ICD-10-CM | POA: Diagnosis not present

## 2020-04-29 ENCOUNTER — Other Ambulatory Visit: Payer: Self-pay | Admitting: Internal Medicine

## 2020-04-29 NOTE — Telephone Encounter (Signed)
RX Refill:fioricet Last Seen:12-09-19 Last ordered:03-10-20

## 2020-05-17 ENCOUNTER — Other Ambulatory Visit: Payer: Self-pay | Admitting: Internal Medicine

## 2020-05-31 DIAGNOSIS — H34831 Tributary (branch) retinal vein occlusion, right eye, with macular edema: Secondary | ICD-10-CM | POA: Diagnosis not present

## 2020-06-03 ENCOUNTER — Other Ambulatory Visit: Payer: Self-pay | Admitting: Internal Medicine

## 2020-06-09 ENCOUNTER — Other Ambulatory Visit: Payer: Self-pay | Admitting: Internal Medicine

## 2020-06-09 DIAGNOSIS — R609 Edema, unspecified: Secondary | ICD-10-CM

## 2020-06-10 ENCOUNTER — Encounter: Payer: Self-pay | Admitting: Internal Medicine

## 2020-06-10 ENCOUNTER — Ambulatory Visit: Payer: Medicare PPO | Admitting: Internal Medicine

## 2020-06-10 ENCOUNTER — Other Ambulatory Visit: Payer: Self-pay

## 2020-06-10 VITALS — BP 124/88 | HR 70 | Temp 97.5°F | Ht 62.99 in | Wt 184.8 lb

## 2020-06-10 DIAGNOSIS — R252 Cramp and spasm: Secondary | ICD-10-CM | POA: Diagnosis not present

## 2020-06-10 DIAGNOSIS — R7303 Prediabetes: Secondary | ICD-10-CM

## 2020-06-10 DIAGNOSIS — E669 Obesity, unspecified: Secondary | ICD-10-CM

## 2020-06-10 DIAGNOSIS — N1831 Chronic kidney disease, stage 3a: Secondary | ICD-10-CM

## 2020-06-10 LAB — TSH: TSH: 2.31 u[IU]/mL (ref 0.35–4.50)

## 2020-06-10 LAB — COMPREHENSIVE METABOLIC PANEL
ALT: 21 U/L (ref 0–35)
AST: 28 U/L (ref 0–37)
Albumin: 4.5 g/dL (ref 3.5–5.2)
Alkaline Phosphatase: 73 U/L (ref 39–117)
BUN: 19 mg/dL (ref 6–23)
CO2: 28 mEq/L (ref 19–32)
Calcium: 10 mg/dL (ref 8.4–10.5)
Chloride: 102 mEq/L (ref 96–112)
Creatinine, Ser: 1.03 mg/dL (ref 0.40–1.20)
GFR: 54.54 mL/min — ABNORMAL LOW (ref >60.00)
Glucose, Bld: 106 mg/dL — ABNORMAL HIGH (ref 70–99)
Potassium: 4.8 mEq/L (ref 3.5–5.1)
Sodium: 139 mEq/L (ref 135–145)
Total Bilirubin: 0.7 mg/dL (ref 0.2–1.2)
Total Protein: 7.5 g/dL (ref 6.0–8.3)

## 2020-06-10 LAB — LIPID PANEL
Cholesterol: 134 mg/dL (ref 0–200)
HDL: 72.1 mg/dL (ref >39.00)
LDL Cholesterol: 44 mg/dL (ref 0–99)
NonHDL: 61.69
Total CHOL/HDL Ratio: 2
Triglycerides: 90 mg/dL (ref 0.0–149.0)
VLDL: 18 mg/dL (ref 0.0–40.0)

## 2020-06-10 LAB — CK: Total CK: 70 U/L (ref 7–177)

## 2020-06-10 LAB — MAGNESIUM: Magnesium: 2.5 mg/dL (ref 1.5–2.5)

## 2020-06-10 LAB — HEMOGLOBIN A1C: Hgb A1c MFr Bld: 6 % (ref 4.6–6.5)

## 2020-06-10 MED ORDER — DULOXETINE HCL 60 MG PO CPEP: 60.0000 mg | ORAL_CAPSULE | Freq: Every day | ORAL | 1 refills | Status: DC

## 2020-06-10 MED ORDER — BUSPIRONE HCL 10 MG PO TABS: 10.0000 mg | ORAL_TABLET | Freq: Three times a day (TID) | ORAL | 3 refills | Status: DC

## 2020-06-10 NOTE — Progress Notes (Signed)
c  Subjective:  Patient ID: Erin Aguilar, female    DOB: 10-09-1950  Age: 70 y.o. MRN: [id number]  CC: The primary encounter diagnosis was Leg cramps. Diagnoses of Prediabetes, Obesity (BMI 30.0-34.9), and Stage 3a chronic kidney disease (HCC) were also pertinent to this visit.  HPI Erin Aguilar presents for evaluation of leg cramps  This visit occurred during the SARS-CoV-2 public health emergency.  Safety protocols were in place, including screening questions prior to the visit, additional usage of staff PPE, and extensive cleaning of exam room while observing appropriate contact time as indicated for disinfecting solutions.   Waking up with leg and feet cramps.  Calves are often sore the following day . No new meds.  Started after she started a walking program to help with weight gain. Denies bruising and swelling.   Prediabetes and obesity:  Has reduced sugar intake for 4 weeks,  And walking daily , weather permitting. balance is too poor for a 2 wheeled street bicycle so she has ordered a tricycle .  Knees too stiff to mount her bike.  Has lost 6 lbs  So far by home scales.   She denies any symptoms of persistent depression /anxiety on current regimen of Lexapro, cymbalta and buspirone and is tolerating medicaitons without any significant weight gain . She is sleeping well, has a good appetite and having social outings once a week.  She has taken a healthy interest in her weight gain and is actively working on it,  her demeanor is calm.      Outpatient Medications Prior to Visit  Medication Sig Dispense Refill  . butalbital-acetaminophen-caffeine (FIORICET WITH CODEINE) 50-325-40-30 MG capsule TAKE 1 CAPSULE BY MOUTH TWICE DAILYAS NEEDED FOR PAIN 60 capsule 5  . aspirin 81 MG chewable tablet Chew 81 mg by mouth.    Marland Kitchen atorvastatin (LIPITOR) 80 MG tablet TAKE 1 TABLET BY MOUTH ONCE DAILY. 90 tablet 0  . escitalopram (LEXAPRO) 20 MG tablet TAKE 1 TABLET BY MOUTH ONCE DAILY. 90 tablet 1   . esomeprazole (NEXIUM) 40 MG capsule TAKE 1 CAPSULE IN THE MORNING BEFORE BREAKFAST. 90 capsule 1  . furosemide (LASIX) 20 MG tablet TAKE 1 TABLET BY MOUTH ONCE DAILY. 30 tablet 3  . Multiple Vitamin (MULTIVITAMIN) tablet Take 1 tablet by mouth daily.    Marland Kitchen PARoxetine (PAXIL CR) 25 MG 24 hr tablet Take 1 tablet (25 mg total) by mouth daily. 30 tablet 5  . pramipexole (MIRAPEX) 0.25 MG tablet TAKE ONE TABLET BY MOUTH AT BEDTIME. 90 tablet 0  . PREMARIN vaginal cream Place 1 Applicatorful vaginally. Twice weekly    . UNABLE TO FIND Hemp Cannabinoid extract 1/2 dropper sublingual once daily for arthritis pain.    Marland Kitchen ALPRAZolam (XANAX) 0.25 MG tablet TAKE 1 TABLET AT BEDTIME AS NEEDED FOR ANXIETY (Patient not taking: No sig reported) 30 tablet 5  . busPIRone (BUSPAR) 10 MG tablet TAKE (1) TABLET BY MOUTH THREE TIMES DAILY 90 tablet 3  . DULoxetine (CYMBALTA) 60 MG capsule TAKE (1) CAPSULE BY MOUTH ONCE DAILY. 90 capsule 0  . promethazine (PHENERGAN) 25 MG tablet TAKE 1 TABLET BY MOUTH EVERY 6 HOURS AS NEEDED FOR NAUSEA & VOMITING (Patient not taking: Reported on 06/10/2020) 30 tablet 0  . promethazine (PHENERGAN) 25 MG tablet TAKE 1 TABLET BY MOUTH EVERY 6 HOURS AS NEEDED FOR NAUSEA & VOMITING (Patient not taking: Reported on 06/10/2020) 30 tablet 0   Facility-Administered Medications Prior to Visit  Medication Dose Route Frequency  Provider Last Rate Last Admin  . 0.9 %  sodium chloride infusion  500 mL Intravenous Continuous Sherrilyn Rist, MD        Review of Systems;  Patient denies headache, fevers, malaise, unintentional weight loss, skin rash, eye pain, sinus congestion and sinus pain, sore throat, dysphagia,  hemoptysis , cough, dyspnea, wheezing, chest pain, palpitations, orthopnea, edema, abdominal pain, nausea, melena, diarrhea, constipation, flank pain, dysuria, hematuria, urinary  Frequency, nocturia, numbness, tingling, seizures,  Focal weakness, Loss of consciousness,  Tremor,  insomnia, depression, anxiety, and suicidal ideation.      Objective:  BP 124/88 (BP Location: Left Arm, Patient Position: Sitting)   Pulse 70   Temp (!) 97.5 F (36.4 C)   Ht 5' 2.99" (1.6 m)   Wt 184 lb 12.8 oz (83.8 kg)   SpO2 96%   BMI 32.74 kg/m   BP Readings from Last 3 Encounters:  06/10/20 124/88  12/09/19 102/72  10/22/19 118/76    Wt Readings from Last 3 Encounters:  06/10/20 184 lb 12.8 oz (83.8 kg)  01/08/20 190 lb (86.2 kg)  12/09/19 190 lb 12.8 oz (86.5 kg)    General appearance: alert, cooperative and appears stated age Ears: normal TM's and external ear canals both ears Throat: lips, mucosa, and tongue normal; teeth and gums normal Neck: no adenopathy, no carotid bruit, supple, symmetrical, trachea midline and thyroid not enlarged, symmetric, no tenderness/mass/nodules Back: symmetric, no curvature. ROM normal. No CVA tenderness. Lungs: clear to auscultation bilaterally Heart: regular rate and rhythm, S1, S2 normal, no murmur, click, rub or gallop Abdomen: soft, non-tender; bowel sounds normal; no masses,  no organomegaly Pulses: 2+ and symmetric Skin: Skin color, texture, turgor normal. No rashes or lesions Lymph nodes: Cervical, supraclavicular, and axillary nodes normal. Ext:  No masses, bruising  or swelling.  Negative Homan's sign bilaterally  Lab Results  Component Value Date   HGBA1C 6.0 06/10/2020   HGBA1C 6.0 10/22/2019   HGBA1C 5.8 (A) 11/28/2018    Lab Results  Component Value Date   CREATININE 1.03 06/10/2020   CREATININE 1.04 10/22/2019   CREATININE 0.83 09/19/2018    Lab Results  Component Value Date   WBC 5.2 10/22/2019   HGB 13.4 10/22/2019   HCT 39.4 10/22/2019   PLT 210.0 10/22/2019   GLUCOSE 106 (H) 06/10/2020   CHOL 134 06/10/2020   TRIG 90.0 06/10/2020   HDL 72.10 06/10/2020   LDLDIRECT 106.0 06/10/2012   LDLCALC 44 06/10/2020   ALT 21 06/10/2020   AST 28 06/10/2020   NA 139 06/10/2020   K 4.8 06/10/2020   CL  102 06/10/2020   CREATININE 1.03 06/10/2020   BUN 19 06/10/2020   CO2 28 06/10/2020   TSH 2.31 06/10/2020   HGBA1C 6.0 06/10/2020    MM 3D SCREEN BREAST BILATERAL  Result Date: 12/25/2019 CLINICAL DATA:  Screening. EXAM: DIGITAL SCREENING BILATERAL MAMMOGRAM WITH TOMO AND CAD COMPARISON:  Previous exam(s). ACR Breast Density Category b: There are scattered areas of fibroglandular density. FINDINGS: There are no findings suspicious for malignancy. Images were processed with CAD. IMPRESSION: No mammographic evidence of malignancy. A result letter of this screening mammogram will be mailed directly to the patient. RECOMMENDATION: Screening mammogram in one year. (Code:SM-B-01Y) BI-RADS CATEGORY  1: Negative. Electronically Signed   By: Meda Klinefelter MD   On: 12/25/2019 16:35    Assessment & Plan:   Problem List Items Addressed This Visit      Unprioritized   Prediabetes  Relevant Orders   Lipid panel (Completed)   Hemoglobin A1c (Completed)   Obesity (BMI 30.0-34.9)    I have congratulated her in loss of 6 lbs and encouraged  Continued weight loss with goal of 10% of body weigh over the next 6 months using a low glycemic index diet and regular exercise a minimum of 5 days per week.        Leg cramps - Primary    Distal pulses excellent.  So sign of lyte imbalance,  Muscle damage ,  Thyroid,  Signs of dehydration.  Likely due to  muscle fatigue from ne walking program.  Advised to try drinking an ounce of pickle juice (dill) for the extra salt   Lab Results  Component Value Date   CKTOTAL 70 06/10/2020   Lab Results  Component Value Date   TSH 2.31 06/10/2020   Lab Results  Component Value Date   NA 139 06/10/2020   K 4.8 06/10/2020   CL 102 06/10/2020   CO2 28 06/10/2020         Relevant Orders   CK (Creatine Kinase) (Completed)   TSH (Completed)   Magnesium (Completed)   Comprehensive metabolic panel (Completed)   CKD (chronic kidney disease) stage 3, GFR 30-59  ml/min (HCC)    GFR has been < 60 ml/min for the past 6 months;  Will advise her to suspend the furosemide for 2 weeks and recheck       Relevant Orders   Renal function panel      I have discontinued Erin Aguilar ALPRAZolam, promethazine, and promethazine. I have also changed her busPIRone and DULoxetine. Additionally, I am having her maintain her multivitamin, UNABLE TO FIND, aspirin, Premarin, PARoxetine, butalbital-acetaminophen-caffeine, pramipexole, escitalopram, esomeprazole, atorvastatin, and furosemide. We will continue to administer sodium chloride.  Meds ordered this encounter  Medications  . busPIRone (BUSPAR) 10 MG tablet    Sig: Take 1 tablet (10 mg total) by mouth 3 (three) times daily.    Dispense:  270 tablet    Refill:  3  . DULoxetine (CYMBALTA) 60 MG capsule    Sig: Take 1 capsule (60 mg total) by mouth daily.    Dispense:  90 capsule    Refill:  1    Medications Discontinued During This Encounter  Medication Reason  . ALPRAZolam (XANAX) 0.25 MG tablet Error  . promethazine (PHENERGAN) 25 MG tablet Error  . promethazine (PHENERGAN) 25 MG tablet Error  . busPIRone (BUSPAR) 10 MG tablet Reorder  . DULoxetine (CYMBALTA) 60 MG capsule Reorder    Follow-up: No follow-ups on file.   Sherlene Shams, MD

## 2020-06-10 NOTE — Patient Instructions (Signed)
Try this remedy for leg cramps  1 ounce of dill pickle juice daily (BEFORE THE CRAMPS  NOT AFTER)   Leg Cramps Leg cramps occur when one or more muscles tighten and a person has no control over it (involuntary muscle contraction). Muscle cramps are most common in the calf muscles of the leg. They can occur during exercise or at rest. Leg cramps are painful, and they may last for a few seconds to a few minutes. Cramps may return several times before they finally stop. Usually, leg cramps are not caused by a serious medical problem. In many cases, the cause is not known. Some common causes include:  Excessive physical effort (overexertion), such as during intense exercise.  Doing the same motion over and over.  Staying in a certain position for a long period of time.  Improper preparation, form, or technique while doing a sport or an activity.  Dehydration.  Injury.  Side effects of certain medicines.  Abnormally low levels of minerals in your blood (electrolytes), especially potassium and calcium. This could result from: ? Pregnancy. ? Taking diuretic medicines. Follow these instructions at home: Eating and drinking  Drink enough fluid to keep your urine pale yellow. Staying hydrated may help prevent cramps.  Eat a healthy diet that includes plenty of nutrients to help your muscles function. A healthy diet includes fruits and vegetables, lean protein, whole grains, and low-fat or nonfat dairy products. Managing pain, stiffness, and swelling  Try massaging, stretching, and relaxing the affected muscle. Do this for several minutes at a time.  If directed, put ice on areas that are sore or painful after a cramp. To do this: ? Put ice in a plastic bag. ? Place a towel between your skin and the bag. ? Leave the ice on for 20 minutes, 2-3 times a day. ? Remove the ice if your skin turns bright red. This is very important. If you cannot feel pain, heat, or cold, you have a greater  risk of damage to the area.  If directed, apply heat to muscles that are tense or tight. Do this before you exercise, or as often as told by your health care provider. Use the heat source that your health care provider recommends, such as a moist heat pack or a heating pad. To do this: ? Place a towel between your skin and the heat source. ? Leave the heat on for 20-30 minutes. ? Remove the heat if your skin turns bright red. This is especially important if you are unable to feel pain, heat, or cold. You may have a greater risk of getting burned.  Try taking hot showers or baths to help relax tight muscles.      General instructions  If you are having frequent leg cramps, avoid intense exercise for several days.  Take over-the-counter and prescription medicines only as told by your health care provider.  Keep all follow-up visits. This is important. Contact a health care provider if:  Your leg cramps get more severe or more frequent, or they do not improve over time.  Your foot becomes cold, numb, or blue. Summary  Muscle cramps can develop in any muscle, but the most common place is in the calf muscles of the leg.  Leg cramps are painful, and they may last for a few seconds to a few minutes.  Usually, leg cramps are not caused by a serious medical problem. Often, the cause is not known.  Stay hydrated, and take over-the-counter and prescription  medicines only as told by your health care provider. This information is not intended to replace advice given to you by your health care provider. Make sure you discuss any questions you have with your health care provider. Document Revised: 08/20/2019 Document Reviewed: 08/20/2019 Elsevier Patient Education  2021 [employer].

## 2020-06-10 NOTE — Assessment & Plan Note (Addendum)
Distal pulses excellent.  So sign of lyte imbalance,  Muscle damage ,  Thyroid,  Signs of dehydration.  Likely due to  muscle fatigue from ne walking program.  Advised to try drinking an ounce of pickle juice (dill) for the extra salt   Lab Results  Component Value Date   CKTOTAL 70 06/10/2020   Lab Results  Component Value Date   TSH 2.31 06/10/2020   Lab Results  Component Value Date   NA 139 06/10/2020   K 4.8 06/10/2020   CL 102 06/10/2020   CO2 28 06/10/2020

## 2020-06-12 DIAGNOSIS — N183 Chronic kidney disease, stage 3 unspecified: Secondary | ICD-10-CM | POA: Insufficient documentation

## 2020-06-12 DIAGNOSIS — R944 Abnormal results of kidney function studies: Secondary | ICD-10-CM | POA: Insufficient documentation

## 2020-06-12 NOTE — Assessment & Plan Note (Signed)
I have congratulated her in loss of 6 lbs and encouraged  Continued weight loss with goal of 10% of body weigh over the next 6 months using a low glycemic index diet and regular exercise a minimum of 5 days per week.

## 2020-06-12 NOTE — Assessment & Plan Note (Signed)
GFR has been < 60 ml/min for the past 6 months;  Will advise her to suspend the furosemide for 2 weeks and recheck

## 2020-06-18 ENCOUNTER — Telehealth: Payer: Self-pay | Admitting: Internal Medicine

## 2020-06-18 NOTE — Telephone Encounter (Signed)
Patient returned Jessic's phone call.

## 2020-06-18 NOTE — Telephone Encounter (Signed)
See result note message 

## 2020-07-02 ENCOUNTER — Other Ambulatory Visit: Payer: Self-pay

## 2020-07-02 ENCOUNTER — Other Ambulatory Visit (INDEPENDENT_AMBULATORY_CARE_PROVIDER_SITE_OTHER): Payer: Medicare PPO

## 2020-07-02 DIAGNOSIS — N1831 Chronic kidney disease, stage 3a: Secondary | ICD-10-CM

## 2020-07-02 LAB — RENAL FUNCTION PANEL
Albumin: 4.1 g/dL (ref 3.5–5.2)
BUN: 16 mg/dL (ref 6–23)
CO2: 27 mEq/L (ref 19–32)
Calcium: 9.1 mg/dL (ref 8.4–10.5)
Chloride: 104 mEq/L (ref 96–112)
Creatinine, Ser: 0.85 mg/dL (ref 0.40–1.20)
GFR: 68.65 mL/min (ref >60.00)
Glucose, Bld: 100 mg/dL — ABNORMAL HIGH (ref 70–99)
Phosphorus: 4.3 mg/dL (ref 2.3–4.6)
Potassium: 4.1 mEq/L (ref 3.5–5.1)
Sodium: 138 mEq/L (ref 135–145)

## 2020-08-02 DIAGNOSIS — H34831 Tributary (branch) retinal vein occlusion, right eye, with macular edema: Secondary | ICD-10-CM | POA: Diagnosis not present

## 2020-08-26 ENCOUNTER — Other Ambulatory Visit: Payer: Self-pay | Admitting: Internal Medicine

## 2020-09-02 ENCOUNTER — Other Ambulatory Visit: Payer: Self-pay | Admitting: Internal Medicine

## 2020-09-13 ENCOUNTER — Other Ambulatory Visit: Payer: Self-pay | Admitting: Internal Medicine

## 2020-10-13 ENCOUNTER — Other Ambulatory Visit: Payer: Self-pay | Admitting: Internal Medicine

## 2020-10-20 DIAGNOSIS — H34831 Tributary (branch) retinal vein occlusion, right eye, with macular edema: Secondary | ICD-10-CM | POA: Diagnosis not present

## 2020-10-27 ENCOUNTER — Other Ambulatory Visit (HOSPITAL_COMMUNITY): Payer: Self-pay | Admitting: Ophthalmology

## 2020-10-27 ENCOUNTER — Other Ambulatory Visit: Payer: Self-pay | Admitting: Ophthalmology

## 2020-10-27 DIAGNOSIS — H4921 Sixth [abducent] nerve palsy, right eye: Secondary | ICD-10-CM

## 2020-11-01 ENCOUNTER — Telehealth: Payer: Self-pay | Admitting: Internal Medicine

## 2020-11-01 NOTE — Telephone Encounter (Signed)
Called and spoke with Erin Aguilar. Erin Aguilar states that she did not schedule for a mychart virtual visit. She declines me assisting and walking her through the steps to connect and schedule a mychart virtual visit. Erin Aguilar again declines going to the urgent care and states that she feels fine and will stay home and take tylenol and drink water. She will go to the urgent care if she begins to feel any worse.

## 2020-11-01 NOTE — Telephone Encounter (Signed)
FYI

## 2020-11-01 NOTE — Telephone Encounter (Signed)
Transfer to Access Nurse [place]  PT called to state they tested positive for covid today and wanted some advise. PT has a dull headache, sore throat and coughing. They dont feel good at all they stated/ Transfer to Access Nurse advise no apts today.

## 2020-11-01 NOTE — Telephone Encounter (Signed)
FYI [place]  I dont see where was seen virtually  Please advise that Paxlovid is time sensitive ( within 5 days of symptom start) and for her safety if unable to log on virtually she would need to be seen at urgent care. I recommend [facility] or [facility] in [place] ( see what is closest for her)

## 2020-11-01 NOTE — Telephone Encounter (Signed)
Pt called into access nurse stating that she tested positive for covid on 11/01/20. Pt c/o sore throat, headache, and coughing. She is requesting Paxlovid. Spoke to [place] and she states that she is feeling Fair. Erin Aguilar declines going to an urgent care to be evaluated as she does not feel like going. I informed her that Dr. Darrick Huntsman would want her to be evaluated for her current symptoms and gave instructions on the Mychart urgent care virtual visits. Erin Aguilar verbalized understanding and states that she will go through her mychart and set up a virtual visit. Mychart sign up has been texted to [place] through [employer].

## 2020-11-03 ENCOUNTER — Ambulatory Visit: Payer: Medicare PPO

## 2020-11-16 ENCOUNTER — Other Ambulatory Visit: Payer: Self-pay

## 2020-11-16 ENCOUNTER — Ambulatory Visit
Admission: RE | Admit: 2020-11-16 | Discharge: 2020-11-16 | Disposition: A | Discharge status: Home / Self Care | Payer: Medicare PPO | Source: Ambulatory Visit | Attending: Ophthalmology | Admitting: Ophthalmology

## 2020-11-16 DIAGNOSIS — H4921 Sixth [abducent] nerve palsy, right eye: Secondary | ICD-10-CM | POA: Insufficient documentation

## 2020-11-16 DIAGNOSIS — H532 Diplopia: Secondary | ICD-10-CM | POA: Diagnosis not present

## 2020-11-16 MED ORDER — GADOBUTROL 1 MMOL/ML IV SOLN
8.0000 mL | Freq: Once | INTRAVENOUS | Status: AC | PRN
  Administered 2020-11-16: 8 mL via INTRAVENOUS

## 2020-11-29 ENCOUNTER — Other Ambulatory Visit: Payer: Self-pay | Admitting: Internal Medicine

## 2020-11-29 NOTE — Telephone Encounter (Signed)
Refilled: 04/29/2020 Last OV: 06/10/2020 Next OV: 12/09/2020

## 2020-11-30 NOTE — Telephone Encounter (Signed)
approved

## 2020-12-05 ENCOUNTER — Other Ambulatory Visit: Payer: Self-pay | Admitting: Internal Medicine

## 2020-12-09 ENCOUNTER — Other Ambulatory Visit: Payer: Self-pay

## 2020-12-09 ENCOUNTER — Encounter: Payer: Self-pay | Admitting: Internal Medicine

## 2020-12-09 ENCOUNTER — Ambulatory Visit: Payer: Medicare PPO | Admitting: Internal Medicine

## 2020-12-09 VITALS — BP 116/72 | HR 77 | Temp 96.3°F | Resp 14 | Ht 62.0 in | Wt 186.8 lb

## 2020-12-09 DIAGNOSIS — N1831 Chronic kidney disease, stage 3a: Secondary | ICD-10-CM

## 2020-12-09 DIAGNOSIS — Z8673 Personal history of transient ischemic attack (TIA), and cerebral infarction without residual deficits: Secondary | ICD-10-CM | POA: Diagnosis not present

## 2020-12-09 DIAGNOSIS — Z1231 Encounter for screening mammogram for malignant neoplasm of breast: Secondary | ICD-10-CM | POA: Diagnosis not present

## 2020-12-09 DIAGNOSIS — E7849 Other hyperlipidemia: Secondary | ICD-10-CM

## 2020-12-09 DIAGNOSIS — R944 Abnormal results of kidney function studies: Secondary | ICD-10-CM

## 2020-12-09 DIAGNOSIS — R7303 Prediabetes: Secondary | ICD-10-CM | POA: Diagnosis not present

## 2020-12-09 DIAGNOSIS — E538 Deficiency of other specified B group vitamins: Secondary | ICD-10-CM | POA: Diagnosis not present

## 2020-12-09 DIAGNOSIS — I700 Atherosclerosis of aorta: Secondary | ICD-10-CM | POA: Diagnosis not present

## 2020-12-09 DIAGNOSIS — H4920 Sixth [abducent] nerve palsy, unspecified eye: Secondary | ICD-10-CM

## 2020-12-09 DIAGNOSIS — F32A Depression, unspecified: Secondary | ICD-10-CM | POA: Diagnosis not present

## 2020-12-09 DIAGNOSIS — R5383 Other fatigue: Secondary | ICD-10-CM

## 2020-12-09 LAB — COMPREHENSIVE METABOLIC PANEL
ALT: 24 U/L (ref 0–35)
AST: 30 U/L (ref 0–37)
Albumin: 4.1 g/dL (ref 3.5–5.2)
Alkaline Phosphatase: 98 U/L (ref 39–117)
BUN: 13 mg/dL (ref 6–23)
CO2: 26 mEq/L (ref 19–32)
Calcium: 9.3 mg/dL (ref 8.4–10.5)
Chloride: 103 mEq/L (ref 96–112)
Creatinine, Ser: 0.93 mg/dL (ref 0.40–1.20)
GFR: 61.44 mL/min (ref >60.00)
Glucose, Bld: 100 mg/dL — ABNORMAL HIGH (ref 70–99)
Potassium: 4.6 mEq/L (ref 3.5–5.1)
Sodium: 137 mEq/L (ref 135–145)
Total Bilirubin: 0.4 mg/dL (ref 0.2–1.2)
Total Protein: 7.2 g/dL (ref 6.0–8.3)

## 2020-12-09 LAB — LIPID PANEL
Cholesterol: 146 mg/dL (ref 0–200)
HDL: 74.7 mg/dL (ref >39.00)
LDL Cholesterol: 49 mg/dL (ref 0–99)
NonHDL: 71.63
Total CHOL/HDL Ratio: 2
Triglycerides: 113 mg/dL (ref 0.0–149.0)
VLDL: 22.6 mg/dL (ref 0.0–40.0)

## 2020-12-09 LAB — MICROALBUMIN / CREATININE URINE RATIO
Creatinine,U: 157.6 mg/dL
Microalb Creat Ratio: 0.5 mg/g (ref 0.0–30.0)
Microalb, Ur: 0.8 mg/dL (ref 0.0–1.9)

## 2020-12-09 LAB — HEMOGLOBIN A1C: Hgb A1c MFr Bld: 6.3 % (ref 4.6–6.5)

## 2020-12-09 NOTE — Progress Notes (Signed)
Subjective:  Patient ID: Erin Aguilar, female    DOB: Mar 14, 1951  Age: 70 y.o. MRN: [id number]  CC: The primary encounter diagnosis was B12 deficiency. Diagnoses of History of CVA (cerebrovascular accident) without residual deficits, Stage 3a chronic kidney disease (HCC), Prediabetes, Other hyperlipidemia, Breast cancer screening by mammogram, Atherosclerosis of aorta (HCC), Decreased glomerular filtration rate (GFR), Fatigue due to depression, and 6th nerve palsy, unspecified laterality were also pertinent to this visit.  HPI YARIXA LIGHTCAP presents for FOLLOW UP ON CHRONIC ISSUES   This visit occurred during the SARS-CoV-2 public health emergency.  Safety protocols were in place, including screening questions prior to the visit, additional usage of staff PPE, and extensive cleaning of exam room while observing appropriate contact time as indicated for disinfecting solutions.   MRI BRAIN DONE AUGUST 2 FOR 6TH NERVE PALSY OF RIGHT EYE ORDERED BY [name]. HAS NOT RECEIVED THE RESULTS.  Presented with double vision noted while driving.  Saw [name] same day .  Symptoms have resolved.  Had occurred one month prior while returning from a beach vacation.   1. No recent insult or specific cause for symptoms.  2. Chronic small vessel ischemia in the hemispheric white matter and  pons. Small remote left parietal cortex infarct.  3. Right sphenoid sinusitis.  Reviewed with patient today .  Changed "TIA " diagnosis from Nov 2018 to history of CVA    Had COVID July 22, husband too.  symptoms resolved and were followed by a pruritic rash on trunk and both arms  now resolving   Depression:  chief symptoms is fatigue.  Reviewed meds:  taking  paxil.,  cymbalta and lexapro    Outpatient Medications Prior to Visit  Medication Sig Dispense Refill   aspirin 81 MG chewable tablet Chew 81 mg by mouth.     atorvastatin (LIPITOR) 80 MG tablet TAKE 1 TABLET BY MOUTH ONCE DAILY. 90 tablet 0   busPIRone  (BUSPAR) 10 MG tablet Take 1 tablet (10 mg total) by mouth 3 (three) times daily. 270 tablet 3   butalbital-acetaminophen-caffeine (FIORICET WITH CODEINE) 50-325-40-30 MG capsule TAKE 1 CAPSULE BY MOUTH TWICE DAILY AS NEEDED FOR PAIN 60 capsule 0   DULoxetine (CYMBALTA) 60 MG capsule Take 1 capsule (60 mg total) by mouth daily. 90 capsule 1   escitalopram (LEXAPRO) 20 MG tablet TAKE 1 TABLET BY MOUTH ONCE DAILY. 90 tablet 1   esomeprazole (NEXIUM) 40 MG capsule TAKE 1 CAPSULE IN THE MORNING BEFORE BREAKFAST. 90 capsule 1   Multiple Vitamin (MULTIVITAMIN) tablet Take 1 tablet by mouth daily.     pramipexole (MIRAPEX) 0.25 MG tablet TAKE ONE TABLET BY MOUTH AT BEDTIME. 90 tablet 0   PREMARIN vaginal cream Place 1 Applicatorful vaginally. Twice weekly     UNABLE TO FIND Hemp Cannabinoid extract 1/2 dropper sublingual once daily for arthritis pain.     PARoxetine (PAXIL-CR) 25 MG 24 hr tablet TAKE 1 TABLET BY MOUTH ONCE DAILY. 30 tablet 0   furosemide (LASIX) 20 MG tablet TAKE 1 TABLET BY MOUTH ONCE DAILY. (Patient not taking: Reported on 12/09/2020) 30 tablet 3   Facility-Administered Medications Prior to Visit  Medication Dose Route Frequency Provider Last Rate Last Admin   0.9 %  sodium chloride infusion  500 mL Intravenous Continuous Danis, Andreas Blower, MD        Review of Systems;  Patient denies headache, fevers, , unintentional weight loss, skin rash, eye pain, sinus congestion and sinus pain, sore throat,  dysphagia,  hemoptysis , cough, dyspnea, wheezing, chest pain, palpitations, orthopnea, edema, abdominal pain, nausea, melena, diarrhea, constipation, flank pain, dysuria, hematuria, urinary  Frequency, nocturia, numbness, tingling, seizures,  Focal weakness, Loss of consciousness,  Tremor, insomnia,  and suicidal ideation.      Objective:  BP 116/72 (BP Location: Left Arm, Patient Position: Sitting, Cuff Size: Large)   Pulse 77   Temp (!) 96.3 F (35.7 C) (Temporal)   Resp 14   Ht  5\' 2"  (1.575 m)   Wt 186 lb 12.8 oz (84.7 kg)   SpO2 96%   BMI 34.17 kg/m   BP Readings from Last 3 Encounters:  12/09/20 116/72  06/10/20 124/88  12/09/19 102/72    Wt Readings from Last 3 Encounters:  12/09/20 186 lb 12.8 oz (84.7 kg)  06/10/20 184 lb 12.8 oz (83.8 kg)  01/08/20 190 lb (86.2 kg)    General appearance: alert, cooperative and appears stated age Ears: normal TM's and external ear canals both ears Eyes:  still seeing double with right lateral deviation of eyes  Throat: lips, mucosa, and tongue normal; teeth and gums normal Neck: no adenopathy, no carotid bruit, supple, symmetrical, trachea midline and thyroid not enlarged, symmetric, no tenderness/mass/nodules Back: symmetric, no curvature. ROM normal. No CVA tenderness. Lungs: clear to auscultation bilaterally Heart: regular rate and rhythm, S1, S2 normal, no murmur, click, rub or gallop Abdomen: soft, non-tender; bowel sounds normal; no masses,  no organomegaly Pulses: 2+ and symmetric Skin: Skin color, texture, turgor normal. No rashes or lesions Lymph nodes: Cervical, supraclavicular, and axillary nodes normal.  Lab Results  Component Value Date   HGBA1C 6.3 12/09/2020   HGBA1C 6.0 06/10/2020   HGBA1C 6.0 10/22/2019    Lab Results  Component Value Date   CREATININE 0.93 12/09/2020   CREATININE 0.85 07/02/2020   CREATININE 1.03 06/10/2020    Lab Results  Component Value Date   WBC 5.2 10/22/2019   HGB 13.4 10/22/2019   HCT 39.4 10/22/2019   PLT 210.0 10/22/2019   GLUCOSE 100 (H) 12/09/2020   CHOL 146 12/09/2020   TRIG 113.0 12/09/2020   HDL 74.70 12/09/2020   LDLDIRECT 106.0 06/10/2012   LDLCALC 49 12/09/2020   ALT 24 12/09/2020   AST 30 12/09/2020   NA 137 12/09/2020   K 4.6 12/09/2020   CL 103 12/09/2020   CREATININE 0.93 12/09/2020   BUN 13 12/09/2020   CO2 26 12/09/2020   TSH 2.31 06/10/2020   HGBA1C 6.3 12/09/2020   MICROALBUR 0.8 12/09/2020    MR BRAIN W WO  CONTRAST  Result Date: 11/17/2020 CLINICAL DATA:  Two episodes of double vision in the left eye over the past 6 weeks. EXAM: MRI HEAD WITHOUT AND WITH CONTRAST TECHNIQUE: Multiplanar, multiecho pulse sequences of the brain and surrounding structures were obtained without and with intravenous contrast. CONTRAST:  62mL GADAVIST GADOBUTROL 1 MMOL/ML IV SOLN COMPARISON:  None. FINDINGS: Brain: No acute or subacute infarction, hemorrhage, hydrocephalus, extra-axial collection or mass lesion. Chronic small vessel ischemia in the pons and cerebral white matter. Small remote left parietal cortex infarct. No cranial nerve findings. Unremarkable skull base and cavernous sinus region. Vascular: Preserved flow voids and vascular enhancements. No evidence of causative aneurysm. Skull and upper cervical spine: Normal marrow signal Sinuses/Orbits: Mucosal thickening in the right sphenoid sinus which is moderate. Negative orbits. Partial right mastoid opacification with negative nasopharynx. No invasive/complicated features seen at adjacent structures. IMPRESSION: 1. No recent insult or specific cause for symptoms. 2.  Chronic small vessel ischemia in the hemispheric white matter and pons. Small remote left parietal cortex infarct. 3. Right sphenoid sinusitis. Electronically Signed   By: Marnee Spring M.D.   On: 11/17/2020 09:38    Assessment & Plan:   Problem List Items Addressed This Visit       Unprioritized   6th nerve palsy, unspecified laterality    Reviewed MRI ordered by Dr Druscilla Brownie but not yet reviewed with patient.  No acute ischemic changes or masses.  She continues to have diplopia with rightward gaze.        Atherosclerosis of aorta [facility])    Reviewed CT  Today with patient   An Incidental finding on June 2020 abdominal CT . However, her "TIA" from 2019 actulaly left ischemic changes non her brain MRI.  Continue statin with goal  LDL  < 70 on atorvastatin.  No changes today   Lab Results  Component  Value Date   CHOL 146 12/09/2020   HDL 74.70 12/09/2020   LDLCALC 49 12/09/2020   LDLDIRECT 106.0 06/10/2012   TRIG 113.0 12/09/2020   CHOLHDL 2 12/09/2020         B12 deficiency - Primary   Decreased glomerular filtration rate (GFR)    Resolved with dc furosemide .  Lab Results  Component Value Date   CREATININE 0.93 12/09/2020   Lab Results  Component Value Date   NA 137 12/09/2020   K 4.6 12/09/2020   CL 103 12/09/2020   CO2 26 12/09/2020         Fatigue due to depression    stopping paxil .  Continue cymbalta and lexapro       History of CVA (cerebrovascular accident) without residual deficits    Remote left parietal infarct was noted on MRI in 2019 when she presented with TIA. Marland Kitchen  Continue statin and asa       Prediabetes   Relevant Orders   Hemoglobin A1c (Completed)   Lipid panel (Completed)   Other Visit Diagnoses     Other hyperlipidemia       Relevant Orders   Lipid panel (Completed)   Breast cancer screening by mammogram       Relevant Orders   MM 3D SCREEN BREAST BILATERAL       I spent 30 minutes dedicated to the care of this patient on the date of this encounter to include pre-visit review of her previous imaging studies,  most recent MRI ,  Face-to-face time with the patient , and post visit ordering of testing and therapeutics.   Medications Discontinued During This Encounter  Medication Reason   furosemide (LASIX) 20 MG tablet    PARoxetine (PAXIL-CR) 25 MG 24 hr tablet     Follow-up: No follow-ups on file.   Sherlene Shams, MD

## 2020-12-09 NOTE — Patient Instructions (Signed)
Your MRI suggests no cause for the vision changes  The sinus inflammation is likely residual from your COVID infection and does not need treatment   Your "TIA" in Nov 2018 was actually a mild stroke.  You are on the appropriate medications to prevent future strokes (aspirin and lipitor)   PLEASE SUSPEND THE PAXIL.  CONTINUE LEXAPRO AND CYMBALTA

## 2020-12-12 DIAGNOSIS — H4920 Sixth [abducent] nerve palsy, unspecified eye: Secondary | ICD-10-CM | POA: Insufficient documentation

## 2020-12-12 NOTE — Assessment & Plan Note (Addendum)
Resolved with dc furosemide .  Lab Results  Component Value Date   CREATININE 0.93 12/09/2020   Lab Results  Component Value Date   NA 137 12/09/2020   K 4.6 12/09/2020   CL 103 12/09/2020   CO2 26 12/09/2020

## 2020-12-12 NOTE — Assessment & Plan Note (Addendum)
Reviewed CT  Today with patient   An Incidental finding on June 2020 abdominal CT . However, her "TIA" from 2019 actulaly left ischemic changes non her brain MRI.  Continue statin with goal  LDL  < 70 on atorvastatin.  No changes today   Lab Results  Component Value Date   CHOL 146 12/09/2020   HDL 74.70 12/09/2020   LDLCALC 49 12/09/2020   LDLDIRECT 106.0 06/10/2012   TRIG 113.0 12/09/2020   CHOLHDL 2 12/09/2020

## 2020-12-12 NOTE — Assessment & Plan Note (Addendum)
Reviewed MRI ordered by Dr Druscilla Brownie but not yet reviewed with patient.  No acute ischemic changes or masses.  She continues to have diplopia with rightward gaze.

## 2020-12-12 NOTE — Assessment & Plan Note (Signed)
stopping paxil .  Continue cymbalta and lexapro

## 2020-12-12 NOTE — Assessment & Plan Note (Signed)
Remote left parietal infarct was noted on MRI in 2019 when she presented with TIA. Marland Kitchen  Continue statin and asa

## 2020-12-15 DIAGNOSIS — H34831 Tributary (branch) retinal vein occlusion, right eye, with macular edema: Secondary | ICD-10-CM | POA: Diagnosis not present

## 2020-12-22 ENCOUNTER — Other Ambulatory Visit: Payer: Self-pay | Admitting: Internal Medicine

## 2021-01-10 ENCOUNTER — Ambulatory Visit (INDEPENDENT_AMBULATORY_CARE_PROVIDER_SITE_OTHER): Payer: Medicare PPO

## 2021-01-10 VITALS — Ht 62.0 in | Wt 186.0 lb

## 2021-01-10 DIAGNOSIS — Z0000 Encounter for general adult medical examination without abnormal findings: Secondary | ICD-10-CM

## 2021-01-10 NOTE — Patient Instructions (Addendum)
Erin Aguilar , Thank you for taking time to come for your Medicare Wellness Visit. I appreciate your ongoing commitment to your health goals. Please review the following plan we discussed and let me know if I can assist you in the future.   These are the goals we discussed:  Goals       Patient Stated     DIET - INCREASE WATER INTAKE (pt-stated)      Other     Healthy Lifestyle      Stay active Healthy diet        This is a list of the screening recommended for you and due dates:  Health Maintenance  Topic Date Due   Mammogram  12/23/2020   Flu Shot  01/25/2021*   COVID-19 Vaccine (4 - Booster for Moderna series) 01/26/2021*   Tetanus Vaccine  01/23/2023   Colon Cancer Screening  12/28/2025   DEXA scan (bone density measurement)  Completed   Hepatitis C Screening: USPSTF Recommendation to screen - Ages 50-79 yo.  Completed   Zoster (Shingles) Vaccine  Completed   HPV Vaccine  Aged Out  *Topic was postponed. The date shown is not the original due date.    Advanced directives: End of life planning; Advance aging; Advanced directives discussed.  Copy of current HCPOA/Living Will requested.    Conditions/risks identified: none new  Follow up in one year for your annual wellness visit    Preventive Care 65 Years and Older, Female Preventive care refers to lifestyle choices and visits with your health care provider that can promote health and wellness. What does preventive care include? A yearly physical exam. This is also called an annual well check. Dental exams once or twice a year. Routine eye exams. Ask your health care provider how often you should have your eyes checked. Personal lifestyle choices, including: Daily care of your teeth and gums. Regular physical activity. Eating a healthy diet. Avoiding tobacco and drug use. Limiting alcohol use. Practicing safe sex. Taking low-dose aspirin every day. Taking vitamin and mineral supplements as recommended by your  health care provider. What happens during an annual well check? The services and screenings done by your health care provider during your annual well check will depend on your age, overall health, lifestyle risk factors, and family history of disease. Counseling  Your health care provider may ask you questions about your: Alcohol use. Tobacco use. Drug use. Emotional well-being. Home and relationship well-being. Sexual activity. Eating habits. History of falls. Memory and ability to understand (cognition). Work and work Astronomer. Reproductive health. Screening  You may have the following tests or measurements: Height, weight, and BMI. Blood pressure. Lipid and cholesterol levels. These may be checked every 5 years, or more frequently if you are over 63 years old. Skin check. Lung cancer screening. You may have this screening every year starting at age 33 if you have a 30-pack-year history of smoking and currently smoke or have quit within the past 15 years. Fecal occult blood test (FOBT) of the stool. You may have this test every year starting at age 45. Flexible sigmoidoscopy or colonoscopy. You may have a sigmoidoscopy every 5 years or a colonoscopy every 10 years starting at age 5. Hepatitis C blood test. Hepatitis B blood test. Sexually transmitted disease (STD) testing. Diabetes screening. This is done by checking your blood sugar (glucose) after you have not eaten for a while (fasting). You may have this done every 1-3 years. Bone density scan. This is done  to screen for osteoporosis. You may have this done starting at age 3. Mammogram. This may be done every 1-2 years. Talk to your health care provider about how often you should have regular mammograms. Talk with your health care provider about your test results, treatment options, and if necessary, the need for more tests. Vaccines  Your health care provider may recommend certain vaccines, such as: Influenza vaccine.  This is recommended every year. Tetanus, diphtheria, and acellular pertussis (Tdap, Td) vaccine. You may need a Td booster every 10 years. Zoster vaccine. You may need this after age 12. Pneumococcal 13-valent conjugate (PCV13) vaccine. One dose is recommended after age 84. Pneumococcal polysaccharide (PPSV23) vaccine. One dose is recommended after age 85. Talk to your health care provider about which screenings and vaccines you need and how often you need them. This information is not intended to replace advice given to you by your health care provider. Make sure you discuss any questions you have with your health care provider. Document Released: 04/30/2015 Document Revised: 12/22/2015 Document Reviewed: 02/02/2015 Elsevier Interactive Patient Education  2017 [place] Prevention in the Home Falls can cause injuries. They can happen to people of all ages. There are many things you can do to make your home safe and to help prevent falls. What can I do on the outside of my home? Regularly fix the edges of walkways and driveways and fix any cracks. Remove anything that might make you trip as you walk through a door, such as a raised step or threshold. Trim any bushes or trees on the path to your home. Use bright outdoor lighting. Clear any walking paths of anything that might make someone trip, such as rocks or tools. Regularly check to see if handrails are loose or broken. Make sure that both sides of any steps have handrails. Any raised decks and porches should have guardrails on the edges. Have any leaves, snow, or ice cleared regularly. Use sand or salt on walking paths during winter. Clean up any spills in your garage right away. This includes oil or grease spills. What can I do in the bathroom? Use night lights. Install grab bars by the toilet and in the tub and shower. Do not use towel bars as grab bars. Use non-skid mats or decals in the tub or shower. If you need to sit  down in the shower, use a plastic, non-slip stool. Keep the floor dry. Clean up any water that spills on the floor as soon as it happens. Remove soap buildup in the tub or shower regularly. Attach bath mats securely with double-sided non-slip rug tape. Do not have throw rugs and other things on the floor that can make you trip. What can I do in the bedroom? Use night lights. Make sure that you have a light by your bed that is easy to reach. Do not use any sheets or blankets that are too big for your bed. They should not hang down onto the floor. Have a firm chair that has side arms. You can use this for support while you get dressed. Do not have throw rugs and other things on the floor that can make you trip. What can I do in the kitchen? Clean up any spills right away. Avoid walking on wet floors. Keep items that you use a lot in easy-to-reach places. If you need to reach something above you, use a strong step stool that has a grab bar. Keep electrical cords out of the  way. Do not use floor polish or wax that makes floors slippery. If you must use wax, use non-skid floor wax. Do not have throw rugs and other things on the floor that can make you trip. What can I do with my stairs? Do not leave any items on the stairs. Make sure that there are handrails on both sides of the stairs and use them. Fix handrails that are broken or loose. Make sure that handrails are as long as the stairways. Check any carpeting to make sure that it is firmly attached to the stairs. Fix any carpet that is loose or worn. Avoid having throw rugs at the top or bottom of the stairs. If you do have throw rugs, attach them to the floor with carpet tape. Make sure that you have a light switch at the top of the stairs and the bottom of the stairs. If you do not have them, ask someone to add them for you. What else can I do to help prevent falls? Wear shoes that: Do not have high heels. Have rubber bottoms. Are  comfortable and fit you well. Are closed at the toe. Do not wear sandals. If you use a stepladder: Make sure that it is fully opened. Do not climb a closed stepladder. Make sure that both sides of the stepladder are locked into place. Ask someone to hold it for you, if possible. Clearly mark and make sure that you can see: Any grab bars or handrails. First and last steps. Where the edge of each step is. Use tools that help you move around (mobility aids) if they are needed. These include: Canes. Walkers. Scooters. Crutches. Turn on the lights when you go into a dark area. Replace any light bulbs as soon as they burn out. Set up your furniture so you have a clear path. Avoid moving your furniture around. If any of your floors are uneven, fix them. If there are any pets around you, be aware of where they are. Review your medicines with your doctor. Some medicines can make you feel dizzy. This can increase your chance of falling. Ask your doctor what other things that you can do to help prevent falls. This information is not intended to replace advice given to you by your health care provider. Make sure you discuss any questions you have with your health care provider. Document Released: 01/28/2009 Document Revised: 09/09/2015 Document Reviewed: 05/08/2014 Elsevier Interactive Patient Education  2017 [employer]  Opioid Pain Medicine Management Opioid pain medicines are strong medicines that are used to treat bad or very bad pain. When you take them for a short time, they can help you: Sleep better. Do better in physical therapy. Feel better during the first few days after you get hurt. Recover from surgery. Only take these medicines if a doctor says that you can. You should only take them for a short time. This is because opioids can be very addictive. This means that they are hard to stop taking. The longer you take opioids, the harder it may be to stop taking them. What are the  risks? Opioids can cause problems (side effects). Taking them for more than 3 days raises your chance of problems, such as: Trouble pooping (constipation). Feeling sick to your stomach (nausea). Vomiting. Feeling very sleepy. Confusion. Not being able to stop taking the medicine. Breathing problems. Taking opioids for a long time can make it hard for you to do daily tasks. It can also put you at risk for:  Car accidents. Depression. Suicide. Heart attack. Taking too much of the medicine (overdose). This can lead to death. What is a pain treatment plan? A pain treatment plan is a plan made by you and your doctor. Work with your doctor to make a plan for treating your pain. To help you do this: Talk about the goals of your treatment, including: How much pain you might expect to have. How you will manage the pain. Talk about the risks and benefits of taking these medicines for your condition. Remember that a good treatment plan uses more than one approach and lowers the risks of side effects. Tell your doctor about the amount of medicines you take and about any drug or alcohol use. Get your pain medicine prescriptions from only one doctor. Pain can be managed with other treatments. Work with your doctor to find other ways to help your pain, such as: Physical therapy or doing gentle exercises. Counseling. Eating healthy foods. Massage. Meditation. Other pain medicines. How to use opioid pain medicine safely Taking medicine Take your pain medicine exactly as told by your doctor. Take it only when you need it. If your pain is not too bad, you may take less medicine if your doctor allows. If you have no pain, do not take the medicine unless your doctor tells you to take it. If your pain is very bad, do not take more medicine than your doctor told you to take. Call your doctor to know what to do. Write down the times when you take your pain medicine. Look at the times before you take your  next dose. Take other over-the-counter or prescription medicines only as told by your doctor. Keeping yourself and others safe  While you are taking opioids: Do not drive, use machines, or power tools. Do not sign important papers (legal documents). Do not drink alcohol. Do not take sleeping pills. Do not take care of children by yourself. Do not do activities where you need to climb or be in high places, like working on a ladder. Do not go to a lake, river, ocean, swimming pool, or hot tub. Keep your opioids locked up or in a place where children cannot reach them. Do not share your pain medicine with anyone. Stopping your use of opioids If you have been taking opioids for more than a few weeks, you may need to slowly decrease (taper) how much you take until you stop taking them. Doing this can lower your chance of having symptoms.  Symptoms that come from suddenly stopping the use of opioids include: Pain and cramping in your belly (abdomen). Feeling sick to your stomach (nausea).z Sweating. Feeling very sleepy. Feeling restless. Shaking you cannot control (tremors). Cravings for the medicine. Do not try to stop taking them by yourself. Work with your doctor to stop. Your doctor will help you take less until you are not taking the medicine at all. Getting rid of unused pills Do not save any pills that you did not use. Get rid of the pills by: Taking them to a take-back program in your area. Bringing them to a pharmacy that receives unused pills. Flushing them down the toilet. Check the label or package insert of your medicine to see whether this is safe to do. Throwing them in the trash. Check the label or package insert of your medicine to see whether this is safe to do. If it is safe to throw them out: Take the pills out of their container. Put the pills into  a container you can seal. Mix the pills with used coffee grounds, food scraps, dirt, or cat litter. Put this in the  trash. Follow these instructions at home: Activity Do exercises as told by your doctor. Avoid doing things that make your pain worse. Return to your normal activities as told by your doctor. Ask your doctor what activities are safe for you. General instructions You may need to take these actions to prevent or treat constipation: Drink enough fluid to keep your pee (urine) pale yellow. Take over-the-counter or prescription medicines. Eat foods that are high in fiber. These include beans, whole grains, and fresh fruits and vegetables. Limit foods that are high in fat and sugar. These include fried or sweet foods. Keep all follow-up visits. Where to find support If you have been taking opioids for a long time, get help from a local support group or counselor. Ask your doctor about this. Where to find more information Centers for Disease Control and Prevention (CDC): [employer] [place] Food and Drug Administration (FDA): PumpkinSearch.com.ee Get help right away if: You may have taken too much of an opioid (overdosed). Common symptoms of an overdose: Your breathing is slower or more shallow than normal. You have a very slow heartbeat. Your speech is not normal. You vomit or you feel as if you may vomit. The black centers of your eyes (pupils) are smaller than normal. You have other potential symptoms: You feel very confused. You faint. You are very sleepy. You have cold skin. You have blue lips or fingernails. You have thoughts of harming yourself or harming others. These symptoms may be an emergency. Get help right away. Call your local emergency services (911 in the [place] Do not wait to see if the symptoms will go away. Do not drive yourself to the hospital. Get help right away if you feel like you may hurt yourself or others, or have thoughts about taking your own life. Go to your nearest emergency room or: Call your local emergency services (911 in the [place] Call the [facility] at (925)769-3856. Call a suicide crisis helpline, such as the National Suicide Prevention Lifeline at 207-079-2349. This is open 24 hours a day. Text the Crisis Text Line at 316-847-7238. Summary Opioid are strong medicines that are used to treat bad or very bad pain. A pain treatment plan is a plan made by you and your doctor. Work with your doctor to make a plan for treating your pain. If you think that you or someone else may have taken too much of an opioid, get help right away. This information is not intended to replace advice given to you by your health care provider. Make sure you discuss any questions you have with your health care provider. Document Revised: 07/14/2020 Document Reviewed: 07/14/2020 Elsevier Patient Education  2022 [employer].

## 2021-01-10 NOTE — Progress Notes (Addendum)
Subjective:   Erin Aguilar is a 70 y.o. female who presents for Medicare Annual (Subsequent) preventive examination.  Review of Systems    No ROS.  Medicare Wellness Virtual Visit.  Visual/audio telehealth visit, UTA vital signs.   See social history for additional risk factors.   Cardiac Risk Factors include: advanced age (>59men, >4 women)     Objective:    Today's Vitals   01/10/21 1534  Weight: 186 lb (84.4 kg)  Height: 5\' 2"  (1.575 m)   Body mass index is 34.02 kg/m.  Advanced Directives 01/10/2021 01/08/2020 12/25/2018 12/10/2017 09/12/2017 12/06/2016 11/21/2015  Does Patient Have a Medical Advance Directive? Yes Yes Yes Yes Yes Yes No  Type of Advance Directive - Healthcare Power of [place];Living will Healthcare Power of [place];Living will Living will;Healthcare Power of [place] Power of [place];Living will Healthcare Power of [place];Living will -  Does patient want to make changes to medical advance directive? No - Patient declined No - Patient declined No - Patient declined No - Patient declined - No - Patient declined -  Copy of Healthcare Power of Attorney in Chart? - Yes - validated most recent copy scanned in chart (See row information) Yes - validated most recent copy scanned in chart (See row information) Yes Yes No - copy requested -    Current Medications (verified) Outpatient Encounter Medications as of 01/10/2021  Medication Sig   aspirin 81 MG chewable tablet Chew 81 mg by mouth.   atorvastatin (LIPITOR) 80 MG tablet TAKE 1 TABLET BY MOUTH ONCE DAILY.   busPIRone (BUSPAR) 10 MG tablet Take 1 tablet (10 mg total) by mouth 3 (three) times daily.   butalbital-acetaminophen-caffeine (FIORICET WITH CODEINE) 50-325-40-30 MG capsule TAKE 1 CAPSULE BY MOUTH TWICE DAILY AS NEEDED FOR PAIN   DULoxetine (CYMBALTA) 60 MG capsule Take 1 capsule (60 mg total) by mouth daily.   escitalopram (LEXAPRO) 20 MG tablet TAKE 1 TABLET BY MOUTH ONCE DAILY.    esomeprazole (NEXIUM) 40 MG capsule TAKE 1 CAPSULE IN THE MORNING BEFORE BREAKFAST.   Multiple Vitamin (MULTIVITAMIN) tablet Take 1 tablet by mouth daily.   pramipexole (MIRAPEX) 0.25 MG tablet TAKE ONE TABLET BY MOUTH AT BEDTIME.   PREMARIN vaginal cream Place 1 Applicatorful vaginally. Twice weekly   UNABLE TO FIND Hemp Cannabinoid extract 1/2 dropper sublingual once daily for arthritis pain.   Facility-Administered Encounter Medications as of 01/10/2021  Medication   0.9 %  sodium chloride infusion    Allergies (verified) Ciprofloxacin, Oxycodone, and Trazodone and nefazodone   History: Past Medical History:  Diagnosis Date   Allergy    Hay fever   Arthritis    Chicken pox    Colon polyp    Diverticulitis    GERD (gastroesophageal reflux disease)    H/O: pertussis    Headache(784.0)    Hemochromatosis carrier    sister has disease   Hemorrhoids, internal, with bleeding    occasional   Hyperlipidemia    Migraines    UTI (lower urinary tract infection)    Past Surgical History:  Procedure Laterality Date   ABDOMINAL HYSTERECTOMY  1988   CHOLECYSTECTOMY  1976   GASTRIC BYPASS  03/2010   HERNIA REPAIR  2008   ventral ,  from scar tissue, [place]   JOINT REPLACEMENT Bilateral 02/26/2014   Dr. 13/03/2014   Family History  Problem Relation Age of Onset   Arthritis Mother        rheumatoid    Cancer Mother  lymphoma   Early death Mother 19       lymphoma   Arthritis Father    Stroke Father    Cancer Sister 71       cervical ca   Kidney disease Brother    Cancer Brother        lung ca,  smoker   Arthritis Maternal Grandmother    Heart disease Maternal Grandmother    Diabetes Maternal Grandmother    Arthritis Maternal Grandfather    Heart disease Maternal Grandfather    Diabetes Maternal Grandfather    Breast cancer Neg Hx    Social History   Socioeconomic History   Marital status: Married    Spouse name: Not on file   Number of children: 3    Years of education: Not on file   Highest education level: Not on file  Occupational History   Occupation: retired  Tobacco Use   Smoking status: Former    Types: Cigarettes    Quit date: 12/08/1986    Years since quitting: 34.1   Smokeless tobacco: Never  Vaping Use   Vaping Use: Never used  Substance and Sexual Activity   Alcohol use: Yes    Alcohol/week: 3.0 standard drinks    Types: 3 Standard drinks or equivalent per week   Drug use: No   Sexual activity: Not on file  Other Topics Concern   Not on file  Social History Narrative   Not on file   Social Determinants of Health   Financial Resource Strain: Low Risk    Difficulty of Paying Living Expenses: Not hard at all  Food Insecurity: No Food Insecurity   Worried About Programme researcher, broadcasting/film/video in the Last Year: Never true   Ran Out of Food in the Last Year: Never true  Transportation Needs: No Transportation Needs   Lack of Transportation (Medical): No   Lack of Transportation (Non-Medical): No  Physical Activity: Not on file  Stress: No Stress Concern Present   Feeling of Stress : Not at all  Social Connections: Unknown   Frequency of Communication with Friends and Family: Not on file   Frequency of Social Gatherings with Friends and Family: Not on file   Attends Religious Services: Not on file   Active Member of Clubs or Organizations: Not on file   Attends Banker Meetings: Not on file   Marital Status: Married    Tobacco Counseling Counseling given: Not Answered   Clinical Intake:  Pre-visit preparation completed: Yes        Diabetes: No  How often do you need to have someone help you when you read instructions, pamphlets, or other written materials from your doctor or pharmacy?: 1 - Never    Interpreter Needed?: No      Activities of Daily Living In your present state of health, do you have any difficulty performing the following activities: 01/10/2021  Hearing? N  Vision? N   Difficulty concentrating or making decisions? N  Walking or climbing stairs? N  Dressing or bathing? N  Doing errands, shopping? N  Preparing Food and eating ? N  Using the Toilet? N  In the past six months, have you accidently leaked urine? N  Do you have problems with loss of bowel control? N  Managing your Medications? N  Managing your Finances? N  Housekeeping or managing your Housekeeping? N  Some recent data might be hidden    Patient Care Team: Sherlene Shams, MD as PCP -  General (Internal Medicine)  Indicate any recent Medical Services you may have received from other than [facility] providers in the past year (date may be approximate).     Assessment:   This is a routine wellness examination for Erin Aguilar.  I connected with Erin Aguilar today by telephone and verified that I am speaking with the correct person using two identifiers. Location patient: home Location provider: work Persons participating in the virtual visit: patient, Engineer, civil (consulting).    I discussed the limitations, risks, security and privacy concerns of performing an evaluation and management service by telephone and the availability of in person appointments. The patient expressed understanding and verbally consented to this telephonic visit.    Interactive audio and video telecommunications were attempted between this provider and patient, however failed, due to patient having technical difficulties OR patient did not have access to video capability.  We continued and completed visit with audio only.  Some vital signs may be absent or patient reported.   Hearing/Vision screen Hearing Screening - Comments:: Patient is able to hear conversational tones without difficulty.  No issues reported. Vision Screening - Comments:: Wears corrective lenses  Dietary issues and exercise activities discussed: Current Exercise Habits: Home exercise routine, Intensity: Mild Healthy diet  Good fluid intake   Goals Addressed              This Visit's Progress    Healthy Lifestyle       Stay active Healthy diet       Depression Screen PHQ 2/9 Scores 01/10/2021 12/09/2020 06/10/2020 01/08/2020 10/22/2019 12/25/2018 12/10/2017  PHQ - 2 Score 0 2 0 0 2 0 0  PHQ- 9 Score 0 5 - - 7 - -    Fall Risk Fall Risk  12/09/2020 06/10/2020 01/08/2020 12/09/2019 10/22/2019  Falls in the past year? 1 0 0 0 0  Number falls in past yr: 1 0 0 - -  Injury with Fall? 0 0 - - -  Risk for fall due to : History of fall(s) - - - -  Follow up Falls evaluation completed Falls evaluation completed Falls evaluation completed Falls evaluation completed Falls evaluation completed    FALL RISK PREVENTION PERTAINING TO THE HOME: Adequate lighting in your home to reduce risk of falls? Yes   ASSISTIVE DEVICES UTILIZED TO PREVENT FALLS: Life alert? No  Use of a cane, walker or w/c? No   TIMED UP AND GO: Was the test performed? No .   Cognitive Function: MMSE - Mini Mental State Exam 12/10/2017 12/06/2016  Orientation to time 5 5  Orientation to Place 5 5  Registration 3 3  Attention/ Calculation 5 5  Recall 3 3  Language- name 2 objects 2 -  Language- repeat 1 1  Language- follow 3 step command 3 -  Language- read & follow direction 1 1  Write a sentence 1 -  Copy design 1 1  Total score 30 -     6CIT Screen 01/10/2021 12/25/2018  What Year? 0 points 0 points  What month? 0 points 0 points  What time? 0 points 0 points  Count back from 20 0 points 0 points  Months in reverse 0 points 0 points  Repeat phrase - 0 points  Total Score - 0    Immunizations Immunization History  Administered Date(s) Administered   Hepatitis A, Adult 02/20/2013   Hepatitis B 07/24/1994, 08/23/1994, 01/23/1995   Influenza, High Dose Seasonal PF 01/12/2016, 12/06/2016   Influenza,inj,Quad PF,6+ Mos 12/14/2014   Influenza,inj,quad, With  Preservative 01/15/2018, 02/03/2019   Influenza-Unspecified 01/21/2013, 01/16/2020   Moderna Sars-Covid-2 Vaccination  04/28/2019, 05/26/2019, 02/19/2020   Pneumococcal Conjugate-13 01/22/2013   Pneumococcal Polysaccharide-23 07/27/2014   Tdap 01/22/2013   Zoster Recombinat (Shingrix) 10/31/2017, 01/03/2018   Zoster, Live 01/23/2012   Health Maintenance Health Maintenance  Topic Date Due   MAMMOGRAM  12/23/2020   INFLUENZA VACCINE  01/25/2021 (Originally 11/15/2020)   COVID-19 Vaccine (4 - Booster for Moderna series) 01/26/2021 (Originally 06/18/2020)   TETANUS/TDAP  01/23/2023   COLONOSCOPY (Pts 45-38yrs Insurance coverage will need to be confirmed)  12/28/2025   DEXA SCAN  Completed   Hepatitis C Screening  Completed   Zoster Vaccines- Shingrix  Completed   HPV VACCINES  Aged Out   Mammogram- scheduled 01/20/21  Lung Cancer Screening: (Low Dose CT Chest recommended if Age 78-80 years, 30 pack-year currently smoking OR have quit w/in 15years.) does not qualify.   Vision Screening: Recommended annual ophthalmology exams for early detection of glaucoma and other disorders of the eye.  Dental Screening: Recommended annual dental exams for proper oral hygiene  Community Resource Referral / Chronic Care Management: CRR required this visit?  No   CCM required this visit?  No      Plan:   Keep all routine maintenance appointments.   I have personally reviewed and noted the following in the patient's chart:   Medical and social history Use of alcohol, tobacco or illicit drugs  Current medications and supplements including opioid prescriptions. Currently taking Fioricet. Followed by pcp.  Functional ability and status Nutritional status Physical activity Advanced directives List of other physicians Hospitalizations, surgeries, and ER visits in previous 12 months Vitals Screenings to include cognitive, depression, and falls Referrals and appointments  In addition, I have reviewed and discussed with patient certain preventive protocols, quality metrics, and best practice recommendations. A  written personalized care plan for preventive services as well as general preventive health recommendations were provided to patient via mychart.     OBrien-Blaney, Lary Eckardt L, LPN   8/84/1660     I have reviewed the above information and agree with above.   Duncan Dull, MD

## 2021-01-18 ENCOUNTER — Other Ambulatory Visit: Payer: Self-pay | Admitting: Internal Medicine

## 2021-01-18 NOTE — Telephone Encounter (Signed)
RX Refill:FIORICET WITH CODEINE Last Seen:12-09-20 Last ordered:11-30-20

## 2021-01-20 ENCOUNTER — Other Ambulatory Visit: Payer: Self-pay

## 2021-01-20 ENCOUNTER — Ambulatory Visit
Admission: RE | Admit: 2021-01-20 | Discharge: 2021-01-20 | Disposition: A | Discharge status: Home / Self Care | Payer: Medicare PPO | Source: Ambulatory Visit | Attending: Internal Medicine | Admitting: Internal Medicine

## 2021-01-20 DIAGNOSIS — Z1231 Encounter for screening mammogram for malignant neoplasm of breast: Secondary | ICD-10-CM | POA: Insufficient documentation

## 2021-01-20 DIAGNOSIS — L578 Other skin changes due to chronic exposure to nonionizing radiation: Secondary | ICD-10-CM | POA: Diagnosis not present

## 2021-01-20 DIAGNOSIS — Z872 Personal history of diseases of the skin and subcutaneous tissue: Secondary | ICD-10-CM | POA: Diagnosis not present

## 2021-01-20 DIAGNOSIS — Z859 Personal history of malignant neoplasm, unspecified: Secondary | ICD-10-CM | POA: Diagnosis not present

## 2021-01-20 DIAGNOSIS — Z86018 Personal history of other benign neoplasm: Secondary | ICD-10-CM | POA: Diagnosis not present

## 2021-02-02 ENCOUNTER — Other Ambulatory Visit: Payer: Self-pay | Admitting: Internal Medicine

## 2021-02-09 DIAGNOSIS — H34831 Tributary (branch) retinal vein occlusion, right eye, with macular edema: Secondary | ICD-10-CM | POA: Diagnosis not present

## 2021-03-01 ENCOUNTER — Ambulatory Visit: Payer: Medicare PPO | Admitting: Adult Health

## 2021-03-01 ENCOUNTER — Encounter: Payer: Self-pay | Admitting: Adult Health

## 2021-03-01 ENCOUNTER — Other Ambulatory Visit: Payer: Self-pay

## 2021-03-01 VITALS — BP 122/84 | HR 67 | Temp 95.7°F | Ht 62.01 in | Wt 188.2 lb

## 2021-03-01 DIAGNOSIS — N3001 Acute cystitis with hematuria: Secondary | ICD-10-CM | POA: Diagnosis not present

## 2021-03-01 LAB — POCT URINALYSIS DIPSTICK
Bilirubin, UA: NEGATIVE
Glucose, UA: NEGATIVE
Ketones, UA: NEGATIVE
Nitrite, UA: NEGATIVE
Protein, UA: POSITIVE — AB
Spec Grav, UA: 1.015 (ref 1.010–1.025)
Urobilinogen, UA: 0.2 E.U./dL
pH, UA: 5.5 (ref 5.0–8.0)

## 2021-03-01 MED ORDER — CEPHALEXIN 500 MG PO CAPS: 500.0000 mg | ORAL_CAPSULE | Freq: Three times a day (TID) | ORAL | 0 refills | Status: DC

## 2021-03-01 NOTE — Addendum Note (Signed)
Addended by: Sherley Bounds on: 03/01/2021 10:42 AM   Modules accepted: Orders

## 2021-03-01 NOTE — Patient Instructions (Signed)
Urinary Tract Infection, Adult A urinary tract infection (UTI) is an infection of any part of the urinary tract. The urinary tract includes the kidneys, ureters, bladder, and urethra. These organs make, store, and get rid of urine in the body. An upper UTI affects the ureters and kidneys. A lower UTI affects the bladder and urethra. What are the causes? Most urinary tract infections are caused by bacteria in your genital area around your urethra, where urine leaves your body. These bacteria grow and cause inflammation of your urinary tract. What increases the risk? You are more likely to develop this condition if: You have a urinary catheter that stays in place. You are not able to control when you urinate or have a bowel movement (incontinence). You are female and you: Use a spermicide or diaphragm for birth control. Have low estrogen levels. Are pregnant. You have certain genes that increase your risk. You are sexually active. You take antibiotic medicines. You have a condition that causes your flow of urine to slow down, such as: An enlarged prostate, if you are female. Blockage in your urethra. A kidney stone. A nerve condition that affects your bladder control (neurogenic bladder). Not getting enough to drink, or not urinating often. You have certain medical conditions, such as: Diabetes. A weak disease-fighting system (immunesystem). Sickle cell disease. Gout. Spinal cord injury. What are the signs or symptoms? Symptoms of this condition include: Needing to urinate right away (urgency). Frequent urination. This may include small amounts of urine each time you urinate. Pain or burning with urination. Blood in the urine. Urine that smells bad or unusual. Trouble urinating. Cloudy urine. Vaginal discharge, if you are female. Pain in the abdomen or the lower back. You may also have: Vomiting or a decreased appetite. Confusion. Irritability or tiredness. A fever or  chills. Diarrhea. The first symptom in older adults may be confusion. In some cases, they may not have any symptoms until the infection has worsened. How is this diagnosed? This condition is diagnosed based on your medical history and a physical exam. You may also have other tests, including: Urine tests. Blood tests. Tests for STIs (sexually transmitted infections). If you have had more than one UTI, a cystoscopy or imaging studies may be done to determine the cause of the infections. How is this treated? Treatment for this condition includes: Antibiotic medicine. Over-the-counter medicines to treat discomfort. Drinking enough water to stay hydrated. If you have frequent infections or have other conditions such as a kidney stone, you may need to see a health care provider who specializes in the urinary tract (urologist). In rare cases, urinary tract infections can cause sepsis. Sepsis is a life-threatening condition that occurs when the body responds to an infection. Sepsis is treated in the hospital with IV antibiotics, fluids, and other medicines. Follow these instructions at home: Medicines Take over-the-counter and prescription medicines only as told by your health care provider. If you were prescribed an antibiotic medicine, take it as told by your health care provider. Do not stop using the antibiotic even if you start to feel better. General instructions Make sure you: Empty your bladder often and completely. Do not hold urine for long periods of time. Empty your bladder after sex. Wipe from front to back after urinating or having a bowel movement if you are female. Use each tissue only one time when you wipe. Drink enough fluid to keep your urine pale yellow. Keep all follow-up visits. This is important. Contact a health care provider   if: Your symptoms do not get better after 1-2 days. Your symptoms go away and then return. Get help right away if: You have severe pain in your  back or your lower abdomen. You have a fever or chills. You have nausea or vomiting. Summary A urinary tract infection (UTI) is an infection of any part of the urinary tract, which includes the kidneys, ureters, bladder, and urethra. Most urinary tract infections are caused by bacteria in your genital area. Treatment for this condition often includes antibiotic medicines. If you were prescribed an antibiotic medicine, take it as told by your health care provider. Do not stop using the antibiotic even if you start to feel better. Keep all follow-up visits. This is important. This information is not intended to replace advice given to you by your health care provider. Make sure you discuss any questions you have with your health care provider. Document Revised: 11/14/2019 Document Reviewed: 11/14/2019 Elsevier Patient Education  [place]. Cephalexin Capsules or Tablets What is this medication? CEPHALEXIN (sef a LEX in) treats infections caused by bacteria. It belongs to a group of medications called cephalosporin antibiotics. It will not treat colds, the flu, or infections caused by viruses. This medicine may be used for other purposes; ask your health care provider or pharmacist if you have questions. COMMON BRAND NAME(S): Biocef, Daxbia, Keflex, Keftab What should I tell my care team before I take this medication? They need to know if you have any of these conditions: Bleeding disorder Kidney disease Liver disease Seizures Stomach or intestine problems like colitis An unusual or allergic reaction to cephalexin, other penicillin or cephalosporin antibiotics, other medications, foods, dyes, or preservatives Pregnant or trying to get pregnant Breast-feeding How should I use this medication? Take this medication by mouth. Take it as directed on the prescription label at the same time every day. You can take it with or without food. If it upsets your stomach, take it with food. Take  all of this medication unless your care team tells you to stop it early. Keep taking it even if you think you are better. Talk to your care team about the use of this medication in children. While it may be prescribed for selected conditions, precautions do apply. Overdosage: If you think you have taken too much of this medicine contact a poison control center or emergency room at once. NOTE: This medicine is only for you. Do not share this medicine with others. What if I miss a dose? If you miss a dose, take it as soon as you can. If it is almost time for your next dose, take only that dose. Do not take double or extra doses. What may interact with this medication? Probenecid Some other antibiotics This list may not describe all possible interactions. Give your health care provider a list of all the medicines, herbs, non-prescription drugs, or dietary supplements you use. Also tell them if you smoke, drink alcohol, or use illegal drugs. Some items may interact with your medicine. What should I watch for while using this medication? Tell your care team if your symptoms do not start to get better or if they get worse. Do not treat diarrhea with over the counter products. Contact your care team if you have diarrhea that lasts more than 2 days or if it is severe and watery. This medication may cause serious skin reactions. They can happen weeks to months after starting the medication. Contact your care team right away if you notice fevers  or flu-like symptoms with a rash. The rash may be red or purple and then turn into blisters or peeling of the skin. Or, you might notice a red rash with swelling of the face, lips or lymph nodes in your neck or under your arms. If you have diabetes, you may get a false-positive result for sugar in your urine. Check with your care team. What side effects may I notice from receiving this medication? Side effects that you should report to your care team as soon as  possible: Allergic reactions--skin rash, itching, hives, swelling of the face, lips, tongue, or throat Redness, blistering, peeling, or loosening of the skin, including inside the mouth Severe diarrhea, fever Unusual vaginal discharge, itching, or odor Side effects that usually do not require medical attention (report to your care team if they continue or are bothersome): Diarrhea Headache Nausea This list may not describe all possible side effects. Call your doctor for medical advice about side effects. You may report side effects to FDA at 1-800-FDA-1088. Where should I keep my medication? Keep out of the reach of children and pets. Store at room temperature between 20 and 25 degrees C (68 and 77 degrees F). Throw away any unused medication after the expiration date. NOTE: This sheet is a summary. It may not cover all possible information. If you have questions about this medicine, talk to your doctor, pharmacist, or health care provider.  2022 Elsevier/Gold Standard (2020-03-29 00:00:00)

## 2021-03-01 NOTE — Progress Notes (Signed)
Urine with bacteria, small blood and protein, sent for urine culture pending still.

## 2021-03-01 NOTE — Progress Notes (Signed)
Acute Office Visit  Subjective:    Patient ID: Erin Aguilar, female    DOB: 1950-12-16, 70 y.o.   MRN: [id number]  Chief Complaint  Patient presents with   Urinary Symptoms    Pt having burning while urinating and pain in pressure in her lower abdomen.     `  Urinary Frequency  This is a new problem. The current episode started in the past 7 days. The problem occurs intermittently. The problem has been gradually worsening. The quality of the pain is described as burning. The pain is moderate. There is No history of pyelonephritis. Associated symptoms include frequency. Pertinent negatives include no chills, discharge, hematuria, hesitancy, nausea, possible pregnancy, sweats, urgency or vomiting. Erin Aguilar has tried nothing for the symptoms. The treatment provided mild (used Premarin cream for few days) relief.  Drinking fluids all day long. Dysuria, seems to have mildly improved. Denies any hematuria gross or visualized. Denies any pain, other than burning with urination and pelvic heaviness at times.  Denies any back pain.  Erin Aguilar has been using premarin cream for a year.   Patient  denies any fever, body aches,chills, rash, chest pain, shortness of breath, nausea, vomiting, or diarrhea.    Past Medical History:  Diagnosis Date   Allergy    Hay fever   Arthritis    Chicken pox    Colon polyp    Diverticulitis    GERD (gastroesophageal reflux disease)    H/O: pertussis    Headache(784.0)    Hemochromatosis carrier    sister has disease   Hemorrhoids, internal, with bleeding    occasional   Hyperlipidemia    Migraines    UTI (lower urinary tract infection)     Past Surgical History:  Procedure Laterality Date   ABDOMINAL HYSTERECTOMY  1988   CHOLECYSTECTOMY  1976   GASTRIC BYPASS  03/2010   HERNIA REPAIR  2008   ventral ,  from scar tissue, [place]   JOINT REPLACEMENT Bilateral 02/26/2014   Dr. Howell Rucks    Family History  Problem Relation Age of Onset    Arthritis Mother        rheumatoid    Cancer Mother        lymphoma   Early death Mother 35       lymphoma   Arthritis Father    Stroke Father    Cancer Sister 72       cervical ca   Kidney disease Brother    Cancer Brother        lung ca,  smoker   Arthritis Maternal Grandmother    Heart disease Maternal Grandmother    Diabetes Maternal Grandmother    Arthritis Maternal Grandfather    Heart disease Maternal Grandfather    Diabetes Maternal Grandfather    Breast cancer Neg Hx     Social History   Socioeconomic History   Marital status: Married    Spouse name: Not on file   Number of children: 3   Years of education: Not on file   Highest education level: Not on file  Occupational History   Occupation: retired  Tobacco Use   Smoking status: Former    Types: Cigarettes    Quit date: 12/08/1986    Years since quitting: 34.2   Smokeless tobacco: Never  Vaping Use   Vaping Use: Never used  Substance and Sexual Activity   Alcohol use: Yes    Alcohol/week: 3.0 standard drinks    Types: 3 Standard  drinks or equivalent per week   Drug use: No   Sexual activity: Not on file  Other Topics Concern   Not on file  Social History Narrative   Not on file   Social Determinants of Health   Financial Resource Strain: Low Risk    Difficulty of Paying Living Expenses: Not hard at all  Food Insecurity: No Food Insecurity   Worried About Charity fundraiser in the Last Year: Never true   [place] in the Last Year: Never true  Transportation Needs: No Transportation Needs   Lack of Transportation (Medical): No   Lack of Transportation (Non-Medical): No  Physical Activity: Not on file  Stress: No Stress Concern Present   Feeling of Stress : Not at all  Social Connections: Unknown   Frequency of Communication with Friends and Family: Not on file   Frequency of Social Gatherings with Friends and Family: Not on file   Attends Religious Services: Not on file   Active  Member of Clubs or Organizations: Not on file   Attends Archivist Meetings: Not on file   Marital Status: Married  Human resources officer Violence: Not At Risk   Fear of Current or Ex-Partner: No   Emotionally Abused: No   Physically Abused: No   Sexually Abused: No    Outpatient Medications Prior to Visit  Medication Sig Dispense Refill   aspirin 81 MG chewable tablet Chew 81 mg by mouth.     atorvastatin (LIPITOR) 80 MG tablet TAKE 1 TABLET BY MOUTH ONCE DAILY. 90 tablet 0   busPIRone (BUSPAR) 10 MG tablet Take 1 tablet (10 mg total) by mouth 3 (three) times daily. 270 tablet 3   butalbital-acetaminophen-caffeine (FIORICET WITH CODEINE) 50-325-40-30 MG capsule TAKE 1 CAPSULE BY MOUTH TWICE DAILY AS NEEDED FOR PAIN 60 capsule 5   DULoxetine (CYMBALTA) 60 MG capsule TAKE (1) CAPSULE BY MOUTH ONCE DAILY. 90 capsule 0   escitalopram (LEXAPRO) 20 MG tablet TAKE 1 TABLET BY MOUTH ONCE DAILY. 90 tablet 0   esomeprazole (NEXIUM) 40 MG capsule TAKE 1 CAPSULE IN THE MORNING BEFORE BREAKFAST. 90 capsule 0   Multiple Vitamin (MULTIVITAMIN) tablet Take 1 tablet by mouth daily.     pramipexole (MIRAPEX) 0.25 MG tablet TAKE ONE TABLET BY MOUTH AT BEDTIME. 90 tablet 0   PREMARIN vaginal cream Place 1 Applicatorful vaginally. Twice weekly     UNABLE TO FIND Hemp Cannabinoid extract 1/2 dropper sublingual once daily for arthritis pain.     Facility-Administered Medications Prior to Visit  Medication Dose Route Frequency Provider Last Rate Last Admin   0.9 %  sodium chloride infusion  500 mL Intravenous Continuous Danis, Estill Cotta III, MD        Allergies  Allergen Reactions   Ciprofloxacin Itching   Oxycodone Itching   Trazodone And Nefazodone     Review of Systems  Constitutional: Negative.  Negative for chills.  HENT: Negative.    Respiratory: Negative.    Cardiovascular: Negative.   Gastrointestinal: Negative.  Negative for nausea and vomiting.  Genitourinary:  Positive for dysuria  and frequency. Negative for decreased urine volume, difficulty urinating, dyspareunia, enuresis, genital sores, hematuria, hesitancy, menstrual problem, pelvic pain, urgency, vaginal bleeding, vaginal discharge and vaginal pain.  Musculoskeletal: Negative.   Neurological: Negative.   Psychiatric/Behavioral: Negative.        Objective:    Physical Exam Constitutional:      General: Erin Aguilar is not in acute distress.  Appearance: Erin Aguilar is obese. Erin Aguilar is not ill-appearing, toxic-appearing or diaphoretic.  HENT:     Head: Normocephalic and atraumatic.     Right Ear: External ear normal.     Left Ear: External ear normal.     Nose: Nose normal.     Mouth/Throat:     Pharynx: Oropharynx is clear.  Eyes:     Conjunctiva/sclera: Conjunctivae normal.     Pupils: Pupils are equal, round, and reactive to light.  Cardiovascular:     Rate and Rhythm: Normal rate and regular rhythm.     Pulses: Normal pulses.     Heart sounds: Normal heart sounds. No murmur heard.   No friction rub. No gallop.  Pulmonary:     Effort: Pulmonary effort is normal. No respiratory distress.     Breath sounds: Normal breath sounds. No stridor. No wheezing, rhonchi or rales.  Chest:     Chest wall: No tenderness.  Abdominal:     General: Bowel sounds are normal. There is no distension.     Palpations: Abdomen is soft.     Tenderness: There is no abdominal tenderness. There is no right CVA tenderness or left CVA tenderness.  Musculoskeletal:        General: Normal range of motion.     Cervical back: Normal range of motion and neck supple.  Skin:    General: Skin is warm.     Findings: No rash.  Neurological:     Mental Status: Erin Aguilar is alert and oriented to person, place, and time.     Motor: No weakness.     Gait: Gait normal.  Psychiatric:        Mood and Affect: Mood normal.        Behavior: Behavior normal.        Thought Content: Thought content normal.    BP 122/84   Pulse 67   Temp (!) 95.7 F (35.4  C)   Ht 5' 2.01" (1.575 m)   Wt 188 lb 3.2 oz (85.4 kg)   SpO2 96%   BMI 34.41 kg/m  Wt Readings from Last 3 Encounters:  03/01/21 188 lb 3.2 oz (85.4 kg)  01/10/21 186 lb (84.4 kg)  12/09/20 186 lb 12.8 oz (84.7 kg)    Health Maintenance Due  Topic Date Due   COVID-19 Vaccine (4 - Booster for Moderna series) 04/15/2020   INFLUENZA VACCINE  11/15/2020    There are no preventive care reminders to display for this patient.   Lab Results  Component Value Date   TSH 2.31 06/10/2020   Lab Results  Component Value Date   WBC 5.2 10/22/2019   HGB 13.4 10/22/2019   HCT 39.4 10/22/2019   MCV 102.8 (H) 10/22/2019   PLT 210.0 10/22/2019   Lab Results  Component Value Date   NA 137 12/09/2020   K 4.6 12/09/2020   CO2 26 12/09/2020   GLUCOSE 100 (H) 12/09/2020   BUN 13 12/09/2020   CREATININE 0.93 12/09/2020   BILITOT 0.4 12/09/2020   ALKPHOS 98 12/09/2020   AST 30 12/09/2020   ALT 24 12/09/2020   PROT 7.2 12/09/2020   ALBUMIN 4.1 12/09/2020   CALCIUM 9.3 12/09/2020   GFR 61.44 12/09/2020   Lab Results  Component Value Date   CHOL 146 12/09/2020   Lab Results  Component Value Date   HDL 74.70 12/09/2020   Lab Results  Component Value Date   LDLCALC 49 12/09/2020   Lab Results  Component Value  Date   TRIG 113.0 12/09/2020   Lab Results  Component Value Date   CHOLHDL 2 12/09/2020   Lab Results  Component Value Date   HGBA1C 6.3 12/09/2020       Assessment & Plan:   Problem List Items Addressed This Visit   None Visit Diagnoses     Acute cystitis with hematuria    -  Primary   Relevant Medications   cephALEXin (KEFLEX) 500 MG capsule   Other Relevant Orders   CULTURE, URINE COMPREHENSIVE     Increase fluids. Complete all of antibiotics leucocytes, trace protein and trace blood in urine.  Repeat microscopic 1-2 weeks after completing antibiotics.  Urine culture sent, will call if antibiotic needs changing.    Meds ordered this encounter   Medications   cephALEXin (KEFLEX) 500 MG capsule    Sig: Take 1 capsule (500 mg total) by mouth 3 (three) times daily.    Dispense:  21 capsule    Refill:  0   Return if symptoms worsen or fail to improve, for at any time for any worsening symptoms, Go to Emergency room/ urgent care if worse. Red Flags discussed. The patient was given clear instructions to go to ER or return to medical center if any red flags develop, symptoms do not improve, worsen or new problems develop. They verbalized understanding.   Marcille Buffy, FNP

## 2021-03-04 LAB — CULTURE, URINE COMPREHENSIVE
MICRO NUMBER:: 12639428
SPECIMEN QUALITY:: ADEQUATE

## 2021-03-06 NOTE — Progress Notes (Signed)
Patient is on keflex should be susceptible to bacteria in urine. Complete antibiotics. Recheck urine microscopic and culture 2 weeks after completing antibiotics.   Advised patient call the office or your primary care doctor for an appointment if no improvement within 72 hours or if any symptoms change or worsen at any time  Advised ER or urgent Care if after hours or on weekend. Call 911 for emergency symptoms at any time.Patinet verbalized understanding of all instructions given/reviewed and treatment plan and has no further questions or concerns at this time.

## 2021-03-07 ENCOUNTER — Telehealth: Payer: Self-pay

## 2021-03-07 ENCOUNTER — Other Ambulatory Visit: Payer: Self-pay | Admitting: Adult Health

## 2021-03-07 DIAGNOSIS — T3695XA Adverse effect of unspecified systemic antibiotic, initial encounter: Secondary | ICD-10-CM

## 2021-03-07 DIAGNOSIS — B379 Candidiasis, unspecified: Secondary | ICD-10-CM

## 2021-03-07 MED ORDER — TERCONAZOLE 0.8 % VA CREA: 1.0000 | TOPICAL_CREAM | Freq: Every day | VAGINAL | 0 refills | Status: DC

## 2021-03-07 NOTE — Progress Notes (Signed)
No orders of the defined types were placed in this encounter.  Meds ordered this encounter  Medications   terconazole (TERAZOL 3) 0.8 % vaginal cream    Sig: Place 1 applicator vaginally at bedtime. X 3 days.    Dispense:  20 g    Refill:  0

## 2021-03-07 NOTE — Telephone Encounter (Signed)
Placed order for urine microscopic and urine culture for future labs

## 2021-03-08 ENCOUNTER — Telehealth: Payer: Self-pay

## 2021-03-08 NOTE — Telephone Encounter (Signed)
LMTCB

## 2021-03-09 ENCOUNTER — Telehealth: Payer: Self-pay

## 2021-03-09 DIAGNOSIS — B379 Candidiasis, unspecified: Secondary | ICD-10-CM

## 2021-03-09 NOTE — Telephone Encounter (Signed)
Orders placed for urine culture and urine microscopic for future labs

## 2021-03-15 ENCOUNTER — Other Ambulatory Visit: Payer: Self-pay

## 2021-03-15 ENCOUNTER — Other Ambulatory Visit (INDEPENDENT_AMBULATORY_CARE_PROVIDER_SITE_OTHER): Payer: Medicare PPO

## 2021-03-15 DIAGNOSIS — T3695XA Adverse effect of unspecified systemic antibiotic, initial encounter: Secondary | ICD-10-CM

## 2021-03-15 DIAGNOSIS — B379 Candidiasis, unspecified: Secondary | ICD-10-CM | POA: Diagnosis not present

## 2021-03-15 LAB — URINALYSIS, MICROSCOPIC ONLY

## 2021-03-15 NOTE — Progress Notes (Signed)
Few bacteria in urine, urine culture has been sent and pending.

## 2021-03-16 ENCOUNTER — Telehealth: Payer: Self-pay | Admitting: *Deleted

## 2021-03-16 LAB — URINE CULTURE
MICRO NUMBER:: 12690027
Result:: NO GROWTH
SPECIMEN QUALITY:: ADEQUATE

## 2021-03-16 NOTE — Telephone Encounter (Signed)
-----   Message from Berniece Pap, FNP sent at 03/15/2021  2:53 PM EST ----- Few bacteria in urine, urine culture has been sent and pending.

## 2021-03-16 NOTE — Telephone Encounter (Signed)
Patient voiced understanding of results and pending culture.

## 2021-03-16 NOTE — Telephone Encounter (Signed)
LMTCB

## 2021-03-17 NOTE — Progress Notes (Signed)
No growth on urine culture.  Return to office at anytime should symptoms persist.

## 2021-04-06 DIAGNOSIS — H34831 Tributary (branch) retinal vein occlusion, right eye, with macular edema: Secondary | ICD-10-CM | POA: Diagnosis not present

## 2021-04-13 ENCOUNTER — Other Ambulatory Visit: Payer: Self-pay | Admitting: Internal Medicine

## 2021-05-18 ENCOUNTER — Other Ambulatory Visit: Payer: Self-pay | Admitting: Internal Medicine

## 2021-06-13 ENCOUNTER — Ambulatory Visit: Payer: Medicare PPO | Admitting: Internal Medicine

## 2021-06-13 ENCOUNTER — Other Ambulatory Visit: Payer: Self-pay

## 2021-06-13 ENCOUNTER — Encounter: Payer: Self-pay | Admitting: Internal Medicine

## 2021-06-13 VITALS — BP 108/78 | HR 48 | Temp 98.2°F | Ht 62.0 in | Wt 191.6 lb

## 2021-06-13 DIAGNOSIS — R7303 Prediabetes: Secondary | ICD-10-CM

## 2021-06-13 DIAGNOSIS — F32A Depression, unspecified: Secondary | ICD-10-CM

## 2021-06-13 DIAGNOSIS — R5383 Other fatigue: Secondary | ICD-10-CM

## 2021-06-13 DIAGNOSIS — F411 Generalized anxiety disorder: Secondary | ICD-10-CM

## 2021-06-13 DIAGNOSIS — R300 Dysuria: Secondary | ICD-10-CM

## 2021-06-13 DIAGNOSIS — E7849 Other hyperlipidemia: Secondary | ICD-10-CM | POA: Diagnosis not present

## 2021-06-13 DIAGNOSIS — E669 Obesity, unspecified: Secondary | ICD-10-CM

## 2021-06-13 LAB — COMPREHENSIVE METABOLIC PANEL
ALT: 25 U/L (ref 0–35)
AST: 29 U/L (ref 0–37)
Albumin: 4.2 g/dL (ref 3.5–5.2)
Alkaline Phosphatase: 87 U/L (ref 39–117)
BUN: 16 mg/dL (ref 6–23)
CO2: 28 mEq/L (ref 19–32)
Calcium: 9.2 mg/dL (ref 8.4–10.5)
Chloride: 105 mEq/L (ref 96–112)
Creatinine, Ser: 0.9 mg/dL (ref 0.40–1.20)
GFR: 63.68 mL/min (ref >60.00)
Glucose, Bld: 105 mg/dL — ABNORMAL HIGH (ref 70–99)
Potassium: 4.2 mEq/L (ref 3.5–5.1)
Sodium: 140 mEq/L (ref 135–145)
Total Bilirubin: 0.5 mg/dL (ref 0.2–1.2)
Total Protein: 7.1 g/dL (ref 6.0–8.3)

## 2021-06-13 LAB — CBC WITH DIFFERENTIAL/PLATELET
Basophils Absolute: 0.1 10*3/uL (ref 0.0–0.1)
Basophils Relative: 0.9 % (ref 0.0–3.0)
Eosinophils Absolute: 0.2 10*3/uL (ref 0.0–0.7)
Eosinophils Relative: 3.2 % (ref 0.0–5.0)
HCT: 38.4 % (ref 36.0–46.0)
Hemoglobin: 12.8 g/dL (ref 12.0–15.0)
Lymphocytes Relative: 24.3 % (ref 12.0–46.0)
Lymphs Abs: 1.9 10*3/uL (ref 0.7–4.0)
MCHC: 33.4 g/dL (ref 30.0–36.0)
MCV: 101.7 fl — ABNORMAL HIGH (ref 78.0–100.0)
Monocytes Absolute: 0.4 10*3/uL (ref 0.1–1.0)
Monocytes Relative: 5.6 % (ref 3.0–12.0)
Neutro Abs: 5 10*3/uL (ref 1.4–7.7)
Neutrophils Relative %: 66 % (ref 43.0–77.0)
Platelets: 185 10*3/uL (ref 150.0–400.0)
RBC: 3.77 Mil/uL — ABNORMAL LOW (ref 3.87–5.11)
RDW: 12.4 % (ref 11.5–15.5)
WBC: 7.6 10*3/uL (ref 4.0–10.5)

## 2021-06-13 LAB — URINALYSIS, ROUTINE W REFLEX MICROSCOPIC
Bilirubin Urine: NEGATIVE
Ketones, ur: NEGATIVE
Nitrite: POSITIVE — AB
Specific Gravity, Urine: 1.025 (ref 1.000–1.030)
Urine Glucose: NEGATIVE
Urobilinogen, UA: 0.2 (ref 0.0–1.0)
pH: 5.5 (ref 5.0–8.0)

## 2021-06-13 LAB — LIPID PANEL
Cholesterol: 140 mg/dL (ref 0–200)
HDL: 70.8 mg/dL (ref >39.00)
LDL Cholesterol: 47 mg/dL (ref 0–99)
NonHDL: 69.6
Total CHOL/HDL Ratio: 2
Triglycerides: 115 mg/dL (ref 0.0–149.0)
VLDL: 23 mg/dL (ref 0.0–40.0)

## 2021-06-13 LAB — HEMOGLOBIN A1C: Hgb A1c MFr Bld: 6.2 % (ref 4.6–6.5)

## 2021-06-13 MED ORDER — OZEMPIC (0.25 OR 0.5 MG/DOSE) 2 MG/1.5ML ~~LOC~~ SOPN: 0.2500 mg | PEN_INJECTOR | SUBCUTANEOUS | 2 refills | Status: DC

## 2021-06-13 NOTE — Progress Notes (Signed)
Subjective:  Patient ID: Erin Aguilar, female    DOB: Dec 06, 1950  Age: 71 y.o. MRN: [id number]  CC: The primary encounter diagnosis was Prediabetes. Diagnoses of Other hyperlipidemia, Fatigue, unspecified type, Dysuria, Anxiety state, Fatigue due to depression, and Obesity (BMI 35.0-39.9 without comorbidity) were also pertinent to this visit.   This visit occurred during the SARS-CoV-2 public health emergency.  Safety protocols were in place, including screening questions prior to the visit, additional usage of staff PPE, and extensive cleaning of exam room while observing appropriate contact time as indicated for disinfecting solutions.    HPI YARIMA PENMAN presents for  Chief Complaint  Patient presents with   Follow-up    6 month follow up hyperlipidemia and anxiety   GAD/depression :  taking lexapro 20 mg daily , buspirone ,  and cymbalta   reports Some depressive symptoms occasionally, mainly just feeling unmotivated .  Not morose,  not crying .  Not visibly worried about husband's declining health anymore but thinks subconsciously she worries.  Happy in marriage  loves her husband .  Has a grandson in prison since age 47 (62 yrs so far,  for armed robbery and manslaughter) , worried about him bc he was in a gang .  Doesn't want to see a counsellor or ADD MORE medication.   Dysuria:  intermittent since Friday   Obesity:  frustrated at weight gain.  got to 170 lbs after bariatric surgery.  she has regained  to 192 despite giving up bread and sugar . Not exercising regularly due to chronic knee pain.  [facility] [employer]  Wants to start ozempic .    Hyperlipidemia: tolerating atorvastatin    Outpatient Medications Prior to Visit  Medication Sig Dispense Refill   aspirin 81 MG chewable tablet Chew 81 mg by mouth.     atorvastatin (LIPITOR) 80 MG tablet TAKE 1 TABLET BY MOUTH ONCE DAILY. 90 tablet 0   busPIRone (BUSPAR) 10 MG tablet Take 1 tablet (10 mg total) by mouth 3  (three) times daily. 270 tablet 3   butalbital-acetaminophen-caffeine (FIORICET WITH CODEINE) 50-325-40-30 MG capsule TAKE 1 CAPSULE BY MOUTH TWICE DAILY AS NEEDED FOR PAIN 60 capsule 5   DULoxetine (CYMBALTA) 60 MG capsule TAKE (1) CAPSULE BY MOUTH ONCE DAILY. 90 capsule 1   escitalopram (LEXAPRO) 20 MG tablet TAKE 1 TABLET BY MOUTH ONCE DAILY. 90 tablet 0   esomeprazole (NEXIUM) 40 MG capsule TAKE 1 CAPSULE IN THE MORNING BEFORE BREAKFAST. 90 capsule 0   Multiple Vitamin (MULTIVITAMIN) tablet Take 1 tablet by mouth daily.     pramipexole (MIRAPEX) 0.25 MG tablet TAKE ONE TABLET BY MOUTH AT BEDTIME. 90 tablet 0   PREMARIN vaginal cream Place 1 Applicatorful vaginally. Twice weekly     cephALEXin (KEFLEX) 500 MG capsule Take 1 capsule (500 mg total) by mouth 3 (three) times daily. (Patient not taking: Reported on 06/13/2021) 21 capsule 0   terconazole (TERAZOL 3) 0.8 % vaginal cream Place 1 applicator vaginally at bedtime. X 3 days. (Patient not taking: Reported on 06/13/2021) 20 g 0   UNABLE TO FIND Hemp Cannabinoid extract 1/2 dropper sublingual once daily for arthritis pain. (Patient not taking: Reported on 06/13/2021)     Facility-Administered Medications Prior to Visit  Medication Dose Route Frequency Provider Last Rate Last Admin   0.9 %  sodium chloride infusion  500 mL Intravenous Continuous Danis, Kirke Corin, MD        Review of  Systems;  Patient denies headache, fevers, malaise, unintentional weight loss, skin rash, eye pain, sinus congestion and sinus pain, sore throat, dysphagia,  hemoptysis , cough, dyspnea, wheezing, chest pain, palpitations, orthopnea, edema, abdominal pain, nausea, melena, diarrhea, constipation, flank pain, dysuria, hematuria, urinary  Frequency, nocturia, numbness, tingling, seizures,  Focal weakness, Loss of consciousness,  Tremor, insomnia, depression, anxiety, and suicidal ideation.      Objective:  BP 108/78 (BP Location: Left Arm, Patient Position:  Sitting, Cuff Size: Large)    Pulse (!) 48    Temp 98.2 F (36.8 C) (Oral)    Ht '5\' 2"'  (1.575 m)    Wt 191 lb 9.6 oz (86.9 kg)    SpO2 99%    BMI 35.04 kg/m   BP Readings from Last 3 Encounters:  06/13/21 108/78  03/01/21 122/84  12/09/20 116/72    Wt Readings from Last 3 Encounters:  06/13/21 191 lb 9.6 oz (86.9 kg)  03/01/21 188 lb 3.2 oz (85.4 kg)  01/10/21 186 lb (84.4 kg)    General appearance: alert, cooperative and appears stated age Ears: normal TM's and external ear canals both ears Throat: lips, mucosa, and tongue normal; teeth and gums normal Neck: no adenopathy, no carotid bruit, supple, symmetrical, trachea midline and thyroid not enlarged, symmetric, no tenderness/mass/nodules Back: symmetric, no curvature. ROM normal. No CVA tenderness. Lungs: clear to auscultation bilaterally Heart: regular rate and rhythm, S1, S2 normal, no murmur, click, rub or gallop Abdomen: soft, non-tender; bowel sounds normal; no masses,  no organomegaly Pulses: 2+ and symmetric Skin: Skin color, texture, turgor normal. No rashes or lesions Lymph nodes: Cervical, supraclavicular, and axillary nodes normal.  Lab Results  Component Value Date   HGBA1C 6.3 12/09/2020   HGBA1C 6.0 06/10/2020   HGBA1C 6.0 10/22/2019    Lab Results  Component Value Date   CREATININE 0.93 12/09/2020   CREATININE 0.85 07/02/2020   CREATININE 1.03 06/10/2020    Lab Results  Component Value Date   WBC 5.2 10/22/2019   HGB 13.4 10/22/2019   HCT 39.4 10/22/2019   PLT 210.0 10/22/2019   GLUCOSE 100 (H) 12/09/2020   CHOL 146 12/09/2020   TRIG 113.0 12/09/2020   HDL 74.70 12/09/2020   LDLDIRECT 106.0 06/10/2012   LDLCALC 49 12/09/2020   ALT 24 12/09/2020   AST 30 12/09/2020   NA 137 12/09/2020   K 4.6 12/09/2020   CL 103 12/09/2020   CREATININE 0.93 12/09/2020   BUN 13 12/09/2020   CO2 26 12/09/2020   TSH 2.31 06/10/2020   HGBA1C 6.3 12/09/2020   MICROALBUR 0.8 12/09/2020    MM 3D SCREEN  BREAST BILATERAL  Result Date: 01/25/2021 CLINICAL DATA:  Screening. EXAM: DIGITAL SCREENING BILATERAL MAMMOGRAM WITH TOMOSYNTHESIS AND CAD TECHNIQUE: Bilateral screening digital craniocaudal and mediolateral oblique mammograms were obtained. Bilateral screening digital breast tomosynthesis was performed. The images were evaluated with computer-aided detection. COMPARISON:  Previous exam(s). ACR Breast Density Category b: There are scattered areas of fibroglandular density. FINDINGS: There are no findings suspicious for malignancy. IMPRESSION: No mammographic evidence of malignancy. A result letter of this screening mammogram will be mailed directly to the patient. RECOMMENDATION: Screening mammogram in one year. (Code:SM-B-01Y) BI-RADS CATEGORY  1: Negative. Electronically Signed   By: Abelardo Diesel M.D.   On: 01/25/2021 09:24   Assessment & Plan:   Problem List Items Addressed This Visit     Anxiety state    Her daytime anxiety had improved with higher Cymbalta dose, but she finds  herself tensing up frequently at night .  Did not tolerate  trazodone trial for  management of insomnia , advised to limit use of alprazolam to early waking . Changing lexapro to Paxil R has helped. And using  buspirone  10 mg bid/tid, avoiding increased use of alprazolam        Fatigue due to depression    Symptoms limited to lack of motivation.  Continue cymbalta for chronic pain. lexapr and buspirone. counselling referral declined.  encourged to start exercising       Obesity (BMI 35.0-39.9 without comorbidity)    Patient has been unable to lose or maintain a healthy weight despite good effort. Encouraged to increase exercise involvement to include more intense aerobic activity for 30 minutes 5 days per week.  Screened for contraindications to use of  GLP 1 agonists for appetite suppression and she has none.  The risks and benefits of pharmacotherapy discussed and she is requesting a trial of therapy .  rx written.        Relevant Medications   Semaglutide,0.25 or 0.5MG/DOS, (OZEMPIC, 0.25 OR 0.5 MG/DOSE,) 2 MG/1.5ML SOPN   Prediabetes - Primary    With obesity worsening due to inactivity.  Patient has been unable to lose or maintain a healthy weight despite watching her diet. Ozempic requested . Encouraged to increase exercise involvement to include  aerobic activity for 30 minutes 5 days per week.  Screened for contraindications to use of  GLP 1 agonists for appetite suppression and she has none.  The risks and benefits of pharmacotherapy discussed and she is requesting a trial of therapy .  rx written. May require trial of metformin first      Relevant Orders   Comp Met (CMET)   HgB A1c   Other Visit Diagnoses     Other hyperlipidemia       Relevant Orders   Lipid Profile   Fatigue, unspecified type       Relevant Orders   TSH   CBC with Differential/Platelet   Dysuria       Relevant Orders   Urinalysis, Routine w reflex microscopic   Urine Culture       I spent 30 minutes dedicated to the care of this patient on the date of this encounter to include pre-visit review of patient's medical history,  most recent imaging studies, Face-to-face time with the patient , and post visit ordering of testing and therapeutics.    Follow-up: No follow-ups on file.   Crecencio Mc, MD

## 2021-06-13 NOTE — Assessment & Plan Note (Signed)
Her daytime anxiety had improved with higher Cymbalta dose, but she finds herself tensing up frequently at night .  Did not tolerate  trazodone trial for  management of insomnia , advised to limit use of alprazolam to early waking . Changing lexapro to Paxil R has helped. And using  buspirone  10 mg bid/tid, avoiding increased use of alprazolam   

## 2021-06-13 NOTE — Patient Instructions (Signed)
I agree with starting  Ozempic  to help you lose weight  , if your insurance will cover it.  I have sent the rx to your pharmacy   ozempic is a medication that is taken as a weekly subcutaneous injection. It is not insulin.  It  causes your pancreas to increase its  own insulin secretion  And also slows down the emptying of your stomach,  So it decreases your appetite and helps you lose weight.  The dose for the first 4 weekly doses is 0.25 mg.  You may have mild nausea on the first or second day but this should resolve.  If not  ,  stop the medication.   As long as you are losing weight,  you can continue the dose you are on .  Only increase the dose to 0.5 mg after 4 weeks if your weight has plateaued.  Let me know when you need a refill and what dose you are taking.    If insurance not cover until you try metformin ,  let me know and I will prescribe metformin first.

## 2021-06-13 NOTE — Assessment & Plan Note (Signed)
Symptoms limited to lack of motivation.  Continue cymbalta for chronic pain. lexapr and buspirone. counselling referral declined.  encourged to start exercising

## 2021-06-13 NOTE — Assessment & Plan Note (Signed)
Patient has been unable to lose or maintain a healthy weight despite good effort. Encouraged to increase exercise involvement to include more intense aerobic activity for 30 minutes 5 days per week.  Screened for contraindications to use of  GLP 1 agonists for appetite suppression and she has none.  The risks and benefits of pharmacotherapy discussed and she is requesting a trial of therapy .  rx written.  °

## 2021-06-13 NOTE — Assessment & Plan Note (Addendum)
With obesity worsening due to inactivity.  Patient has been unable to lose or maintain a healthy weight despite watching her diet. Ozempic requested . Encouraged to increase exercise involvement to include  aerobic activity for 30 minutes 5 days per week.  Screened for contraindications to use of  GLP 1 agonists for appetite suppression and she has none.  The risks and benefits of pharmacotherapy discussed and she is requesting a trial of therapy .  rx written. May require trial of metformin first

## 2021-06-14 LAB — TSH: TSH: 3.01 u[IU]/mL (ref 0.35–5.50)

## 2021-06-15 ENCOUNTER — Telehealth: Payer: Self-pay | Admitting: Internal Medicine

## 2021-06-15 ENCOUNTER — Other Ambulatory Visit: Payer: Self-pay | Admitting: Internal Medicine

## 2021-06-15 DIAGNOSIS — H34831 Tributary (branch) retinal vein occlusion, right eye, with macular edema: Secondary | ICD-10-CM | POA: Diagnosis not present

## 2021-06-15 LAB — URINE CULTURE
MICRO NUMBER:: 13061269
SPECIMEN QUALITY:: ADEQUATE

## 2021-06-15 MED ORDER — SULFAMETHOXAZOLE-TRIMETHOPRIM 800-160 MG PO TABS: 1.0000 | ORAL_TABLET | Freq: Two times a day (BID) | ORAL | 0 refills | Status: DC

## 2021-06-15 NOTE — Telephone Encounter (Signed)
noted 

## 2021-06-15 NOTE — Telephone Encounter (Signed)
The patient was given her UTI results as well as the instructions of how to take the prescribed med and to get one of the listed probiotics for good Bowel and vaginal health.  ?

## 2021-07-12 ENCOUNTER — Other Ambulatory Visit: Payer: Self-pay | Admitting: Internal Medicine

## 2021-08-04 ENCOUNTER — Other Ambulatory Visit: Payer: Self-pay | Admitting: Internal Medicine

## 2021-08-04 ENCOUNTER — Other Ambulatory Visit: Payer: Self-pay | Admitting: Ophthalmology

## 2021-08-04 DIAGNOSIS — H5711 Ocular pain, right eye: Secondary | ICD-10-CM

## 2021-08-04 DIAGNOSIS — H34831 Tributary (branch) retinal vein occlusion, right eye, with macular edema: Secondary | ICD-10-CM | POA: Diagnosis not present

## 2021-08-04 NOTE — Telephone Encounter (Signed)
Refilled: 01/19/2021 ?Last OV: 06/13/2021 ?Next OV: 12/14/2021 ?

## 2021-08-08 ENCOUNTER — Ambulatory Visit: Payer: Medicare PPO | Admitting: Internal Medicine

## 2021-08-08 ENCOUNTER — Encounter: Payer: Self-pay | Admitting: Internal Medicine

## 2021-08-08 DIAGNOSIS — G44229 Chronic tension-type headache, not intractable: Secondary | ICD-10-CM

## 2021-08-08 DIAGNOSIS — H5711 Ocular pain, right eye: Secondary | ICD-10-CM | POA: Diagnosis not present

## 2021-08-08 MED ORDER — OZEMPIC (0.25 OR 0.5 MG/DOSE) 2 MG/1.5ML ~~LOC~~ SOPN: 0.5000 mg | PEN_INJECTOR | SUBCUTANEOUS | 2 refills | Status: DC

## 2021-08-08 MED ORDER — GABAPENTIN 100 MG PO CAPS: 100.0000 mg | ORAL_CAPSULE | Freq: Three times a day (TID) | ORAL | 3 refills | Status: DC

## 2021-08-08 NOTE — Patient Instructions (Signed)
I AM STARTING YOU ON GABAPENTIN AT THE LOWEST DOSE: ? ?100 MG THREE TIMES DAILY ? ?YOU MAY INCREASE THE DOSE EVERY WEEK BY 1 CAPSULE (100 MG) IF NEEDED FOR PAIN  ?MANAGEMENT ? ? ?CONTINUE THE FIORICET FOR HEADACHES ? ? ?THE BUSPAR DOSE CAN BE INCREASED TO 15 MG TWICE DAILY IF NEEDED FOR ANXIETY  ?

## 2021-08-08 NOTE — Assessment & Plan Note (Signed)
Managed with fioricet  ?

## 2021-08-08 NOTE — Assessment & Plan Note (Signed)
3 week history of right eye pain.  S/p comprehensive exam by ophthalmologist.  CT scan ordered to rule out mass ,  But per patient  She was told that the pain was due to retinal artery occlusion vs compressed nerve. .  Gabapentin prescribed  ? ? ?

## 2021-08-08 NOTE — Progress Notes (Signed)
? ?Subjective:  ?Patient ID: Erin Aguilar, female    DOB: [birth year]  Age: 71 y.o. MRN: [id number] ? ?CC: Diagnoses of Chronic tension-type headache, not intractable and Acute right eye pain were pertinent to this visit. ? ? ?This visit occurred during the SARS-CoV-2 public health emergency.  Safety protocols were in place, including screening questions prior to the visit, additional usage of staff PPE, and extensive cleaning of exam room while observing appropriate contact time as indicated for disinfecting solutions.   ? ?HPI ?Erin Aguilar presents for management of right eye pain  ?Chief Complaint  ?Patient presents with  ? Acute Visit  ?  Pain behind right eye, needing a rx for gabapentin. Pt saw her eye doctor on 08/04/2021.  ? ?Hx:  71 yr old female with history of aortic atherosclerosis , chronic headache disorder, ho/ central  retinal artery occlusion  under current treatment ,  h/o  6 th nerve palsy,  (both in right eye)   developed pain behind the right eye 3 -4 weeks ago described as initially tabbing,  but currently  as a dull throbbing ache that recurs daily .  The pain is not aggravated  by eye movement , but increased by bright light at times. No changes in vision , no nausea, except from Ozempic.   ? ?Saw [name] last Thursday , had a full ophthalmologic exam and pictures taken told she had poor circulation given her history of retinal artery occlusion,  related to aging . No treatment advised except pain management.  ? ?CT scan scheduled for next Monday  ? ?Taking 0.5 mg ozempic . Having some nausea  but tolerating it and losing weight (9 lbs In   8 weeks )  ? ?Outpatient Medications Prior to Visit  ?Medication Sig Dispense Refill  ? atorvastatin (LIPITOR) 80 MG tablet TAKE 1 TABLET BY MOUTH ONCE DAILY. 90 tablet 0  ? butalbital-apap-caffeine-codeine (FIORICET WITH CODEINE) 50-325-40-30 MG capsule TAKE 1 CAPSULE BY MOUTH TWICE DAILY AS NEEDED FOR PAIN 60 capsule 5  ? DULoxetine (CYMBALTA) 60 MG  capsule TAKE (1) CAPSULE BY MOUTH ONCE DAILY. 90 capsule 1  ? escitalopram (LEXAPRO) 20 MG tablet TAKE 1 TABLET BY MOUTH ONCE DAILY. 90 tablet 0  ? esomeprazole (NEXIUM) 40 MG capsule TAKE 1 CAPSULE IN THE MORNING BEFORE BREAKFAST. 90 capsule 0  ? Multiple Vitamin (MULTIVITAMIN) tablet Take 1 tablet by mouth daily.    ? pramipexole (MIRAPEX) 0.25 MG tablet TAKE ONE TABLET BY MOUTH AT BEDTIME. 90 tablet 0  ? PREMARIN vaginal cream Place 1 Applicatorful vaginally. Twice weekly    ? Semaglutide,0.25 or 0.5MG /DOS, (OZEMPIC, 0.25 OR 0.5 MG/DOSE,) 2 MG/1.5ML SOPN Inject 0.25 mg into the skin once a week. (Patient taking differently: Inject 0.5 mg into the skin once a week.) 1.5 mL 2  ? aspirin 81 MG chewable tablet Chew 81 mg by mouth. (Patient not taking: Reported on 08/08/2021)    ? busPIRone (BUSPAR) 10 MG tablet Take 1 tablet (10 mg total) by mouth 3 (three) times daily. (Patient not taking: Reported on 08/08/2021) 270 tablet 3  ? sulfamethoxazole-trimethoprim (BACTRIM DS) 800-160 MG tablet Take 1 tablet by mouth 2 (two) times daily. (Patient not taking: Reported on 08/08/2021) 10 tablet 0  ? ?Facility-Administered Medications Prior to Visit  ?Medication Dose Route Frequency Provider Last Rate Last Admin  ? 0.9 %  sodium chloride infusion  500 mL Intravenous Continuous Danis, Andreas BlowerHenry L III, MD      ? ? ?Review  of Systems; ? ?Patient denies headache, fevers, malaise, unintentional weight loss, skin rash, eye pain, sinus congestion and sinus pain, sore throat, dysphagia,  hemoptysis , cough, dyspnea, wheezing, chest pain, palpitations, orthopnea, edema, abdominal pain, nausea, melena, diarrhea, constipation, flank pain, dysuria, hematuria, urinary  Frequency, nocturia, numbness, tingling, seizures,  Focal weakness, Loss of consciousness,  Tremor, insomnia, depression, anxiety, and suicidal ideation.   ? ? ? ?Objective:  ?BP 110/74 (BP Location: Left Arm, Patient Position: Sitting, Cuff Size: Large)   Pulse (!) 57   Temp  98.1 ?F (36.7 ?C) (Oral)   Ht 5\' 2"  (1.575 m)   Wt 182 lb 3.2 oz (82.6 kg)   SpO2 95%   BMI 33.32 kg/m?  ? ?BP Readings from Last 3 Encounters:  ?08/08/21 110/74  ?06/13/21 108/78  ?03/01/21 122/84  ? ? ?Wt Readings from Last 3 Encounters:  ?08/08/21 182 lb 3.2 oz (82.6 kg)  ?06/13/21 191 lb 9.6 oz (86.9 kg)  ?03/01/21 188 lb 3.2 oz (85.4 kg)  ? ? ?General appearance: alert, cooperative and appears stated age ?Ears: normal TM's and external ear canals both ears ?Eyes:  EOMI, PERRL, no scleral injection  ?Throat: lips, mucosa, and tongue normal; teeth and gums normal ?Neck: no adenopathy, no carotid bruit, supple, symmetrical, trachea midline and thyroid not enlarged, symmetric, no tenderness/mass/nodules ?Back: symmetric, no curvature. ROM normal. No CVA tenderness. ?Lungs: clear to auscultation bilaterally ?Heart: regular rate and rhythm, S1, S2 normal, no murmur, click, rub or gallop ?Abdomen: soft, non-tender; bowel sounds normal; no masses,  no organomegaly ?Pulses: 2+ and symmetric ?Skin: Skin color, texture, turgor normal. No rashes or lesions ?Lymph nodes: Cervical, supraclavicular, and axillary nodes normal. ? ?Lab Results  ?Component Value Date  ? HGBA1C 6.2 06/13/2021  ? HGBA1C 6.3 12/09/2020  ? HGBA1C 6.0 06/10/2020  ? ? ?Lab Results  ?Component Value Date  ? CREATININE 0.90 06/13/2021  ? CREATININE 0.93 12/09/2020  ? CREATININE 0.85 07/02/2020  ? ? ?Lab Results  ?Component Value Date  ? WBC 7.6 06/13/2021  ? HGB 12.8 06/13/2021  ? HCT 38.4 06/13/2021  ? PLT 185.0 06/13/2021  ? GLUCOSE 105 (H) 06/13/2021  ? CHOL 140 06/13/2021  ? TRIG 115.0 06/13/2021  ? HDL 70.80 06/13/2021  ? LDLDIRECT 106.0 06/10/2012  ? LDLCALC 47 06/13/2021  ? ALT 25 06/13/2021  ? AST 29 06/13/2021  ? NA 140 06/13/2021  ? K 4.2 06/13/2021  ? CL 105 06/13/2021  ? CREATININE 0.90 06/13/2021  ? BUN 16 06/13/2021  ? CO2 28 06/13/2021  ? TSH 3.01 06/13/2021  ? HGBA1C 6.2 06/13/2021  ? MICROALBUR 0.8 12/09/2020  ? ? ? ?Assessment &  Plan:  ? ?Problem List Items Addressed This Visit   ? ? Chronic headache disorder  ?  Managed with fioricet  ? ?  ?  ? Relevant Medications  ? gabapentin (NEURONTIN) 100 MG capsule  ? Acute right eye pain  ?  3 week history of right eye pain.  S/p comprehensive exam by ophthalmologist.  CT scan ordered to rule out mass ,  But per patient  She was told that the pain was due to retinal artery occlusion vs compressed nerve. .  Gabapentin prescribed  ? ? ? ?  ?  ? ? ?I spent a total of   minutes with this patient in a face to face visit on the date of this encounter reviewing  his most recent with his cardiologist on    ,  his diet and  eating habits   his blood pressure readings ,  last office visit with me in this visit  in      and post visit ordering of testing and therapeutics.   ? ?Follow-up: No follow-ups on file. ? ? ?Sherlene Shams, MD ?

## 2021-08-15 ENCOUNTER — Ambulatory Visit
Admission: RE | Admit: 2021-08-15 | Discharge: 2021-08-15 | Disposition: A | Discharge status: Home / Self Care | Payer: Medicare PPO | Source: Ambulatory Visit | Attending: Ophthalmology | Admitting: Ophthalmology

## 2021-08-15 DIAGNOSIS — H5711 Ocular pain, right eye: Secondary | ICD-10-CM | POA: Insufficient documentation

## 2021-09-08 DIAGNOSIS — H34831 Tributary (branch) retinal vein occlusion, right eye, with macular edema: Secondary | ICD-10-CM | POA: Diagnosis not present

## 2021-10-04 ENCOUNTER — Other Ambulatory Visit: Payer: Self-pay | Admitting: Internal Medicine

## 2021-11-07 ENCOUNTER — Ambulatory Visit: Payer: Medicare PPO | Admitting: Internal Medicine

## 2021-11-07 ENCOUNTER — Encounter: Payer: Self-pay | Admitting: Internal Medicine

## 2021-11-07 VITALS — BP 100/68 | HR 57 | Temp 97.9°F | Ht 62.0 in | Wt 175.8 lb

## 2021-11-07 DIAGNOSIS — E669 Obesity, unspecified: Secondary | ICD-10-CM | POA: Diagnosis not present

## 2021-11-07 DIAGNOSIS — E538 Deficiency of other specified B group vitamins: Secondary | ICD-10-CM

## 2021-11-07 DIAGNOSIS — R7303 Prediabetes: Secondary | ICD-10-CM | POA: Diagnosis not present

## 2021-11-07 DIAGNOSIS — I700 Atherosclerosis of aorta: Secondary | ICD-10-CM

## 2021-11-07 DIAGNOSIS — E7849 Other hyperlipidemia: Secondary | ICD-10-CM | POA: Diagnosis not present

## 2021-11-07 DIAGNOSIS — F411 Generalized anxiety disorder: Secondary | ICD-10-CM | POA: Diagnosis not present

## 2021-11-07 LAB — COMPREHENSIVE METABOLIC PANEL WITH GFR
ALT: 24 U/L (ref 0–35)
AST: 29 U/L (ref 0–37)
Albumin: 4.1 g/dL (ref 3.5–5.2)
Alkaline Phosphatase: 79 U/L (ref 39–117)
BUN: 14 mg/dL (ref 6–23)
CO2: 26 meq/L (ref 19–32)
Calcium: 9.2 mg/dL (ref 8.4–10.5)
Chloride: 104 meq/L (ref 96–112)
Creatinine, Ser: 0.83 mg/dL (ref 0.40–1.20)
GFR: 69.98 mL/min
Glucose, Bld: 103 mg/dL — ABNORMAL HIGH (ref 70–99)
Potassium: 4.5 meq/L (ref 3.5–5.1)
Sodium: 140 meq/L (ref 135–145)
Total Bilirubin: 0.4 mg/dL (ref 0.2–1.2)
Total Protein: 6.9 g/dL (ref 6.0–8.3)

## 2021-11-07 LAB — VITAMIN B12: Vitamin B-12: 529 pg/mL (ref 211–911)

## 2021-11-07 LAB — HEMOGLOBIN A1C: Hgb A1c MFr Bld: 6.2 % (ref 4.6–6.5)

## 2021-11-07 MED ORDER — ONDANSETRON HCL 4 MG PO TABS: 4.0000 mg | ORAL_TABLET | Freq: Three times a day (TID) | ORAL | 0 refills | Status: DC | PRN

## 2021-11-07 NOTE — Progress Notes (Unsigned)
Subjective:  Patient ID: Erin Aguilar, female    DOB: 09/21/1950  Age: 71 y.o. MRN: [id number]  CC: The primary encounter diagnosis was Prediabetes. Diagnoses of Other hyperlipidemia, B12 deficiency, Anxiety state, Obesity (BMI 30.0-34.9), and Atherosclerosis of aorta (HCC) were also pertinent to this visit.   HPI Erin Aguilar presents for  Chief Complaint  Patient presents with   Follow-up    3 month follow up    1) Obesity:  has lost 16 lbs since March,  Using wegovy  at the 0.5 mg weekly dose. Walking 2 days per week for exercise.  Walks up a hill that makes her a  little short of breath.  She works in the yard the other days  2) GAD:  taking lexapro and cymbalta  not using buspirone .  Mood Improving  in spite of Jim's frequent panic attacks and his feelings of claustrophobia .    3) Headaches : improving with gabapentin taken tid with  meals.   4) GERD : managed with nexium . No breakthrough symptoms . The risks of long term PPI use for acid suppression in patients without documented Barretts esophagus were discussed, including the possibility of osteoporosis, iron , B12 and magnesium deficiencies,  CKD, and dementia. Suggested trial of pepcid 20 mg daily alternating with PPI  Outpatient Medications Prior to Visit  Medication Sig Dispense Refill   aspirin 81 MG chewable tablet Chew 81 mg by mouth. (Patient not taking: Reported on 08/08/2021)     atorvastatin (LIPITOR) 80 MG tablet TAKE 1 TABLET BY MOUTH ONCE DAILY. 90 tablet 0   busPIRone (BUSPAR) 10 MG tablet Take 1 tablet (10 mg total) by mouth 3 (three) times daily. (Patient not taking: Reported on 08/08/2021) 270 tablet 3   butalbital-apap-caffeine-codeine (FIORICET WITH CODEINE) 50-325-40-30 MG capsule TAKE 1 CAPSULE BY MOUTH TWICE DAILY AS NEEDED FOR PAIN 60 capsule 5   DULoxetine (CYMBALTA) 60 MG capsule TAKE (1) CAPSULE BY MOUTH ONCE DAILY. 90 capsule 1   escitalopram (LEXAPRO) 20 MG tablet TAKE 1 TABLET BY MOUTH ONCE  DAILY. 90 tablet 0   esomeprazole (NEXIUM) 40 MG capsule TAKE 1 CAPSULE IN THE MORNING BEFORE BREAKFAST. 90 capsule 0   gabapentin (NEURONTIN) 100 MG capsule Take 1 capsule (100 mg total) by mouth 3 (three) times daily. Increase weekly by 100 mg if needed 90 capsule 3   Multiple Vitamin (MULTIVITAMIN) tablet Take 1 tablet by mouth daily.     OZEMPIC, 0.25 OR 0.5 MG/DOSE, 2 MG/3ML SOPN INJECT 0.5MG INTO THE SKIN ONCE A WEEK. 3 mL 11   pramipexole (MIRAPEX) 0.25 MG tablet TAKE ONE TABLET BY MOUTH AT BEDTIME. 90 tablet 0   PREMARIN vaginal cream Place 1 Applicatorful vaginally. Twice weekly     Facility-Administered Medications Prior to Visit  Medication Dose Route Frequency Provider Last Rate Last Admin   0.9 %  sodium chloride infusion  500 mL Intravenous Continuous Danis, Kirke Corin, MD        Review of Systems;  Patient denies headache, fevers, malaise, unintentional weight loss, skin rash, eye pain, sinus congestion and sinus pain, sore throat, dysphagia,  hemoptysis , cough, dyspnea, wheezing, chest pain, palpitations, orthopnea, edema, abdominal pain, nausea, melena, diarrhea, constipation, flank pain, dysuria, hematuria, urinary  Frequency, nocturia, numbness, tingling, seizures,  Focal weakness, Loss of consciousness,  Tremor, insomnia, depression, anxiety, and suicidal ideation.      Objective:  BP 100/68 (BP Location: Left Arm, Patient Position: Sitting, Cuff Size: Large)  Pulse (!) 57   Temp 97.9 F (36.6 C) (Oral)   Ht '5\' 2"'  (1.575 m)   Wt 175 lb 12.8 oz (79.7 kg)   SpO2 96%   BMI 32.15 kg/m   BP Readings from Last 3 Encounters:  11/07/21 100/68  08/08/21 110/74  06/13/21 108/78    Wt Readings from Last 3 Encounters:  11/07/21 175 lb 12.8 oz (79.7 kg)  08/08/21 182 lb 3.2 oz (82.6 kg)  06/13/21 191 lb 9.6 oz (86.9 kg)    General appearance: alert, cooperative and appears stated age Ears: normal TM's and external ear canals both ears Throat: lips, mucosa, and  tongue normal; teeth and gums normal Neck: no adenopathy, no carotid bruit, supple, symmetrical, trachea midline and thyroid not enlarged, symmetric, no tenderness/mass/nodules Back: symmetric, no curvature. ROM normal. No CVA tenderness. Lungs: clear to auscultation bilaterally Heart: regular rate and rhythm, S1, S2 normal, no murmur, click, rub or gallop Abdomen: soft, non-tender; bowel sounds normal; no masses,  no organomegaly Pulses: 2+ and symmetric Skin: Skin color, texture, turgor normal. No rashes or lesions Lymph nodes: Cervical, supraclavicular, and axillary nodes normal.  Lab Results  Component Value Date   HGBA1C 6.2 11/07/2021   HGBA1C 6.2 06/13/2021   HGBA1C 6.3 12/09/2020    Lab Results  Component Value Date   CREATININE 0.83 11/07/2021   CREATININE 0.90 06/13/2021   CREATININE 0.93 12/09/2020    Lab Results  Component Value Date   WBC 7.6 06/13/2021   HGB 12.8 06/13/2021   HCT 38.4 06/13/2021   PLT 185.0 06/13/2021   GLUCOSE 103 (H) 11/07/2021   CHOL 145 11/07/2021   TRIG 124 11/07/2021   HDL 74 11/07/2021   LDLDIRECT 106.0 06/10/2012   LDLCALC 50 11/07/2021   ALT 24 11/07/2021   AST 29 11/07/2021   NA 140 11/07/2021   K 4.5 11/07/2021   CL 104 11/07/2021   CREATININE 0.83 11/07/2021   BUN 14 11/07/2021   CO2 26 11/07/2021   TSH 3.01 06/13/2021   HGBA1C 6.2 11/07/2021   MICROALBUR 0.8 12/09/2020    CT ORBITS WO CONTRAST  Result Date: 08/16/2021 CLINICAL DATA:  Pain behind right eye. EXAM: CT ORBITS WITHOUT CONTRAST TECHNIQUE: Multidetector CT imaging of the orbits was performed using the standard protocol without intravenous contrast. Multiplanar CT image reconstructions were also generated. RADIATION DOSE REDUCTION: This exam was performed according to the departmental dose-optimization program which includes automated exposure control, adjustment of the mA and/or kV according to patient size and/or use of iterative reconstruction technique.  COMPARISON:  No pertinent prior exam. FINDINGS: Orbits: No orbital mass or evidence of inflammation. Normal appearance of the globes, optic nerve-sheath complexes, extraocular muscles, orbital fat and lacrimal glands. Visible paranasal sinuses: Clear Soft tissues: Negative Osseous: No fracture or aggressive lesion. Limited intracranial: No acute or significant finding. IMPRESSION: Negative. Electronically Signed   By: Rolm Baptise M.D.   On: 08/16/2021 07:39    Assessment & Plan:   Problem List Items Addressed This Visit     Obesity (BMI 30.0-34.9)    Continue Wegovy but increase the dose to 1.0 mg weekly      Anxiety state    Improving with lexapro/cymbalta .  No changes today      B12 deficiency    May be related to PPI use.  Willing to try alternating PPI with H2 blocker    Lab Results  Component Value Date   VITAMINB12 529 11/07/2021  Relevant Orders   Vitamin B12 (Completed)   Atherosclerosis of aorta [facility])    Reviewed CT  Today with patient   An Incidental finding on June 2020 abdominal CT . However, her "TIA" from 2019 actulaly left ischemic changes non her brain MRI.  Continue statin with goal  LDL  < 70 on atorvastatin.  No changes today   Lab Results  Component Value Date   CHOL 145 11/07/2021   HDL 74 11/07/2021   LDLCALC 50 11/07/2021   LDLDIRECT 106.0 06/10/2012   TRIG 124 11/07/2021   CHOLHDL 2.0 11/07/2021         Prediabetes - Primary    With obesity worsening due to inactivity.  Patient is tolerating Ozempic/Wegovy and losing weight  Lab Results  Component Value Date   HGBA1C 6.2 11/07/2021         Relevant Orders   HgB A1c (Completed)   Comp Met (CMET) (Completed)   Other Visit Diagnoses     Other hyperlipidemia       Relevant Orders   Lipid Panel w/reflex Direct LDL (Completed)       I spent a total of 30  minutes with this patient in a face to face visit on the date of this encounter reviewing the last office visit with me on  patient's diet and eating habits, home blood pressure readings ,  most recent imaging study ,   and post visit ordering of testing and therapeutics.    Follow-up: Return in about 3 months (around 02/07/2022).   Crecencio Mc, MD

## 2021-11-07 NOTE — Patient Instructions (Addendum)
You are doing great on the Ozempic/WEgovy!   Since your weight loss has stalled,  I recommend going tup to the next dose which is 1.0 mg weekly  .  Since you still have the previous left ,  Give yourself two (2)  of the  0.5 mg doses tomorrow (at the same time) .   This will equal the 1.0  mg next higher dose.  So let's make sure you will tolerate the higher dose before I prescribe it.  If you develop nausea , you can use the zofran .that I sent to pharmacy.   But if you require use of zofran every day,  that's not acceptable, and I will advise you to reduce your dose the following to the previous dose of 0.5 mg   Leave me a message next week about which dose you want me to refill (0.5 mg or 1.0 mg )    There have been several studies suggesting an association between long term use of a proton pump inhibitor ( Nexium, ) with an increased risk of dementia and kidney failure, so I recommend  you   try using an H2 blocker   called  Pepcid (famotidine ) 20 mg twice daily on alternate days with your Nexium .  This is available OTC .  If your reflux symptoms are controlled,  You can Continue the daily h2 blocker until they are not and the  resume a covered PPI.

## 2021-11-08 LAB — LIPID PANEL W/REFLEX DIRECT LDL
Cholesterol: 145 mg/dL (ref <200)
HDL: 74 mg/dL (ref >=50)
LDL Cholesterol (Calc): 50 mg/dL (calc)
Non-HDL Cholesterol (Calc): 71 mg/dL (calc) (ref <130)
Total CHOL/HDL Ratio: 2 (calc) (ref <5.0)
Triglycerides: 124 mg/dL (ref <150)

## 2021-11-08 NOTE — Assessment & Plan Note (Signed)
Reviewed CT  Today with patient   An Incidental finding on June 2020 abdominal CT . However, her "TIA" from 2019 actulaly left ischemic changes non her brain MRI.  Continue statin with goal  LDL  < 70 on atorvastatin.  No changes today   Lab Results  Component Value Date   CHOL 145 11/07/2021   HDL 74 11/07/2021   LDLCALC 50 11/07/2021   LDLDIRECT 106.0 06/10/2012   TRIG 124 11/07/2021   CHOLHDL 2.0 11/07/2021

## 2021-11-08 NOTE — Assessment & Plan Note (Signed)
May be related to PPI use.  Willing to try alternating PPI with H2 blocker    Lab Results  Component Value Date   VITAMINB12 529 11/07/2021

## 2021-11-08 NOTE — Assessment & Plan Note (Signed)
With obesity worsening due to inactivity.  Patient is tolerating Ozempic/Wegovy and losing weight  Lab Results  Component Value Date   HGBA1C 6.2 11/07/2021

## 2021-11-08 NOTE — Assessment & Plan Note (Signed)
Improving with lexapro/cymbalta .  No changes today 

## 2021-11-08 NOTE — Assessment & Plan Note (Signed)
Continue Wegovy but increase the dose to 1.0 mg weekly

## 2021-11-14 ENCOUNTER — Telehealth: Payer: Self-pay

## 2021-11-14 MED ORDER — SEMAGLUTIDE (1 MG/DOSE) 4 MG/3ML ~~LOC~~ SOPN: 1.0000 mg | PEN_INJECTOR | SUBCUTANEOUS | 2 refills | Status: DC

## 2021-11-14 NOTE — Telephone Encounter (Signed)
LMTCB

## 2021-11-14 NOTE — Telephone Encounter (Signed)
1 mg dose sent to [facility]

## 2021-11-14 NOTE — Telephone Encounter (Signed)
Spoke with pt and she stated that at her last appt she was advised to try increasing her Ozempic dose to 1 mg to see if she had side effects or not. Pt stated that she did not have any side effects and would like to have a rx for the 1 mg dose sent in to the pharmacy.

## 2021-11-14 NOTE — Telephone Encounter (Signed)
Patient states Dr. Duncan Dull asked her to call and let her know if she is able to take a higher dose of Ozempic.  Patient states she is able to take a higher dose and she states .7.  Patient states she needs it this week.  *Patient states her preferred pharmacy is [facility] in [place].

## 2021-12-02 ENCOUNTER — Telehealth: Payer: Self-pay | Admitting: Internal Medicine

## 2021-12-02 MED ORDER — DULOXETINE HCL 60 MG PO CPEP: ORAL_CAPSULE | ORAL | 1 refills | Status: DC

## 2021-12-02 NOTE — Telephone Encounter (Signed)
Medication has been refilled.

## 2021-12-02 NOTE — Telephone Encounter (Signed)
Pt husband called stating pt need refill on DULoxetine sent to [facility]

## 2021-12-14 ENCOUNTER — Encounter: Payer: Self-pay | Admitting: Internal Medicine

## 2021-12-14 ENCOUNTER — Ambulatory Visit (INDEPENDENT_AMBULATORY_CARE_PROVIDER_SITE_OTHER): Payer: Medicare PPO | Admitting: Internal Medicine

## 2021-12-14 ENCOUNTER — Ambulatory Visit (INDEPENDENT_AMBULATORY_CARE_PROVIDER_SITE_OTHER): Payer: Medicare PPO

## 2021-12-14 VITALS — BP 106/60 | HR 61 | Temp 97.9°F | Ht 62.0 in | Wt 172.6 lb

## 2021-12-14 DIAGNOSIS — R519 Headache, unspecified: Secondary | ICD-10-CM

## 2021-12-14 DIAGNOSIS — Z0000 Encounter for general adult medical examination without abnormal findings: Secondary | ICD-10-CM

## 2021-12-14 DIAGNOSIS — G8929 Other chronic pain: Secondary | ICD-10-CM

## 2021-12-14 DIAGNOSIS — H34831 Tributary (branch) retinal vein occlusion, right eye, with macular edema: Secondary | ICD-10-CM | POA: Diagnosis not present

## 2021-12-14 DIAGNOSIS — Z1231 Encounter for screening mammogram for malignant neoplasm of breast: Secondary | ICD-10-CM | POA: Diagnosis not present

## 2021-12-14 DIAGNOSIS — I700 Atherosclerosis of aorta: Secondary | ICD-10-CM

## 2021-12-14 DIAGNOSIS — Z23 Encounter for immunization: Secondary | ICD-10-CM | POA: Diagnosis not present

## 2021-12-14 DIAGNOSIS — M542 Cervicalgia: Secondary | ICD-10-CM

## 2021-12-14 DIAGNOSIS — E669 Obesity, unspecified: Secondary | ICD-10-CM | POA: Diagnosis not present

## 2021-12-14 DIAGNOSIS — M722 Plantar fascial fibromatosis: Secondary | ICD-10-CM

## 2021-12-14 MED ORDER — BUTALBITAL-APAP-CAFF-COD 50-325-40-30 MG PO CAPS: 1.0000 | ORAL_CAPSULE | Freq: Four times a day (QID) | ORAL | 5 refills | Status: DC | PRN

## 2021-12-14 MED ORDER — TIZANIDINE HCL 4 MG PO TABS: 4.0000 mg | ORAL_TABLET | Freq: Four times a day (QID) | ORAL | 5 refills | Status: DC | PRN

## 2021-12-14 NOTE — Assessment & Plan Note (Signed)
Chronic,  Radiates to center of head.  Suspect degenerative changes in cervical spine .  Muscle relaxer,  Topical NSAID,  Fioricet. Prescribed. Plain films ordered,  Advised to consider PT pending results of x rays

## 2021-12-14 NOTE — Assessment & Plan Note (Signed)
Diagnosed years ago and managed initially with steroid injections,  For the last several years she has been managing  with arch supports from [employer].

## 2021-12-14 NOTE — Assessment & Plan Note (Signed)

## 2021-12-14 NOTE — Assessment & Plan Note (Signed)
Addressed previously,  Taking lipitor .  LDL is at goal  Lab Results  Component Value Date   CHOL 145 11/07/2021   HDL 74 11/07/2021   LDLCALC 50 11/07/2021   LDLDIRECT 106.0 06/10/2012   TRIG 124 11/07/2021   CHOLHDL 2.0 11/07/2021

## 2021-12-14 NOTE — Patient Instructions (Addendum)
Your annual mammogram has been ordered AND IS DUE in October .   [facility] will not allow [place] to schedule it for you,  so please  call to make your appointment [phone]     X rays of cervical spine today to evaluate your alignment.  Consider having PT once the x rays are results   You can use diclofenac gel on the neck and shoulders.  I have sent a muscle relaxer to your pharmacy.  IT WILL MAKE YOU SLEEPY You can also  increase the Fioricet to a maximum of 4 daily  USE THE AIRPORT PILLOW TO SUPPORT YOUR NECK

## 2021-12-14 NOTE — Assessment & Plan Note (Signed)
Aggravated by neck pain.  Cervical spine films ordered.

## 2021-12-14 NOTE — Progress Notes (Unsigned)
Patient ID: Erin Aguilar, female    DOB: [birth year]  Age: 71 y.o. MRN: [id number]  The patient is here for annual preventive examination and management of other chronic and acute problems.   The risk factors are reflected in the social history.  The roster of all physicians providing medical care to patient - is listed in the Snapshot section of the chart.  Activities of daily living:  The patient is 100% independent in all ADLs: dressing, toileting, feeding as well as independent mobility  Home safety : The patient has smoke detectors in the home. They wear seatbelts.  There are no firearms at home. There is no violence in the home.   There is no risks for hepatitis, STDs or HIV. There is no   history of blood transfusion. They have no travel history to infectious disease endemic areas of the world.  The patient has seen their dentist in the last six month. They have seen their eye doctor in the last year. They admit to slight hearing difficulty with regard to whispered voices and some television programs.  They have deferred audiologic testing in the last year.  They do not  have excessive sun exposure. Discussed the need for sun protection: hats, long sleeves and use of sunscreen if there is significant sun exposure.   Diet: the importance of a healthy diet is discussed. They do have a healthy diet.  The benefits of regular aerobic exercise were discussed. She walks 4 times per week ,  20 minutes.   Depression screen: there are no signs or vegative symptoms of depression- irritability, change in appetite, anhedonia, sadness/tearfullness.  Cognitive assessment: the patient manages all their financial and personal affairs and is actively engaged. They could relate day,date,year and events; recalled 2/3 objects at 3 minutes; performed clock-face test normally.  The following portions of the patient's history were reviewed and updated as appropriate: allergies, current medications, past  family history, past medical history,  past surgical history, past social history  and problem list.  Visual acuity was not assessed per patient preference since she has regular follow up with her ophthalmologist. Hearing and body mass index were assessed and reviewed.   During the course of the visit the patient was educated and counseled about appropriate screening and preventive services including : fall prevention , diabetes screening, nutrition counseling, colorectal cancer screening, and recommended immunizations.    CC: The primary encounter diagnosis was Encounter for preventive health examination. Diagnoses of Breast cancer screening by mammogram, Plantar fasciitis, bilateral, Neck pain, Need for pneumococcal 20-valent conjugate vaccination, Chronic nonintractable headache, unspecified headache type, Atherosclerosis of aorta ([place]), and Obesity (BMI 30.0-34.9) were also pertinent to this visit.  1) neck pain,  constant radiates to center of head.  Aggravated by driving,  using fioricet which helps transiently.  No history of injury,  occupational overuse history   2) Obesity:  losing weight ,  down 20 lbs with use of GLP 1 agonist   History Shanija has a past medical history of Allergy, Arthritis, Chicken pox, Colon polyp, Diverticulitis, GERD (gastroesophageal reflux disease), H/O: pertussis, Headache(784.0), Hemochromatosis carrier, Hemorrhoids, internal, with bleeding, Hyperlipidemia, Migraines, and UTI (lower urinary tract infection).   She has a past surgical history that includes Cholecystectomy (1976); Abdominal hysterectomy (1988); Gastric bypass (03/2010); Hernia repair (2008); and Joint replacement (Bilateral, 02/26/2014).   Her family history includes Arthritis in her father, maternal grandfather, maternal grandmother, and mother; Cancer in her brother and mother; Cancer (age of onset: 22)  in her sister; Diabetes in her maternal grandfather and maternal grandmother; Early death  (age of onset: 71) in her mother; Heart disease in her maternal grandfather and maternal grandmother; Kidney disease in her brother; Stroke in her father.She reports that she quit smoking about 35 years ago. Her smoking use included cigarettes. She has never used smokeless tobacco. She reports current alcohol use of about 3.0 standard drinks of alcohol per week. She reports that she does not use drugs.  Outpatient Medications Prior to Visit  Medication Sig Dispense Refill   aspirin 81 MG chewable tablet Chew 81 mg by mouth.     atorvastatin (LIPITOR) 80 MG tablet TAKE 1 TABLET BY MOUTH ONCE DAILY. 90 tablet 0   busPIRone (BUSPAR) 10 MG tablet Take 1 tablet (10 mg total) by mouth 3 (three) times daily. 270 tablet 3   DULoxetine (CYMBALTA) 60 MG capsule TAKE (1) CAPSULE BY MOUTH ONCE DAILY. 90 capsule 1   escitalopram (LEXAPRO) 20 MG tablet TAKE 1 TABLET BY MOUTH ONCE DAILY. 90 tablet 0   esomeprazole (NEXIUM) 40 MG capsule TAKE 1 CAPSULE IN THE MORNING BEFORE BREAKFAST. 90 capsule 0   gabapentin (NEURONTIN) 100 MG capsule Take 1 capsule (100 mg total) by mouth 3 (three) times daily. Increase weekly by 100 mg if needed 90 capsule 3   Multiple Vitamin (MULTIVITAMIN) tablet Take 1 tablet by mouth daily.     ondansetron (ZOFRAN) 4 MG tablet Take 1 tablet (4 mg total) by mouth every 8 (eight) hours as needed for nausea or vomiting. 20 tablet 0   pramipexole (MIRAPEX) 0.25 MG tablet TAKE ONE TABLET BY MOUTH AT BEDTIME. 90 tablet 0   PREMARIN vaginal cream Place 1 Applicatorful vaginally. Twice weekly     Semaglutide, 1 MG/DOSE, 4 MG/3ML SOPN Inject 1 mg as directed once a week. 3 mL 2   butalbital-apap-caffeine-codeine (FIORICET WITH CODEINE) 50-325-40-30 MG capsule TAKE 1 CAPSULE BY MOUTH TWICE DAILY AS NEEDED FOR PAIN 60 capsule 5   Facility-Administered Medications Prior to Visit  Medication Dose Route Frequency Provider Last Rate Last Admin   0.9 %  sodium chloride infusion  500 mL Intravenous  Continuous Danis, Starr [place] III, MD        Review of Systems   Patient denies headache, fevers, malaise, unintentional weight loss, skin rash, eye pain, sinus congestion and sinus pain, sore throat, dysphagia,  hemoptysis , cough, dyspnea, wheezing, chest pain, palpitations, orthopnea, edema, abdominal pain, nausea, melena, diarrhea, constipation, flank pain, dysuria, hematuria, urinary  Frequency, nocturia, numbness, tingling, seizures,  Focal weakness, Loss of consciousness,  Tremor, insomnia, depression, anxiety, and suicidal ideation.     Objective:  BP 106/60 (BP Location: Left Arm, Patient Position: Sitting, Cuff Size: Large)   Pulse 61   Temp 97.9 F (36.6 C) (Oral)   Ht 5\' 2"  (1.575 m)   Wt 172 lb 9.6 oz (78.3 kg)   SpO2 97%   BMI 31.57 kg/m   Physical Exam  General appearance: alert, cooperative and appears stated age Head: Normocephalic, without obvious abnormality, atraumatic Eyes: conjunctivae/corneas clear. PERRL, EOM's intact. Fundi benign. Ears: normal TM's and external ear canals both ears Nose: Nares normal. Septum midline. Mucosa normal. No drainage or sinus tenderness. Throat: lips, mucosa, and tongue normal; teeth and gums normal Neck: no adenopathy, no carotid bruit, no JVD, supple, symmetrical, trachea midline and thyroid not enlarged, symmetric, no tenderness/mass/nodules Lungs: clear to auscultation bilaterally Breasts: normal appearance, no masses or tenderness Heart: regular rate and rhythm, S1,  S2 normal, no murmur, click, rub or gallop Abdomen: soft, non-tender; bowel sounds normal; no masses,  no organomegaly Extremities: extremities normal, atraumatic, no cyanosis or edema Pulses: 2+ and symmetric Skin: Skin color, texture, turgor normal. No rashes or lesions Neurologic: Alert and oriented X 3, normal strength and tone. Normal symmetric reflexes. Normal coordination and gait.    Assessment & Plan:   Problem List Items Addressed This Visit      Atherosclerosis of aorta (HCC)    Addressed previously,  Taking lipitor .  LDL is at goal  Lab Results  Component Value Date   CHOL 145 11/07/2021   HDL 74 11/07/2021   LDLCALC 50 11/07/2021   LDLDIRECT 106.0 06/10/2012   TRIG 124 11/07/2021   CHOLHDL 2.0 11/07/2021         Chronic headache disorder    Aggravated by neck pain.  Cervical spine films ordered.       Relevant Medications   butalbital-apap-caffeine-codeine (FIORICET WITH CODEINE) 50-325-40-30 MG capsule   tiZANidine (ZANAFLEX) 4 MG tablet   Encounter for preventive health examination - Primary    age appropriate education and counseling updated, referrals for preventative services and immunizations addressed, dietary and smoking counseling addressed, most recent labs reviewed.  I have personally reviewed and have noted:   1) the patient's medical and social history 2) The pt's use of alcohol, tobacco, and illicit drugs 3) The patient's current medications and supplements 4) Functional ability including ADL's, fall risk, home safety risk, hearing and visual impairment 5) Diet and physical activities 6) Evidence for depression or mood disorder 7) The patient's height, weight, and BMI have been recorded in the chart  I have made referrals, and provided counseling and education based on review of the above      Neck pain    Chronic,  Radiates to center of head.  Suspect degenerative changes in cervical spine .  Muscle relaxer,  Topical NSAID,  Fioricet. Prescribed. Plain films note issues with spurring and possible foraminal stenosis starting at  C5.  ,  Advised to consider PT pending results of x rays       Relevant Medications   butalbital-apap-caffeine-codeine (FIORICET WITH CODEINE) 50-325-40-30 MG capsule   Other Relevant Orders   DG Cervical Spine Complete (Completed)   Obesity (BMI 30.0-34.9)    She has lost 20 lbs since Feb using semaglutide.  I have congratulated her in reduction of   BMI and encouraged   Continued weight loss with goal of 10% of body weigh over the next 6 months using a low glycemic index diet and regular exercise a minimum of 5 days per week.        Plantar fasciitis, bilateral    Diagnosed years ago and managed initially with steroid injections,  For the last several years she has been managing  with arch supports from [employer].        Other Visit Diagnoses     Breast cancer screening by mammogram       Relevant Orders   MM 3D SCREEN BREAST BILATERAL   Need for pneumococcal 20-valent conjugate vaccination       Relevant Orders   Pneumococcal conjugate vaccine 20-valent (Prevnar 20) (Completed)       I have changed Steward Drone A. Goudeau's butalbital-apap-caffeine-codeine. I am also having her start on tiZANidine. Additionally, I am having her maintain her multivitamin, aspirin, Premarin, busPIRone, atorvastatin, escitalopram, esomeprazole, pramipexole, gabapentin, ondansetron, Semaglutide (1 MG/DOSE), and DULoxetine. We will continue to administer  sodium chloride.  Meds ordered this encounter  Medications   butalbital-apap-caffeine-codeine (FIORICET WITH CODEINE) 50-325-40-30 MG capsule    Sig: Take 1 capsule by mouth every 6 (six) hours as needed for headache.    Dispense:  120 capsule    Refill:  5   tiZANidine (ZANAFLEX) 4 MG tablet    Sig: Take 1 tablet (4 mg total) by mouth every 6 (six) hours as needed for muscle spasms.    Dispense:  30 tablet    Refill:  5    Medications Discontinued During This Encounter  Medication Reason   butalbital-apap-caffeine-codeine (FIORICET WITH CODEINE) 50-325-40-30 MG capsule     Follow-up: No follow-ups on file.   Crecencio Mc, MD

## 2021-12-14 NOTE — Assessment & Plan Note (Signed)
She has lost 20 lbs since Feb using semaglutide.  I have congratulated her in reduction of   BMI and encouraged  Continued weight loss with goal of 10% of body weigh over the next 6 months using a low glycemic index diet and regular exercise a minimum of 5 days per week.

## 2021-12-15 ENCOUNTER — Telehealth: Payer: Self-pay

## 2021-12-15 NOTE — Telephone Encounter (Signed)
PA for Ozempic has been submitted on covermymeds.  

## 2021-12-20 ENCOUNTER — Other Ambulatory Visit: Payer: Self-pay | Admitting: Internal Medicine

## 2021-12-20 ENCOUNTER — Telehealth: Payer: Self-pay

## 2021-12-20 MED ORDER — SEMAGLUTIDE-WEIGHT MANAGEMENT 1 MG/0.5ML ~~LOC~~ SOAJ: 1.0000 mg | SUBCUTANEOUS | 2 refills | Status: DC

## 2021-12-20 NOTE — Telephone Encounter (Signed)
Pt called about status of ozempic medication

## 2021-12-20 NOTE — Telephone Encounter (Signed)
PA for Ozempic has been denied because pt does not have a diagnosis of Diabetes.

## 2021-12-20 NOTE — Telephone Encounter (Signed)
PA for Ozempic has ben submitted on covermymeds.

## 2021-12-21 NOTE — Telephone Encounter (Signed)
Spoke with pt to let her know that a different medication has been sent in since the Ozempic has been denied. Pt gave a verbal understanding.

## 2021-12-26 NOTE — Telephone Encounter (Signed)
Pt called stating she did not get the other medication called vanga. Pt thinks that is the name of it

## 2021-12-27 ENCOUNTER — Ambulatory Visit (INDEPENDENT_AMBULATORY_CARE_PROVIDER_SITE_OTHER): Payer: Medicare PPO | Admitting: Family Medicine

## 2021-12-27 VITALS — BP 100/60 | HR 63 | Temp 97.3°F | Wt 174.1 lb

## 2021-12-27 DIAGNOSIS — R300 Dysuria: Secondary | ICD-10-CM | POA: Diagnosis not present

## 2021-12-27 DIAGNOSIS — N952 Postmenopausal atrophic vaginitis: Secondary | ICD-10-CM | POA: Insufficient documentation

## 2021-12-27 DIAGNOSIS — N3000 Acute cystitis without hematuria: Secondary | ICD-10-CM | POA: Diagnosis not present

## 2021-12-27 MED ORDER — CEFDINIR 300 MG PO CAPS: 300.0000 mg | ORAL_CAPSULE | Freq: Two times a day (BID) | ORAL | 0 refills | Status: AC

## 2021-12-27 NOTE — Assessment & Plan Note (Signed)
Encouraged adherence to vaginal estrogen to see if this reduces UTIs. However this is only her 2nd in a year time frame.

## 2021-12-27 NOTE — Patient Instructions (Signed)
Use the estrogen cream  Start antibiotics  Update if no improvement  We will follow-up the urine culture

## 2021-12-27 NOTE — Progress Notes (Signed)
   Subjective:     Erin Aguilar is a 71 y.o. female presenting for Dysuria (X 6 days. Pt took Azo, which gave false results on UA dip. Culture ordered. )     Dysuria  This is a new problem. The current episode started in the past 7 days. The quality of the pain is described as burning. The pain is moderate. There has been no fever. Pertinent negatives include no flank pain, frequency, hematuria, hesitancy, nausea, urgency or vomiting. Treatments tried: azo to help. The treatment provided no relief.   Hx of surgery for prolapsed bladder  Has estrogen vaginal and is not consistent   Review of Systems  Gastrointestinal:  Negative for nausea and vomiting.  Genitourinary:  Positive for dysuria. Negative for flank pain, frequency, hematuria, hesitancy and urgency.     Social History   Tobacco Use  Smoking Status Former   Types: Cigarettes   Quit date: 12/08/1986   Years since quitting: 35.0  Smokeless Tobacco Never        Objective:    BP Readings from Last 3 Encounters:  12/27/21 100/60  12/14/21 106/60  11/07/21 100/68   Wt Readings from Last 3 Encounters:  12/27/21 174 lb 2 oz (79 kg)  12/14/21 172 lb 9.6 oz (78.3 kg)  11/07/21 175 lb 12.8 oz (79.7 kg)    BP 100/60   Pulse 63   Temp (!) 97.3 F (36.3 C) (Temporal)   Wt 174 lb 2 oz (79 kg)   SpO2 96%   BMI 31.85 kg/m    Physical Exam Constitutional:      General: She is not in acute distress.    Appearance: She is well-developed. She is not diaphoretic.  HENT:     Head: Normocephalic and atraumatic.  Eyes:     Conjunctiva/sclera: Conjunctivae normal.  Cardiovascular:     Rate and Rhythm: Normal rate and regular rhythm.     Heart sounds: Normal heart sounds.  Pulmonary:     Effort: Pulmonary effort is normal.  Abdominal:     General: Bowel sounds are normal. There is no distension.     Palpations: Abdomen is soft.     Tenderness: There is no abdominal tenderness. There is no guarding.   Musculoskeletal:     Cervical back: Neck supple.  Skin:    General: Skin is warm and dry.  Neurological:     Mental Status: She is alert.           Assessment & Plan:   Problem List Items Addressed This Visit       Genitourinary   Vaginal atrophy    Encouraged adherence to vaginal estrogen to see if this reduces UTIs. However this is only her 2nd in a year time frame.       Other Visit Diagnoses     Acute cystitis without hematuria    -  Primary   Relevant Medications   cefdinir (OMNICEF) 300 MG capsule   Dysuria       Relevant Orders   Urine Culture      UA not done due to hx of azo and would not be accurate. Based on symptoms and prior UTI in February will treat for likely UTI with cefdinir - reviewed last culture.   Will f/u Ucx and adjust as needed.   Return if symptoms worsen or fail to improve.  Lynnda Child, MD

## 2021-12-29 DIAGNOSIS — B338 Other specified viral diseases: Secondary | ICD-10-CM | POA: Diagnosis not present

## 2021-12-29 NOTE — Telephone Encounter (Signed)
Spoke with Erin Aguilar to clarigy what medication she was talking about. Erin Aguilar stated that she is talking about the wegovy.

## 2021-12-30 LAB — URINE CULTURE
MICRO NUMBER:: 13905629
SPECIMEN QUALITY:: ADEQUATE

## 2022-01-03 NOTE — Telephone Encounter (Signed)
PA for Wegovy has been submitted. ?

## 2022-01-18 ENCOUNTER — Ambulatory Visit (INDEPENDENT_AMBULATORY_CARE_PROVIDER_SITE_OTHER): Payer: Medicare PPO

## 2022-01-18 VITALS — Ht 62.0 in | Wt 174.0 lb

## 2022-01-18 DIAGNOSIS — Z0000 Encounter for general adult medical examination without abnormal findings: Secondary | ICD-10-CM

## 2022-01-18 NOTE — Progress Notes (Addendum)
Subjective:   Erin Aguilar is a 71 y.o. female who presents for Medicare Annual (Subsequent) preventive examination.  Review of Systems    No ROS.  Medicare Wellness Virtual Visit.  Visual/audio telehealth visit, UTA vital signs.   See social history for additional risk factors.   Cardiac Risk Factors include: advanced age (>74men, >76 women)     Objective:    Today's Vitals   01/18/22 1104  Weight: 174 lb (78.9 kg)  Height: 5\' 2"  (1.575 m)   Body mass index is 31.83 kg/m.     01/18/2022   10:55 AM 01/10/2021    3:40 PM 01/08/2020    1:58 PM 12/25/2018   11:11 AM 12/10/2017   10:13 AM 09/12/2017    9:38 AM 12/06/2016    9:13 AM  Advanced Directives  Does Patient Have a Medical Advance Directive? Yes Yes Yes Yes Yes Yes Yes  Type of Paramedic of [place];Living will  [place];Living will [place];Living will Living will;Healthcare Power of [place];Living will [place];Living will  Does patient want to make changes to medical advance directive? No - Patient declined No - Patient declined No - Patient declined No - Patient declined No - Patient declined  No - Patient declined  Copy of [place] in Chart? Yes - validated most recent copy scanned in chart (See row information)  Yes - validated most recent copy scanned in chart (See row information) Yes - validated most recent copy scanned in chart (See row information) Yes Yes No - copy requested    Current Medications (verified) Outpatient Encounter Medications as of 01/18/2022  Medication Sig   aspirin 81 MG chewable tablet Chew 81 mg by mouth.   atorvastatin (LIPITOR) 80 MG tablet TAKE 1 TABLET BY MOUTH ONCE DAILY.   busPIRone (BUSPAR) 10 MG tablet Take 1 tablet (10 mg total) by mouth 3 (three) times daily.   butalbital-apap-caffeine-codeine (FIORICET WITH CODEINE) 50-325-40-30 MG capsule Take 1  capsule by mouth every 6 (six) hours as needed for headache.   DULoxetine (CYMBALTA) 60 MG capsule TAKE (1) CAPSULE BY MOUTH ONCE DAILY.   escitalopram (LEXAPRO) 20 MG tablet TAKE 1 TABLET BY MOUTH ONCE DAILY.   esomeprazole (NEXIUM) 40 MG capsule TAKE 1 CAPSULE IN THE MORNING BEFORE BREAKFAST.   gabapentin (NEURONTIN) 100 MG capsule Take 1 capsule (100 mg total) by mouth 3 (three) times daily. Increase weekly by 100 mg if needed   Multiple Vitamin (MULTIVITAMIN) tablet Take 1 tablet by mouth daily.   ondansetron (ZOFRAN) 4 MG tablet Take 1 tablet (4 mg total) by mouth every 8 (eight) hours as needed for nausea or vomiting.   pramipexole (MIRAPEX) 0.25 MG tablet TAKE ONE TABLET BY MOUTH AT BEDTIME.   PREMARIN vaginal cream Place 1 Applicatorful vaginally. Twice weekly   Semaglutide, 1 MG/DOSE, 4 MG/3ML SOPN Inject 1 mg as directed once a week.   [START ON 02/16/2022] Semaglutide-Weight Management 1 MG/0.5ML SOAJ Inject 1 mg into the skin once a week for 28 days.   tiZANidine (ZANAFLEX) 4 MG tablet Take 1 tablet (4 mg total) by mouth every 6 (six) hours as needed for muscle spasms.   Facility-Administered Encounter Medications as of 01/18/2022  Medication   0.9 %  sodium chloride infusion    Allergies (verified) Ciprofloxacin, Oxycodone, and Trazodone and nefazodone   History: Past Medical History:  Diagnosis Date   Allergy  Hay fever   Arthritis    Chicken pox    Colon polyp    Diverticulitis    GERD (gastroesophageal reflux disease)    H/O: pertussis    Headache(784.0)    Hemochromatosis carrier    sister has disease   Hemorrhoids, internal, with bleeding    occasional   Hyperlipidemia    Migraines    UTI (lower urinary tract infection)    Past Surgical History:  Procedure Laterality Date   [place]   GASTRIC BYPASS  03/2010   HERNIA REPAIR  2008   ventral ,  from scar tissue, [place]   JOINT REPLACEMENT Bilateral 02/26/2014    Dr. Smith Mince   Family History  Problem Relation Age of Onset   Arthritis Mother        rheumatoid    Cancer Mother        lymphoma   Early death Mother 54       lymphoma   Arthritis Father    Stroke Father    Cancer Sister 54       cervical ca   Kidney disease Brother    Cancer Brother        lung ca,  smoker   Arthritis Maternal Grandmother    Heart disease Maternal Grandmother    Diabetes Maternal Grandmother    Arthritis Maternal Grandfather    Heart disease Maternal Grandfather    Diabetes Maternal Grandfather    Breast cancer Neg Hx    Social History   Socioeconomic History   Marital status: Married    Spouse name: Not on file   Number of children: 3   Years of education: Not on file   Highest education level: Not on file  Occupational History   Occupation: retired  Tobacco Use   Smoking status: Former    Types: Cigarettes    Quit date: 12/08/1986    Years since quitting: 35.1   Smokeless tobacco: Never  Vaping Use   Vaping Use: Never used  Substance and Sexual Activity   Alcohol use: Yes    Alcohol/week: 3.0 standard drinks of alcohol    Types: 3 Standard drinks or equivalent per week   Drug use: No   Sexual activity: Not on file  Other Topics Concern   Not on file  Social History Narrative   Not on file   Social Determinants of Health   Financial Resource Strain: Low Risk  (01/18/2022)   Overall Financial Resource Strain (CARDIA)    Difficulty of Paying Living Expenses: Not hard at all  Food Insecurity: No Food Insecurity (01/18/2022)   Hunger Vital Sign    Worried About Running Out of Food in the Last Year: Never true    Ran Out of Food in the Last Year: Never true  Transportation Needs: No Transportation Needs (01/18/2022)   PRAPARE - Hydrologist (Medical): No    Lack of Transportation (Non-Medical): No  Physical Activity: Insufficiently Active (01/18/2022)   Exercise Vital Sign    Days of Exercise per Week:  4 days    Minutes of Exercise per Session: 30 min  Stress: No Stress Concern Present (01/18/2022)   [place]    Feeling of Stress : Not at all  Social Connections: Unknown (01/18/2022)   Social Connection and Isolation Panel [NHANES]    Frequency of Communication with Friends and Family: Not on file  Frequency of Social Gatherings with Friends and Family: Not on file    Attends Religious Services: Not on file    Active Member of Clubs or Organizations: Not on file    Attends Archivist Meetings: Not on file    Marital Status: Married    Tobacco Counseling Counseling given: Not Answered   Clinical Intake:  Pre-visit preparation completed: Yes        Diabetes: No  How often do you need to have someone help you when you read instructions, pamphlets, or other written materials from your doctor or pharmacy?: 1 - Never Interpreter Needed?: No      Activities of Daily Living    01/18/2022   11:11 AM  In your present state of health, do you have any difficulty performing the following activities:  Hearing? 0  Vision? 0  Difficulty concentrating or making decisions? 0  Walking or climbing stairs? 0  Dressing or bathing? 0  Doing errands, shopping? 0  Preparing Food and eating ? N  Using the Toilet? N  In the past six months, have you accidently leaked urine? Y  Comment Managed with daily liner  Do you have problems with loss of bowel control? N  Managing your Medications? N  Managing your Finances? N  Housekeeping or managing your Housekeeping? N    Patient Care Team: Crecencio Mc, MD as PCP - General (Internal Medicine)  Indicate any recent Medical Services you may have received from other than [facility] providers in the past year (date may be approximate).     Assessment:   This is a routine wellness examination for [place].  I connected with  Georges Mouse on 01/18/22 by a audio  enabled telemedicine application and verified that I am speaking with the correct person using two identifiers.  Patient Location: Home  Provider Location: Office/Clinic  I discussed the limitations of evaluation and management by telemedicine. The patient expressed understanding and agreed to proceed.   Hearing/Vision screen Hearing Screening - Comments:: Patient is able to hear conversational tones without difficulty. No issues reported. Vision Screening - Comments:: Followed by [facility]) Wears corrective lenses They have regular follow up with the ophthalmologist  Dietary issues and exercise activities discussed: Current Exercise Habits: Home exercise routine, Type of exercise: walking, Time (Minutes): 30, Frequency (Times/Week): 4, Weekly Exercise (Minutes/Week): 120, Intensity: Mild Healthy diet Good fluid intake   Goals Addressed               This Visit's Progress     Patient Stated     DIET - INCREASE WATER INTAKE (pt-stated)   On track     Other     Maintain Healthy Lifestyle        Stay active Healthy diet       Depression Screen    01/18/2022   11:00 AM 12/14/2021    8:21 AM 11/07/2021   10:44 AM 06/13/2021    9:33 AM 03/01/2021   10:13 AM 01/10/2021    3:37 PM 12/09/2020    9:25 AM  PHQ 2/9 Scores  PHQ - 2 Score 0 1 0 2 0 0 2  PHQ- 9 Score 0 1  12  0 5    Fall Risk    01/18/2022   11:00 AM 12/14/2021    8:14 AM 11/07/2021   10:44 AM 08/08/2021   11:37 AM 06/13/2021    9:33 AM  Fall Risk   Falls in the past year?  0 0 0 0 0  Number falls in past yr: 0      Injury with Fall? 0      Risk for fall due to : No Fall Risks No Fall Risks No Fall Risks No Fall Risks No Fall Risks  Follow up Falls evaluation completed Falls evaluation completed Falls evaluation completed Falls evaluation completed Falls evaluation completed    [place]: Home free of loose throw rugs in walkways, pet beds,  electrical cords, etc? Yes  Adequate lighting in your home to reduce risk of falls? Yes   ASSISTIVE DEVICES UTILIZED TO PREVENT FALLS: Life alert? No  Use of a cane, walker or w/c? No  Grab bars in the bathroom? Yes  Shower chair or bench in shower? No  Comfort chair height toilet? Yes   TIMED UP AND GO: Was the test performed? No .   Cognitive Function:    12/10/2017   10:18 AM 12/06/2016    9:35 AM  MMSE - Mini Mental State Exam  Orientation to time 5 5  Orientation to Place 5 5  Registration 3 3  Attention/ Calculation 5 5  Recall 3 3  Language- name 2 objects 2   Language- repeat 1 1  Language- follow 3 step command 3   Language- read & follow direction 1 1  Write a sentence 1   Copy design 1 1  Total score 30         01/18/2022   11:12 AM 01/10/2021    3:43 PM 12/25/2018   11:15 AM  6CIT Screen  What Year? 0 points 0 points 0 points  What month? 0 points 0 points 0 points  What time? 0 points 0 points 0 points  Count back from 20 0 points 0 points 0 points  Months in reverse 0 points 0 points 0 points  Repeat phrase 0 points  0 points  Total Score 0 points  0 points    Immunizations Immunization History  Administered Date(s) Administered   Hepatitis A, Adult 02/20/2013   Hepatitis B 07/24/1994, 08/23/1994, 01/23/1995   Influenza, High Dose Seasonal PF 01/12/2016, 12/06/2016   Influenza,inj,Quad PF,6+ Mos 12/14/2014   Influenza,inj,quad, With Preservative 01/15/2018, 02/03/2019   Influenza-Unspecified 01/21/2013, 01/16/2020, 01/12/2021   Moderna Covid-19 Vaccine Bivalent Booster 5yrs & up 01/12/2021   Moderna Sars-Covid-2 Vaccination 04/28/2019, 05/26/2019, 02/19/2020   PNEUMOCOCCAL CONJUGATE-20 12/14/2021   Pneumococcal Conjugate-13 01/22/2013   Pneumococcal Polysaccharide-23 07/27/2014   Tdap 01/22/2013   Zoster Recombinat (Shingrix) 10/31/2017, 01/03/2018   Zoster, Live 01/23/2012   Covid-19 vaccine status: Completed vaccines x4.  Screening  Tests Health Maintenance  Topic Date Due   COVID-19 Vaccine (5 - Moderna series) 02/03/2022 (Originally 05/14/2021)   INFLUENZA VACCINE  07/16/2022 (Originally 11/15/2021)   MAMMOGRAM  01/20/2022   TETANUS/TDAP  01/23/2023   COLONOSCOPY (Pts 45-65yrs Insurance coverage will need to be confirmed)  12/28/2025   Pneumonia Vaccine 42+ Years old  Completed   DEXA SCAN  Completed   Hepatitis C Screening  Completed   Zoster Vaccines- Shingrix  Completed   HPV VACCINES  Aged Out   Health Maintenance There are no preventive care reminders to display for this patient.  Lung Cancer Screening: (Low Dose CT Chest recommended if Age 30-80 years, 30 pack-year currently smoking OR have quit w/in 15years.) does not qualify.   Hepatitis C Screening: Completed 2017.  Vision Screening: Recommended annual ophthalmology exams for early detection of glaucoma and other disorders of  the eye.  Dental Screening: Recommended annual dental exams for proper oral hygiene  Community Resource Referral / Chronic Care Management: CRR required this visit?  No   CCM required this visit?  No      Plan:     I have personally reviewed and noted the following in the patient's chart:   Medical and social history Use of alcohol, tobacco or illicit drugs  Current medications and supplements including opioid prescriptions. Patient is currently taking opioid prescriptions. Information provided to patient regarding non-opioid alternatives. Patient advised to discuss non-opioid treatment plan with their provider. Followed by PCP. Functional ability and status Nutritional status Physical activity Advanced directives List of other physicians Hospitalizations, surgeries, and ER visits in previous 12 months Vitals Screenings to include cognitive, depression, and falls Referrals and appointments  In addition, I have reviewed and discussed with patient certain preventive protocols, quality metrics, and best practice  recommendations. A written personalized care plan for preventive services as well as general preventive health recommendations were provided to patient.     OBrien-Blaney, Lailie Smead L, LPN   [id number]     I have reviewed the above information and agree with above.   Deborra Medina, MD

## 2022-01-18 NOTE — Patient Instructions (Addendum)
Erin Aguilar , Thank you for taking time to come for your Medicare Wellness Visit. I appreciate your ongoing commitment to your health goals. Please review the following plan we discussed and let me know if I can assist you in the future.   These are the goals we discussed:  Goals       Patient Stated     DIET - INCREASE WATER INTAKE (pt-stated)      Other     Maintain Healthy Lifestyle      Stay active Healthy diet        This is a list of the screening recommended for you and due dates:  Health Maintenance  Topic Date Due   COVID-19 Vaccine (5 - Moderna series) 02/03/2022*   Flu Shot  07/16/2022*   Mammogram  01/20/2022   Tetanus Vaccine  01/23/2023   Colon Cancer Screening  12/28/2025   Pneumonia Vaccine  Completed   DEXA scan (bone density measurement)  Completed   Hepatitis C Screening: USPSTF Recommendation to screen - Ages 30-79 yo.  Completed   Zoster (Shingles) Vaccine  Completed   HPV Vaccine  Aged Out  *Topic was postponed. The date shown is not the original due date.    Advanced directives: on file  Conditions/risks identified: none new  Next appointment: Follow up in one year for your annual wellness visit    Preventive Care 65 Years and Older, Female Preventive care refers to lifestyle choices and visits with your health care provider that can promote health and wellness. What does preventive care include? A yearly physical exam. This is also called an annual well check. Dental exams once or twice a year. Routine eye exams. Ask your health care provider how often you should have your eyes checked. Personal lifestyle choices, including: Daily care of your teeth and gums. Regular physical activity. Eating a healthy diet. Avoiding tobacco and drug use. Limiting alcohol use. Practicing safe sex. Taking low-dose aspirin every day. Taking vitamin and mineral supplements as recommended by your health care provider. What happens during an annual well  check? The services and screenings done by your health care provider during your annual well check will depend on your age, overall health, lifestyle risk factors, and family history of disease. Counseling  Your health care provider may ask you questions about your: Alcohol use. Tobacco use. Drug use. Emotional well-being. Home and relationship well-being. Sexual activity. Eating habits. History of falls. Memory and ability to understand (cognition). Work and work Statistician. Reproductive health. Screening  You may have the following tests or measurements: Height, weight, and BMI. Blood pressure. Lipid and cholesterol levels. These may be checked every 5 years, or more frequently if you are over 63 years old. Skin check. Lung cancer screening. You may have this screening every year starting at age 75 if you have a 30-pack-year history of smoking and currently smoke or have quit within the past 15 years. Fecal occult blood test (FOBT) of the stool. You may have this test every year starting at age 2. Flexible sigmoidoscopy or colonoscopy. You may have a sigmoidoscopy every 5 years or a colonoscopy every 10 years starting at age 62. Hepatitis C blood test. Hepatitis B blood test. Sexually transmitted disease (STD) testing. Diabetes screening. This is done by checking your blood sugar (glucose) after you have not eaten for a while (fasting). You may have this done every 1-3 years. Bone density scan. This is done to screen for osteoporosis. You may have this done  starting at age 39. Mammogram. This may be done every 1-2 years. Talk to your health care provider about how often you should have regular mammograms. Talk with your health care provider about your test results, treatment options, and if necessary, the need for more tests. Vaccines  Your health care provider may recommend certain vaccines, such as: Influenza vaccine. This is recommended every year. Tetanus, diphtheria, and  acellular pertussis (Tdap, Td) vaccine. You may need a Td booster every 10 years. Zoster vaccine. You may need this after age 46. Pneumococcal 13-valent conjugate (PCV13) vaccine. One dose is recommended after age 18. Pneumococcal polysaccharide (PPSV23) vaccine. One dose is recommended after age 13. Talk to your health care provider about which screenings and vaccines you need and how often you need them. This information is not intended to replace advice given to you by your health care provider. Make sure you discuss any questions you have with your health care provider. Document Released: 04/30/2015 Document Revised: 12/22/2015 Document Reviewed: 02/02/2015 Elsevier Interactive Patient Education  2017 [place] Prevention in the Home Falls can cause injuries. They can happen to people of all ages. There are many things you can do to make your home safe and to help prevent falls. What can I do on the outside of my home? Regularly fix the edges of walkways and driveways and fix any cracks. Remove anything that might make you trip as you walk through a door, such as a raised step or threshold. Trim any bushes or trees on the path to your home. Use bright outdoor lighting. Clear any walking paths of anything that might make someone trip, such as rocks or tools. Regularly check to see if handrails are loose or broken. Make sure that both sides of any steps have handrails. Any raised decks and porches should have guardrails on the edges. Have any leaves, snow, or ice cleared regularly. Use sand or salt on walking paths during winter. Clean up any spills in your garage right away. This includes oil or grease spills. What can I do in the bathroom? Use night lights. Install grab bars by the toilet and in the tub and shower. Do not use towel bars as grab bars. Use non-skid mats or decals in the tub or shower. If you need to sit down in the shower, use a plastic, non-slip stool. Keep  the floor dry. Clean up any water that spills on the floor as soon as it happens. Remove soap buildup in the tub or shower regularly. Attach bath mats securely with double-sided non-slip rug tape. Do not have throw rugs and other things on the floor that can make you trip. What can I do in the bedroom? Use night lights. Make sure that you have a light by your bed that is easy to reach. Do not use any sheets or blankets that are too big for your bed. They should not hang down onto the floor. Have a firm chair that has side arms. You can use this for support while you get dressed. Do not have throw rugs and other things on the floor that can make you trip. What can I do in the kitchen? Clean up any spills right away. Avoid walking on wet floors. Keep items that you use a lot in easy-to-reach places. If you need to reach something above you, use a strong step stool that has a grab bar. Keep electrical cords out of the way. Do not use floor polish or wax that  makes floors slippery. If you must use wax, use non-skid floor wax. Do not have throw rugs and other things on the floor that can make you trip. What can I do with my stairs? Do not leave any items on the stairs. Make sure that there are handrails on both sides of the stairs and use them. Fix handrails that are broken or loose. Make sure that handrails are as long as the stairways. Check any carpeting to make sure that it is firmly attached to the stairs. Fix any carpet that is loose or worn. Avoid having throw rugs at the top or bottom of the stairs. If you do have throw rugs, attach them to the floor with carpet tape. Make sure that you have a light switch at the top of the stairs and the bottom of the stairs. If you do not have them, ask someone to add them for you. What else can I do to help prevent falls? Wear shoes that: Do not have high heels. Have rubber bottoms. Are comfortable and fit you well. Are closed at the toe. Do not  wear sandals. If you use a stepladder: Make sure that it is fully opened. Do not climb a closed stepladder. Make sure that both sides of the stepladder are locked into place. Ask someone to hold it for you, if possible. Clearly mark and make sure that you can see: Any grab bars or handrails. First and last steps. Where the edge of each step is. Use tools that help you move around (mobility aids) if they are needed. These include: Canes. Walkers. Scooters. Crutches. Turn on the lights when you go into a dark area. Replace any light bulbs as soon as they burn out. Set up your furniture so you have a clear path. Avoid moving your furniture around. If any of your floors are uneven, fix them. If there are any pets around you, be aware of where they are. Review your medicines with your doctor. Some medicines can make you feel dizzy. This can increase your chance of falling. Ask your doctor what other things that you can do to help prevent falls. This information is not intended to replace advice given to you by your health care provider. Make sure you discuss any questions you have with your health care provider. Document Released: 01/28/2009 Document Revised: 09/09/2015 Document Reviewed: 05/08/2014 Elsevier Interactive Patient Education  2017 [place].  Opioid Pain Medicine Management Opioids are powerful medicines that are used to treat moderate to severe pain. When used for short periods of time, they can help you to: Sleep better. Do better in physical or occupational therapy. Feel better in the first few days after an injury. Recover from surgery. Opioids should be taken with the supervision of a trained health care provider. They should be taken for the shortest period of time possible. This is because opioids can be addictive, and the longer you take opioids, the greater your risk of addiction. This addiction can also be called opioid use disorder. What are the risks? Using  opioid pain medicines for longer than 3 days increases your risk of side effects. Side effects include: Constipation. Nausea and vomiting. Breathing difficulties (respiratory depression). Drowsiness. Confusion. Opioid use disorder. Itching. Taking opioid pain medicine for a long period of time can affect your ability to do daily tasks. It also puts you at risk for: Motor vehicle crashes. Depression. Suicide. Heart attack. Overdose, which can be life-threatening. What is a pain treatment plan? A pain treatment  plan is an agreement between you and your health care provider. Pain is unique to each person, and treatments vary depending on your condition. To manage your pain, you and your health care provider need to work together. To help you do this: Discuss the goals of your treatment, including how much pain you might expect to have and how you will manage the pain. Review the risks and benefits of taking opioid medicines. Remember that a good treatment plan uses more than one approach and minimizes the chance of side effects. Be honest about the amount of medicines you take and about any drug or alcohol use. Get pain medicine prescriptions from only one health care provider. Pain can be managed with many types of alternative treatments. Ask your health care provider to refer you to one or more specialists who can help you manage pain through: Physical or occupational therapy. Counseling (cognitive behavioral therapy). Good nutrition. Biofeedback. Massage. Meditation. Non-opioid medicine. Following a gentle exercise program. How to use opioid pain medicine Taking medicine Take your pain medicine exactly as told by your health care provider. Take it only when you need it. If your pain gets less severe, you may take less than your prescribed dose if your health care provider approves. If you are not having pain, do nottake pain medicine unless your health care provider tells you to  take it. If your pain is severe, do nottry to treat it yourself by taking more pills than instructed on your prescription. Contact your health care provider for help. Write down the times when you take your pain medicine. It is easy to become confused while on pain medicine. Writing the time can help you avoid overdose. Take other over-the-counter or prescription medicines only as told by your health care provider. Keeping yourself and others safe  While you are taking opioid pain medicine: Do not drive, use machinery, or power tools. Do not sign legal documents. Do not drink alcohol. Do not take sleeping pills. Do not supervise children by yourself. Do not do activities that require climbing or being in high places. Do not go to a lake, river, ocean, spa, or swimming pool. Do not share your pain medicine with anyone. Keep pain medicine in a locked cabinet or in a secure area where pets and children cannot reach it. Stopping your use of opioids If you have been taking opioid medicine for more than a few weeks, you may need to slowly decrease (taper) how much you take until you stop completely. Tapering your use of opioids can decrease your risk of symptoms of withdrawal, such as: Pain and cramping in the abdomen. Nausea. Sweating. Sleepiness. Restlessness. Uncontrollable shaking (tremors). Cravings for the medicine. Do not attempt to taper your use of opioids on your own. Talk with your health care provider about how to do this. Your health care provider may prescribe a step-down schedule based on how much medicine you are taking and how long you have been taking it. Getting rid of leftover pills Do not save any leftover pills. Get rid of leftover pills safely by: Taking the medicine to a prescription take-back program. This is usually offered by the county or law enforcement. Bringing them to a pharmacy that has a drug disposal container. Flushing them down the toilet. Check the label  or package insert of your medicine to see whether this is safe to do. Throwing them out in the trash. Check the label or package insert of your medicine to see whether this is  safe to do. If it is safe to throw it out, remove the medicine from the original container, put it into a sealable bag or container, and mix it with used coffee grounds, food scraps, dirt, or cat litter before putting it in the trash. Follow these instructions at home: Activity Do exercises as told by your health care provider. Avoid activities that make your pain worse. Return to your normal activities as told by your health care provider. Ask your health care provider what activities are safe for you. General instructions You may need to take these actions to prevent or treat constipation: Drink enough fluid to keep your urine pale yellow. Take over-the-counter or prescription medicines. Eat foods that are high in fiber, such as beans, whole grains, and fresh fruits and vegetables. Limit foods that are high in fat and processed sugars, such as fried or sweet foods. Keep all follow-up visits. This is important. Where to find support If you have been taking opioids for a long time, you may benefit from receiving support for quitting from a local support group or counselor. Ask your health care provider for a referral to these resources in your area. Where to find more information Centers for Disease Control and Prevention (CDC): http://www.wolf.info/ [place] Food and Drug Administration (FDA): [place] Get help right away if: You may have taken too much of an opioid (overdosed). Common symptoms of an overdose: Your breathing is slower or more shallow than normal. You have a very slow heartbeat (pulse). You have slurred speech. You have nausea and vomiting. Your pupils become very small. You have other potential symptoms: You are very confused. You faint or feel like you will faint. You have cold, clammy skin. You have blue  lips or fingernails. You have thoughts of harming yourself or harming others. These symptoms may represent a serious problem that is an emergency. Do not wait to see if the symptoms will go away. Get medical help right away. Call your local emergency services (911 in the [place] Do not drive yourself to the hospital.  If you ever feel like you may hurt yourself or others, or have thoughts about taking your own life, get help right away. Go to your nearest emergency department or: Call your local emergency services (911 in the [place] Call the [facility] (301)786-5036 in the [place] Call a suicide crisis helpline, such as the [place] at 437-852-0688 or 988 in the [place]. This is open 24 hours a day in the [place] Text the Crisis Text Line at 608-424-3197 (in the [place].). Summary Opioid medicines can help you manage moderate to severe pain for a short period of time. A pain treatment plan is an agreement between you and your health care provider. Discuss the goals of your treatment, including how much pain you might expect to have and how you will manage the pain. If you think that you or someone else may have taken too much of an opioid, get medical help right away. This information is not intended to replace advice given to you by your health care provider. Make sure you discuss any questions you have with your health care provider. Document Revised: 10/27/2020 Document Reviewed: 07/14/2020 Elsevier Patient Education  [employer].

## 2022-01-19 ENCOUNTER — Telehealth: Payer: Self-pay

## 2022-01-19 DIAGNOSIS — H34831 Tributary (branch) retinal vein occlusion, right eye, with macular edema: Secondary | ICD-10-CM | POA: Diagnosis not present

## 2022-01-19 NOTE — Telephone Encounter (Signed)
PA for Egovy has been submitted on covermyemds.

## 2022-01-23 DIAGNOSIS — Z859 Personal history of malignant neoplasm, unspecified: Secondary | ICD-10-CM | POA: Diagnosis not present

## 2022-01-23 DIAGNOSIS — L570 Actinic keratosis: Secondary | ICD-10-CM | POA: Diagnosis not present

## 2022-01-23 DIAGNOSIS — Z872 Personal history of diseases of the skin and subcutaneous tissue: Secondary | ICD-10-CM | POA: Diagnosis not present

## 2022-01-23 DIAGNOSIS — Z86018 Personal history of other benign neoplasm: Secondary | ICD-10-CM | POA: Diagnosis not present

## 2022-01-23 DIAGNOSIS — L578 Other skin changes due to chronic exposure to nonionizing radiation: Secondary | ICD-10-CM | POA: Diagnosis not present

## 2022-01-25 ENCOUNTER — Telehealth: Payer: Self-pay | Admitting: Internal Medicine

## 2022-01-25 MED ORDER — ESOMEPRAZOLE MAGNESIUM 40 MG PO CPDR: 40.0000 mg | DELAYED_RELEASE_CAPSULE | Freq: Every day | ORAL | 3 refills | Status: DC

## 2022-01-25 MED ORDER — GABAPENTIN 100 MG PO CAPS: 100.0000 mg | ORAL_CAPSULE | Freq: Three times a day (TID) | ORAL | 3 refills | Status: DC

## 2022-01-25 MED ORDER — ATORVASTATIN CALCIUM 80 MG PO TABS: 80.0000 mg | ORAL_TABLET | Freq: Every day | ORAL | 3 refills | Status: DC

## 2022-01-25 MED ORDER — ESCITALOPRAM OXALATE 20 MG PO TABS: 20.0000 mg | ORAL_TABLET | Freq: Every day | ORAL | 1 refills | Status: DC

## 2022-01-25 NOTE — Telephone Encounter (Signed)
Spoke with pt to let her know that her medications have been refilled and that her wegovy rx has been approved through her insurance.

## 2022-01-25 NOTE — Telephone Encounter (Signed)
Patient is requesting the following refills; atorvastatin (LIPITOR) 80 MG tablet, escitalopram (LEXAPRO) 20 MG tablet, and esomeprazole (NEXIUM) 40 MG capsule.  Also patient needs her gabapentin (NEURONTIN) 100 MG capsule

## 2022-01-25 NOTE — Addendum Note (Signed)
Addended by: Adair Laundry on: 01/25/2022 01:46 PM   Modules accepted: Orders

## 2022-02-04 ENCOUNTER — Other Ambulatory Visit: Payer: Self-pay | Admitting: Internal Medicine

## 2022-02-07 ENCOUNTER — Ambulatory Visit
Admission: RE | Admit: 2022-02-07 | Discharge: 2022-02-07 | Disposition: A | Discharge status: Home / Self Care | Payer: Medicare PPO | Source: Ambulatory Visit | Attending: Internal Medicine | Admitting: Internal Medicine

## 2022-02-07 ENCOUNTER — Other Ambulatory Visit: Payer: Self-pay

## 2022-02-07 DIAGNOSIS — M542 Cervicalgia: Secondary | ICD-10-CM

## 2022-02-07 DIAGNOSIS — Z1231 Encounter for screening mammogram for malignant neoplasm of breast: Secondary | ICD-10-CM | POA: Insufficient documentation

## 2022-02-07 MED ORDER — BUTALBITAL-APAP-CAFF-COD 50-325-40-30 MG PO CAPS: 1.0000 | ORAL_CAPSULE | Freq: Four times a day (QID) | ORAL | 5 refills | Status: DC | PRN

## 2022-02-07 NOTE — Telephone Encounter (Signed)
Refilled: 12/14/2021 Last OV: 12/14/2021 Next OV: 06/15/2022

## 2022-02-08 ENCOUNTER — Ambulatory Visit: Payer: Medicare PPO | Admitting: Internal Medicine

## 2022-02-22 ENCOUNTER — Telehealth: Payer: Self-pay | Admitting: Internal Medicine

## 2022-02-22 DIAGNOSIS — R300 Dysuria: Secondary | ICD-10-CM | POA: Diagnosis not present

## 2022-02-22 DIAGNOSIS — N390 Urinary tract infection, site not specified: Secondary | ICD-10-CM | POA: Diagnosis not present

## 2022-02-22 NOTE — Telephone Encounter (Signed)
Spoke with pt to let her know that we do not have any available appts and that Dr. Darrick Huntsman will be out of town the rest of the week. Pt was advised to go to [facility]. Pt gave a verbal understanding.

## 2022-02-22 NOTE — Telephone Encounter (Signed)
Pt called stating she has a UTI and want to see if she can drop a specimen off to get medication. We do not have any appointments

## 2022-03-17 DIAGNOSIS — H34831 Tributary (branch) retinal vein occlusion, right eye, with macular edema: Secondary | ICD-10-CM | POA: Diagnosis not present

## 2022-04-04 ENCOUNTER — Other Ambulatory Visit: Payer: Self-pay | Admitting: Internal Medicine

## 2022-04-26 ENCOUNTER — Other Ambulatory Visit: Payer: Self-pay | Admitting: Internal Medicine

## 2022-05-26 ENCOUNTER — Other Ambulatory Visit: Payer: Self-pay | Admitting: Internal Medicine

## 2022-06-02 DIAGNOSIS — H34831 Tributary (branch) retinal vein occlusion, right eye, with macular edema: Secondary | ICD-10-CM | POA: Diagnosis not present

## 2022-06-15 ENCOUNTER — Ambulatory Visit (INDEPENDENT_AMBULATORY_CARE_PROVIDER_SITE_OTHER): Payer: Medicare PPO | Admitting: Internal Medicine

## 2022-06-15 ENCOUNTER — Encounter: Payer: Self-pay | Admitting: Internal Medicine

## 2022-06-15 VITALS — BP 130/82 | HR 66 | Temp 98.0°F | Ht 62.0 in | Wt 157.0 lb

## 2022-06-15 DIAGNOSIS — E7849 Other hyperlipidemia: Secondary | ICD-10-CM | POA: Diagnosis not present

## 2022-06-15 DIAGNOSIS — R7303 Prediabetes: Secondary | ICD-10-CM

## 2022-06-15 DIAGNOSIS — F411 Generalized anxiety disorder: Secondary | ICD-10-CM

## 2022-06-15 DIAGNOSIS — I700 Atherosclerosis of aorta: Secondary | ICD-10-CM | POA: Diagnosis not present

## 2022-06-15 DIAGNOSIS — E669 Obesity, unspecified: Secondary | ICD-10-CM

## 2022-06-15 DIAGNOSIS — R5383 Other fatigue: Secondary | ICD-10-CM | POA: Diagnosis not present

## 2022-06-15 LAB — CBC WITH DIFFERENTIAL/PLATELET
Basophils Absolute: 0 10*3/uL (ref 0.0–0.1)
Basophils Relative: 0.7 % (ref 0.0–3.0)
Eosinophils Absolute: 0.2 10*3/uL (ref 0.0–0.7)
Eosinophils Relative: 4.8 % (ref 0.0–5.0)
HCT: 37.3 % (ref 36.0–46.0)
Hemoglobin: 12.6 g/dL (ref 12.0–15.0)
Lymphocytes Relative: 40.1 % (ref 12.0–46.0)
Lymphs Abs: 1.7 10*3/uL (ref 0.7–4.0)
MCHC: 33.8 g/dL (ref 30.0–36.0)
MCV: 101.3 fl — ABNORMAL HIGH (ref 78.0–100.0)
Monocytes Absolute: 0.3 10*3/uL (ref 0.1–1.0)
Monocytes Relative: 7.8 % (ref 3.0–12.0)
Neutro Abs: 2 10*3/uL (ref 1.4–7.7)
Neutrophils Relative %: 46.6 % (ref 43.0–77.0)
Platelets: 171 10*3/uL (ref 150.0–400.0)
RBC: 3.68 Mil/uL — ABNORMAL LOW (ref 3.87–5.11)
RDW: 12.6 % (ref 11.5–15.5)
WBC: 4.4 10*3/uL (ref 4.0–10.5)

## 2022-06-15 LAB — COMPREHENSIVE METABOLIC PANEL
ALT: 28 U/L (ref 0–35)
AST: 31 U/L (ref 0–37)
Albumin: 3.9 g/dL (ref 3.5–5.2)
Alkaline Phosphatase: 88 U/L (ref 39–117)
BUN: 16 mg/dL (ref 6–23)
CO2: 26 mEq/L (ref 19–32)
Calcium: 9.6 mg/dL (ref 8.4–10.5)
Chloride: 106 mEq/L (ref 96–112)
Creatinine, Ser: 0.9 mg/dL (ref 0.40–1.20)
GFR: 63.23 mL/min (ref >60.00)
Glucose, Bld: 103 mg/dL — ABNORMAL HIGH (ref 70–99)
Potassium: 4.4 mEq/L (ref 3.5–5.1)
Sodium: 140 mEq/L (ref 135–145)
Total Bilirubin: 0.3 mg/dL (ref 0.2–1.2)
Total Protein: 6.9 g/dL (ref 6.0–8.3)

## 2022-06-15 LAB — LIPID PANEL
Cholesterol: 136 mg/dL (ref 0–200)
HDL: 65.7 mg/dL (ref >39.00)
LDL Cholesterol: 51 mg/dL (ref 0–99)
NonHDL: 70.27
Total CHOL/HDL Ratio: 2
Triglycerides: 95 mg/dL (ref 0.0–149.0)
VLDL: 19 mg/dL (ref 0.0–40.0)

## 2022-06-15 LAB — TSH: TSH: 2.35 u[IU]/mL (ref 0.35–5.50)

## 2022-06-15 LAB — LDL CHOLESTEROL, DIRECT: Direct LDL: 54 mg/dL

## 2022-06-15 MED ORDER — SEMAGLUTIDE-WEIGHT MANAGEMENT 1.7 MG/0.75ML ~~LOC~~ SOAJ: 1.7000 mg | SUBCUTANEOUS | 2 refills | Status: DC

## 2022-06-15 NOTE — Patient Instructions (Addendum)
I have Increased YOUR DOSE of wegovy to 1.7 mg  weekly,  You can continue 1 mg weekly for now , or increase your dose to 2 mg using the  1 mg pens you just received.   (do 1 shot in both thighs)    You have lost 17 lbs since July!

## 2022-06-15 NOTE — Progress Notes (Signed)
Subjective:  Patient ID: Erin Aguilar, female    DOB: March 27, 1951  Age: 72 y.o. MRN: [id number]  CC: The primary encounter diagnosis was Prediabetes. Diagnoses of Other hyperlipidemia, Fatigue, unspecified type, Obesity (BMI 30.0-34.9), Atherosclerosis of aorta (HCC), and Anxiety state were also pertinent to this visit.   HPI BROGHAN MCFALLS presents for  Chief Complaint  Patient presents with   Medical Management of Chronic Issues    6 month follow up    1) Obesity:  she has lost 37 lbs since  Feb 2023,   17 lbs since July using GLP 1 agonist and is currently taking 1 mg weekly with no nausea and has reached a  plateau   2) GAD:  aggravated by husband 's current hospitalization for CHF, and  her 72 yr old  autistic , Down's syndrome nephew's surgery for incidental  kidney tumor  that was recently resected in [place], and recent acquisition of a  62 month old lab/retriever puppy .  However she is sleeping well, no panic attacks.      Outpatient Medications Prior to Visit  Medication Sig Dispense Refill   aspirin 81 MG chewable tablet Chew 81 mg by mouth.     atorvastatin (LIPITOR) 80 MG tablet Take 1 tablet (80 mg total) by mouth daily. 90 tablet 3   busPIRone (BUSPAR) 10 MG tablet TAKE (1) TABLET BY MOUTH THREE TIMES DAILY 270 tablet 3   butalbital-apap-caffeine-codeine (FIORICET WITH CODEINE) 50-325-40-30 MG capsule Take 1 capsule by mouth every 6 (six) hours as needed for headache. 120 capsule 5   DULoxetine (CYMBALTA) 60 MG capsule TAKE ONE CAPSULE BY MOUTH ONCE DAILY 90 capsule 1   escitalopram (LEXAPRO) 20 MG tablet Take 1 tablet (20 mg total) by mouth daily. 90 tablet 1   esomeprazole (NEXIUM) 40 MG capsule Take 1 capsule (40 mg total) by mouth daily. 90 capsule 3   gabapentin (NEURONTIN) 100 MG capsule Take 1 capsule (100 mg total) by mouth 3 (three) times daily. Increase weekly by 100 mg if needed 90 capsule 3   Multiple Vitamin (MULTIVITAMIN) tablet Take 1 tablet by  mouth daily.     pramipexole (MIRAPEX) 0.25 MG tablet TAKE ONE TABLET BY MOUTH AT BEDTIME. 90 tablet 1   WEGOVY 1 MG/0.5ML SOAJ inject '1mg'$  s.q. WEEKLY 2 mL 2   tiZANidine (ZANAFLEX) 4 MG tablet Take 1 tablet (4 mg total) by mouth every 6 (six) hours as needed for muscle spasms. 30 tablet 5   ondansetron (ZOFRAN) 4 MG tablet Take 1 tablet (4 mg total) by mouth every 8 (eight) hours as needed for nausea or vomiting. (Patient not taking: Reported on 06/15/2022) 20 tablet 0   PREMARIN vaginal cream Place 1 Applicatorful vaginally. Twice weekly (Patient not taking: Reported on 06/15/2022)     Semaglutide, 1 MG/DOSE, 4 MG/3ML SOPN Inject 1 mg as directed once a week. (Patient not taking: Reported on 06/15/2022) 3 mL 2   Facility-Administered Medications Prior to Visit  Medication Dose Route Frequency Provider Last Rate Last Admin   0.9 %  sodium chloride infusion  500 mL Intravenous Continuous Danis, Kirke Corin, MD        Review of Systems;  Patient denies headache, fevers, malaise, unintentional weight loss, skin rash, eye pain, sinus congestion and sinus pain, sore throat, dysphagia,  hemoptysis , cough, dyspnea, wheezing, chest pain, palpitations, orthopnea, edema, abdominal pain, nausea, melena, diarrhea, constipation, flank pain, dysuria, hematuria, urinary  Frequency, nocturia, numbness, tingling, seizures,  Focal weakness, Loss of consciousness,  Tremor, insomnia, depression,  and suicidal ideation.      Objective:  BP 130/82   Pulse 66   Temp 98 F (36.7 C) (Oral)   Ht '5\' 2"'$  (1.575 m)   Wt 157 lb (71.2 kg)   SpO2 96%   BMI 28.72 kg/m   BP Readings from Last 3 Encounters:  06/15/22 130/82  12/27/21 100/60  12/14/21 106/60    Wt Readings from Last 3 Encounters:  06/15/22 157 lb (71.2 kg)  01/18/22 174 lb (78.9 kg)  12/27/21 174 lb 2 oz (79 kg)    Physical Exam Vitals reviewed.  Constitutional:      General: She is not in acute distress.    Appearance: Normal appearance.  She is normal weight. She is not ill-appearing, toxic-appearing or diaphoretic.  HENT:     Head: Normocephalic.  Eyes:     General: No scleral icterus.       Right eye: No discharge.        Left eye: No discharge.     Conjunctiva/sclera: Conjunctivae normal.  Cardiovascular:     Rate and Rhythm: Normal rate and regular rhythm.     Heart sounds: Normal heart sounds.  Pulmonary:     Effort: Pulmonary effort is normal. No respiratory distress.     Breath sounds: Normal breath sounds.  Musculoskeletal:        General: Normal range of motion.  Skin:    General: Skin is warm and dry.  Neurological:     General: No focal deficit present.     Mental Status: She is alert and oriented to person, place, and time. Mental status is at baseline.  Psychiatric:        Mood and Affect: Mood normal.        Behavior: Behavior normal.        Thought Content: Thought content normal.        Judgment: Judgment normal.    Lab Results  Component Value Date   HGBA1C 6.1 06/15/2022   HGBA1C 6.2 11/07/2021   HGBA1C 6.2 06/13/2021    Lab Results  Component Value Date   CREATININE 0.90 06/15/2022   CREATININE 0.83 11/07/2021   CREATININE 0.90 06/13/2021    Lab Results  Component Value Date   WBC 4.4 06/15/2022   HGB 12.6 06/15/2022   HCT 37.3 06/15/2022   PLT 171.0 06/15/2022   GLUCOSE 103 (H) 06/15/2022   CHOL 136 06/15/2022   TRIG 95.0 06/15/2022   HDL 65.70 06/15/2022   LDLDIRECT 54.0 06/15/2022   LDLCALC 51 06/15/2022   ALT 28 06/15/2022   AST 31 06/15/2022   NA 140 06/15/2022   K 4.4 06/15/2022   CL 106 06/15/2022   CREATININE 0.90 06/15/2022   BUN 16 06/15/2022   CO2 26 06/15/2022   TSH 2.35 06/15/2022   HGBA1C 6.1 06/15/2022   MICROALBUR 0.8 12/09/2020    MM 3D SCREEN BREAST BILATERAL  Result Date: 02/09/2022 CLINICAL DATA:  Screening. EXAM: DIGITAL SCREENING BILATERAL MAMMOGRAM WITH TOMOSYNTHESIS AND CAD TECHNIQUE: Bilateral screening digital craniocaudal and  mediolateral oblique mammograms were obtained. Bilateral screening digital breast tomosynthesis was performed. The images were evaluated with computer-aided detection. COMPARISON:  Previous exam(s). ACR Breast Density Category c: The breast tissue is heterogeneously dense, which may obscure small masses. FINDINGS: There are no findings suspicious for malignancy. IMPRESSION: No mammographic evidence of malignancy. A result letter of this screening mammogram will be mailed directly to the patient. RECOMMENDATION:  Screening mammogram in one year. (Code:SM-B-01Y) BI-RADS CATEGORY  1: Negative. Electronically Signed   By: Lovey Newcomer M.D.   On: 02/09/2022 08:19    Assessment & Plan:  .Prediabetes Assessment & Plan: With obesity worsening due to inactivity.  Patient is tolerating Ozempic/Wegovy and losing weight.  She has lowered her A1c   Lab Results  Component Value Date   HGBA1C 6.1 06/15/2022     Orders: -     Comprehensive metabolic panel -     Hemoglobin A1c  Other hyperlipidemia -     Lipid panel -     LDL cholesterol, direct  Fatigue, unspecified type -     TSH -     CBC with Differential/Platelet  Obesity (BMI 30.0-34.9) Assessment & Plan: I have congratulated her in reduction of   BMI and encouraged  Continued weight loss with goal of 10% of body weight over the next 6 months using a low glycemic index diet , GLP 1 agonist,  regular exercise a minimum of 5 days per week.     Atherosclerosis of aorta [facility]) Assessment & Plan: Reviewed findings of prior CT scan today..  Patient is tolerating high potency statin therapy    Lab Results  Component Value Date   CHOL 136 06/15/2022   HDL 65.70 06/15/2022   LDLCALC 51 06/15/2022   LDLDIRECT 54.0 06/15/2022   TRIG 95.0 06/15/2022   CHOLHDL 2 06/15/2022      Anxiety state Assessment & Plan: Improving with lexapro/cymbalta .  No changes today   Other orders -     Semaglutide-Weight Management; Inject 1.7 mg into the skin  once a week for 28 days.  Dispense: 3 mL; Refill: 2     I provided 30 minutes of face-to-face time during this encounter reviewing patient's last visit with me, patient's  recent surgical and non surgical procedures, previous  labs and imaging studies, counseling on currently addressed issues of obesity and anxiety,  and post visit ordering to diagnostics and therapeutics .   Follow-up: Return in about 3 months (around 09/13/2022) for weight managemet .   Crecencio Mc, MD

## 2022-06-16 ENCOUNTER — Other Ambulatory Visit: Payer: Self-pay | Admitting: Internal Medicine

## 2022-06-16 LAB — HEMOGLOBIN A1C: Hgb A1c MFr Bld: 6.1 % (ref 4.6–6.5)

## 2022-06-16 NOTE — Assessment & Plan Note (Signed)
Reviewed findings of prior CT scan today..  Patient is tolerating high potency statin therapy    Lab Results  Component Value Date   CHOL 136 06/15/2022   HDL 65.70 06/15/2022   LDLCALC 51 06/15/2022   LDLDIRECT 54.0 06/15/2022   TRIG 95.0 06/15/2022   CHOLHDL 2 06/15/2022

## 2022-06-16 NOTE — Assessment & Plan Note (Signed)
I have congratulated her in reduction of   BMI and encouraged  Continued weight loss with goal of 10% of body weight over the next 6 months using a low glycemic index diet , GLP 1 agonist,  regular exercise a minimum of 5 days per week.

## 2022-06-16 NOTE — Assessment & Plan Note (Signed)
With obesity worsening due to inactivity.  Patient is tolerating Ozempic/Wegovy and losing weight.  She has lowered her A1c   Lab Results  Component Value Date   HGBA1C 6.1 06/15/2022

## 2022-06-16 NOTE — Assessment & Plan Note (Signed)
Improving with lexapro/cymbalta .  No changes today

## 2022-06-23 DIAGNOSIS — R110 Nausea: Secondary | ICD-10-CM | POA: Diagnosis not present

## 2022-06-23 DIAGNOSIS — E538 Deficiency of other specified B group vitamins: Secondary | ICD-10-CM | POA: Diagnosis not present

## 2022-06-23 DIAGNOSIS — Z9884 Bariatric surgery status: Secondary | ICD-10-CM | POA: Diagnosis not present

## 2022-06-23 DIAGNOSIS — R1032 Left lower quadrant pain: Secondary | ICD-10-CM | POA: Diagnosis not present

## 2022-06-26 ENCOUNTER — Other Ambulatory Visit: Payer: Self-pay | Admitting: Internal Medicine

## 2022-07-20 ENCOUNTER — Other Ambulatory Visit: Payer: Self-pay | Admitting: Internal Medicine

## 2022-08-03 ENCOUNTER — Other Ambulatory Visit: Payer: Self-pay | Admitting: Internal Medicine

## 2022-08-04 DIAGNOSIS — H34831 Tributary (branch) retinal vein occlusion, right eye, with macular edema: Secondary | ICD-10-CM | POA: Diagnosis not present

## 2022-08-15 ENCOUNTER — Other Ambulatory Visit: Payer: Self-pay | Admitting: Internal Medicine

## 2022-08-15 DIAGNOSIS — M542 Cervicalgia: Secondary | ICD-10-CM

## 2022-09-13 ENCOUNTER — Other Ambulatory Visit (HOSPITAL_COMMUNITY): Payer: Self-pay

## 2022-09-13 ENCOUNTER — Ambulatory Visit (INDEPENDENT_AMBULATORY_CARE_PROVIDER_SITE_OTHER): Payer: Medicare PPO | Admitting: Internal Medicine

## 2022-09-13 ENCOUNTER — Encounter: Payer: Self-pay | Admitting: Internal Medicine

## 2022-09-13 ENCOUNTER — Telehealth: Payer: Self-pay

## 2022-09-13 VITALS — BP 116/60 | HR 62 | Temp 98.3°F | Ht 62.0 in | Wt 154.2 lb

## 2022-09-13 DIAGNOSIS — E7849 Other hyperlipidemia: Secondary | ICD-10-CM | POA: Diagnosis not present

## 2022-09-13 DIAGNOSIS — R7303 Prediabetes: Secondary | ICD-10-CM

## 2022-09-13 DIAGNOSIS — R519 Headache, unspecified: Secondary | ICD-10-CM

## 2022-09-13 DIAGNOSIS — G8929 Other chronic pain: Secondary | ICD-10-CM

## 2022-09-13 DIAGNOSIS — M542 Cervicalgia: Secondary | ICD-10-CM | POA: Diagnosis not present

## 2022-09-13 MED ORDER — BUTALBITAL-APAP-CAFF-COD 50-325-40-30 MG PO CAPS
1.0000 | ORAL_CAPSULE | Freq: Two times a day (BID) | ORAL | 5 refills | Status: DC
Start: 2022-09-13 — End: 2023-03-19

## 2022-09-13 MED ORDER — BACLOFEN 10 MG PO TABS
10.0000 mg | ORAL_TABLET | Freq: Three times a day (TID) | ORAL | 2 refills | Status: DC
Start: 2022-09-13 — End: 2022-11-28

## 2022-09-13 MED ORDER — VICTOZA 18 MG/3ML ~~LOC~~ SOPN: PEN_INJECTOR | SUBCUTANEOUS | 0 refills | Status: DC

## 2022-09-13 NOTE — Progress Notes (Signed)
Subjective:  Patient ID: Erin Aguilar, female    DOB: 1951-01-16  Age: 72 y.o. MRN: [id number]  CC: The primary encounter diagnosis was Prediabetes. Diagnoses of Other hyperlipidemia, Neck pain, and Chronic nonintractable headache, unspecified headache type were also pertinent to this visit.   HPI Erin Aguilar presents for  Chief Complaint  Patient presents with   Medical Management of Chronic Issues    3 month follow up    1) Chronic Headache disorder:  managed with Fioricet (used 3/week ) for years  headaches have become daily occurrences.  AGGRAVATED BY arthritis in neck.   However she also has migraine headaches with aura which occur once or twice per week, which are triggered by stress,  which she also treats with fioricet /codeine.  The aura is described as vision changes that start as a fluttering light on the right side that causes blurred vision ,  than spreads to the left.  Her migraine Headaches always occurs bilaterally at the temples   Currently taking one fioricet daily every morning,  then another later I"if I develop a headache"      Being treated by Dr Druscilla Brownie every 8 to 12 weeks DIAGNOSIS UNCLEAR.(Patient thinks he called it retinal occlusion)   Obesity:  s/p gastric bypass,  with weight regain.  Now with 40 lb weight loss using Wegovy    Outpatient Medications Prior to Visit  Medication Sig Dispense Refill   aspirin 81 MG chewable tablet Chew 81 mg by mouth.     atorvastatin (LIPITOR) 80 MG tablet Take 1 tablet (80 mg total) by mouth daily. 90 tablet 3   busPIRone (BUSPAR) 10 MG tablet TAKE (1) TABLET BY MOUTH THREE TIMES DAILY 270 tablet 3   DULoxetine (CYMBALTA) 60 MG capsule TAKE ONE CAPSULE BY MOUTH ONCE DAILY 90 capsule 1   escitalopram (LEXAPRO) 20 MG tablet TAKE ONE TABLET BY MOUTH ONCE DAILY 90 tablet 1   esomeprazole (NEXIUM) 40 MG capsule Take 1 capsule (40 mg total) by mouth daily. 90 capsule 3   gabapentin (NEURONTIN) 100 MG capsule Take 1 capsule  (100 mg total) by mouth 3 (three) times daily. Increase weekly by 100 mg if needed 90 capsule 3   Multiple Vitamin (MULTIVITAMIN) tablet Take 1 tablet by mouth daily.     pramipexole (MIRAPEX) 0.25 MG tablet TAKE ONE TABLET BY MOUTH AT BEDTIME 90 tablet 1   butalbital-apap-caffeine-codeine (FIORICET WITH CODEINE) 50-325-40-30 MG capsule TAKE ONE CAPSULE BY MOUTH EVERY 6 HOURS AS NEEDED FOR HEADACHE 120 capsule 5   tiZANidine (ZANAFLEX) 4 MG tablet TAKE ONE TABLET BY MOUTH EVERY 6 HOURS AS NEEDED FOR MUSCLE SPASMS 30 tablet 5   Semaglutide-Weight Management 1.7 MG/0.75ML SOAJ Inject 1.7 mg into the skin once a week for 28 days. (Patient not taking: Reported on 09/13/2022) 3 mL 2   WEGOVY 1 MG/0.5ML SOAJ inject 1mg  s.q. WEEKLY (Patient not taking: Reported on 09/13/2022) 2 mL 2   Facility-Administered Medications Prior to Visit  Medication Dose Route Frequency Provider Last Rate Last Admin   0.9 %  sodium chloride infusion  500 mL Intravenous Continuous Danis, Andreas Blower, MD        Review of Systems;  Patient denies headache, fevers, malaise, unintentional weight loss, skin rash, eye pain, sinus congestion and sinus pain, sore throat, dysphagia,  hemoptysis , cough, dyspnea, wheezing, chest pain, palpitations, orthopnea, edema, abdominal pain, nausea, melena, diarrhea, constipation, flank pain, dysuria, hematuria, urinary  Frequency, nocturia, numbness, tingling, seizures,  Focal weakness, Loss of consciousness,  Tremor, insomnia, depression, anxiety, and suicidal ideation.      Objective:  BP 116/60   Pulse 62   Temp 98.3 F (36.8 C) (Oral)   Ht 5\' 2"  (1.575 m)   Wt 154 lb 3.2 oz (69.9 kg)   SpO2 96%   BMI 28.20 kg/m   BP Readings from Last 3 Encounters:  09/13/22 116/60  06/15/22 130/82  12/27/21 100/60    Wt Readings from Last 3 Encounters:  09/13/22 154 lb 3.2 oz (69.9 kg)  06/15/22 157 lb (71.2 kg)  01/18/22 174 lb (78.9 kg)    Physical Exam Vitals reviewed.   Constitutional:      General: She is not in acute distress.    Appearance: Normal appearance. She is normal weight. She is not ill-appearing, toxic-appearing or diaphoretic.  HENT:     Head: Normocephalic.  Eyes:     General: No scleral icterus.       Right eye: No discharge.        Left eye: No discharge.     Conjunctiva/sclera: Conjunctivae normal.  Cardiovascular:     Rate and Rhythm: Normal rate and regular rhythm.     Heart sounds: Normal heart sounds.  Pulmonary:     Effort: Pulmonary effort is normal. No respiratory distress.     Breath sounds: Normal breath sounds.  Musculoskeletal:        General: Normal range of motion.  Skin:    General: Skin is warm and dry.  Neurological:     General: No focal deficit present.     Mental Status: She is alert and oriented to person, place, and time. Mental status is at baseline.  Psychiatric:        Mood and Affect: Mood normal.        Behavior: Behavior normal.        Thought Content: Thought content normal.        Judgment: Judgment normal.   Lab Results  Component Value Date   HGBA1C 6.1 06/15/2022   HGBA1C 6.2 11/07/2021   HGBA1C 6.2 06/13/2021    Lab Results  Component Value Date   CREATININE 0.90 06/15/2022   CREATININE 0.83 11/07/2021   CREATININE 0.90 06/13/2021    Lab Results  Component Value Date   WBC 4.4 06/15/2022   HGB 12.6 06/15/2022   HCT 37.3 06/15/2022   PLT 171.0 06/15/2022   GLUCOSE 103 (H) 06/15/2022   CHOL 136 06/15/2022   TRIG 95.0 06/15/2022   HDL 65.70 06/15/2022   LDLDIRECT 54.0 06/15/2022   LDLCALC 51 06/15/2022   ALT 28 06/15/2022   AST 31 06/15/2022   NA 140 06/15/2022   K 4.4 06/15/2022   CL 106 06/15/2022   CREATININE 0.90 06/15/2022   BUN 16 06/15/2022   CO2 26 06/15/2022   TSH 2.35 06/15/2022   HGBA1C 6.1 06/15/2022   MICROALBUR 0.8 12/09/2020    MM 3D SCREEN BREAST BILATERAL  Result Date: 02/09/2022 CLINICAL DATA:  Screening. EXAM: DIGITAL SCREENING BILATERAL  MAMMOGRAM WITH TOMOSYNTHESIS AND CAD TECHNIQUE: Bilateral screening digital craniocaudal and mediolateral oblique mammograms were obtained. Bilateral screening digital breast tomosynthesis was performed. The images were evaluated with computer-aided detection. COMPARISON:  Previous exam(s). ACR Breast Density Category c: The breast tissue is heterogeneously dense, which may obscure small masses. FINDINGS: There are no findings suspicious for malignancy. IMPRESSION: No mammographic evidence of malignancy. A result letter of this screening mammogram will be mailed directly to the patient.  RECOMMENDATION: Screening mammogram in one year. (Code:SM-B-01Y) BI-RADS CATEGORY  1: Negative. Electronically Signed   By: Annia Belt M.D.   On: 02/09/2022 08:19    Assessment & Plan:  .Prediabetes Assessment & Plan: Complicated by obesity.  She has lost 40 lbs with wegovy but is unable to continue any GLP agonist due to insurance nonpayment.   Lab Results  Component Value Date   HGBA1C 6.1 06/15/2022     Orders: -     Comprehensive metabolic panel; Future -     Hemoglobin A1c; Future -     Microalbumin / creatinine urine ratio; Future  Other hyperlipidemia -     LDL cholesterol, direct; Future -     Lipid panel; Future  Neck pain -     Butalbital-APAP-Caff-Cod; Take 1 capsule by mouth 2 (two) times daily.  Dispense: 60 capsule; Refill: 5  Chronic nonintractable headache, unspecified headache type Assessment & Plan: Advised to reduce fioricet use and maximize tylenol use .adding muscle relaxer    Other orders -     Baclofen; Take 1 tablet (10 mg total) by mouth 3 (three) times daily.  Dispense: 90 each; Refill: 2 -     Victoza; Start 0.6mg  SQ once a day for 7 days, then increase to 1.2mg  once a day  Dispense: 6 mL; Refill: 0   Follow-up: Return in about 3 months (around 12/14/2022).   Sherlene Shams, MD

## 2022-09-13 NOTE — Telephone Encounter (Signed)
PA for Victoza is needed 

## 2022-09-13 NOTE — Telephone Encounter (Signed)
As pt is prediabetic and not diabetic, insurance will not cover Victoza, Ozempic, or Mounjaro. No PA submitted at this time.

## 2022-09-13 NOTE — Patient Instructions (Addendum)
Limit your use of fioricet to No more than 2 fioricet  daily .  Do not use for sinus headaches.  Use flonase  on nasonex   New refill sent to  [facility] for #60 /month   I need records from [name] regarding your diagnosis before I can try treating your migraine headaches difrerently    Max out the tylenol.  You can add up to 2000 mg of acetominophen (tylenol) every day safely  In divided doses (500 mg every 6 hours  Or 1000 mg every 12 hours.)    the fioricet has 325 mg of tylenol  per tablet.     Change in muscle relaxer to baclofen 10 mg every 8 hours as needed    I am prescribing Victoza as a substitute for Wegovy,  it is same kind of medication ,  but a DAILY INJECTION that is increased each week IF NEEDED .  IT DOES REQUIRE PRIOR AUTHORIZATION

## 2022-09-14 ENCOUNTER — Other Ambulatory Visit (HOSPITAL_COMMUNITY): Payer: Self-pay

## 2022-09-14 NOTE — Telephone Encounter (Signed)
Unfortunately, they are "covered" as I got a successful claim, but insurance expects pt to pay the full price so not truly covered.

## 2022-09-15 NOTE — Telephone Encounter (Signed)
Pt is aware and gave a verbal understanding.  

## 2022-09-16 NOTE — Assessment & Plan Note (Signed)
Complicated by obesity.  She has lost 40 lbs with wegovy but is unable to continue any GLP agonist due to insurance nonpayment.   Lab Results  Component Value Date   HGBA1C 6.1 06/15/2022

## 2022-09-16 NOTE — Assessment & Plan Note (Signed)
Advised to reduce fioricet use and maximize tylenol use .adding muscle relaxer

## 2022-09-26 IMAGING — CT CT ORBITS W/O CM
3 series · 14 of 47 positions shown, 16 images · non-contrast
Comparison: No pertinent prior exam.

CLINICAL DATA: Pain behind right eye.

EXAM:
CT ORBITS WITHOUT CONTRAST
TECHNIQUE: Multidetector CT imaging of the orbits was performed using the
standard protocol without intravenous contrast. Multiplanar CT image
reconstructions were also generated.
RADIATION DOSE REDUCTION: This exam was performed according to the
departmental dose-optimization program which includes automated
exposure control, adjustment of the mA and/or kV according to
patient size and/or use of iterative reconstruction technique.

[Series 2: max soft · axial · 0.35mm/px · z∈[-51,+31]mm · 8 of 49 slices shown, 10 images]
[im 4/49  brain]
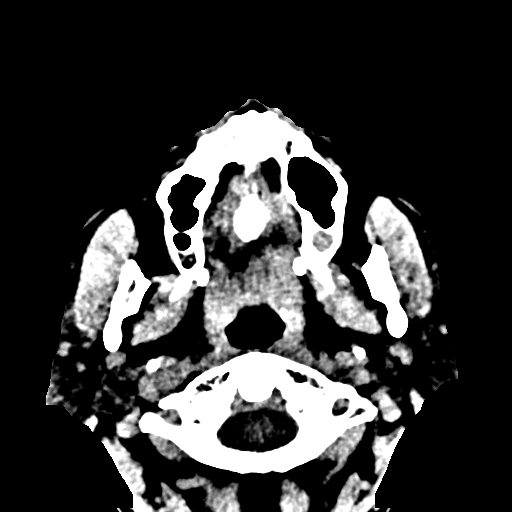
[im 4/49  bone]
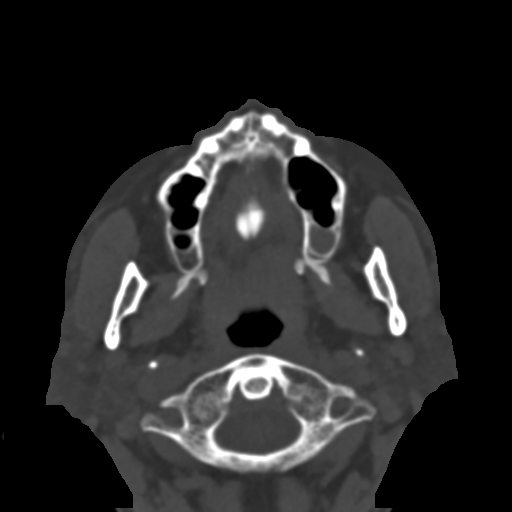
[im 10/49  bone]
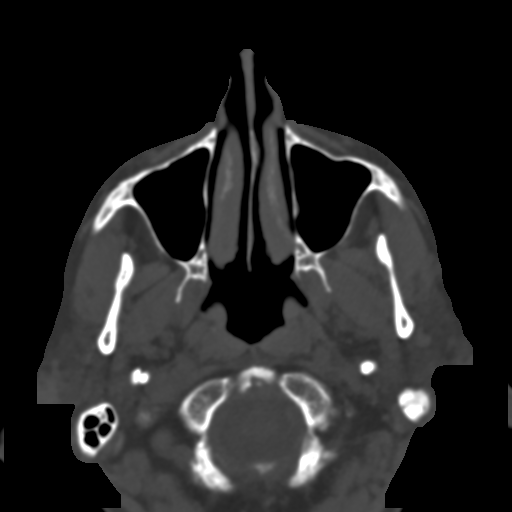
[im 15/49  bone]
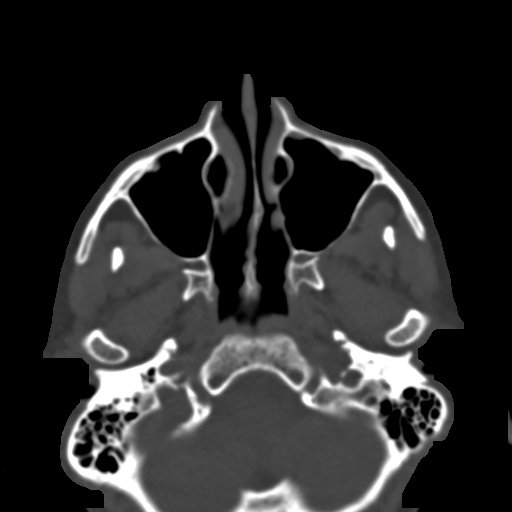
[im 22/49  bone]
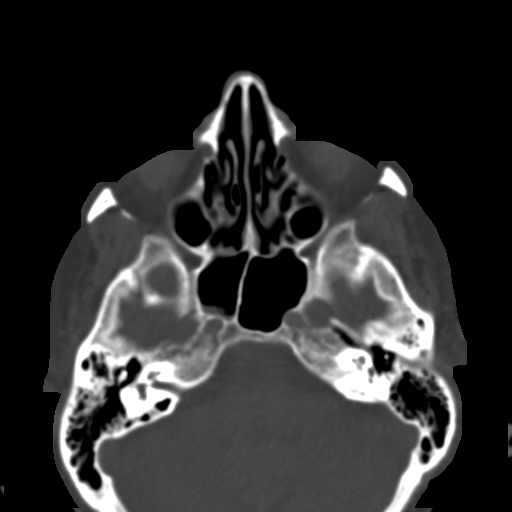
[im 27/49  brain]
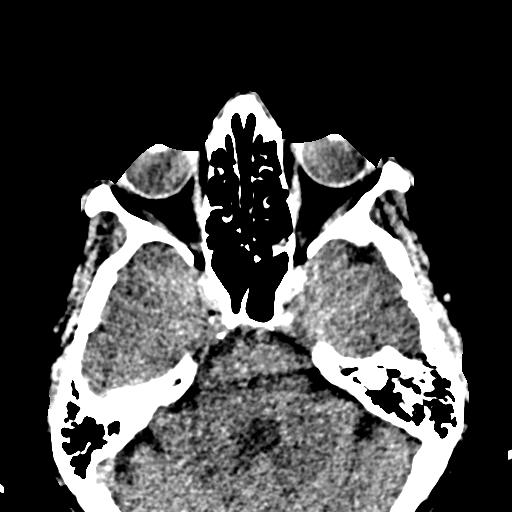
[im 27/49  bone]
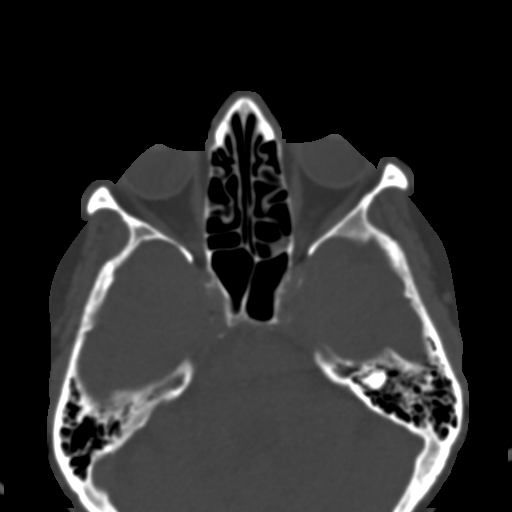
[im 34/49  bone]
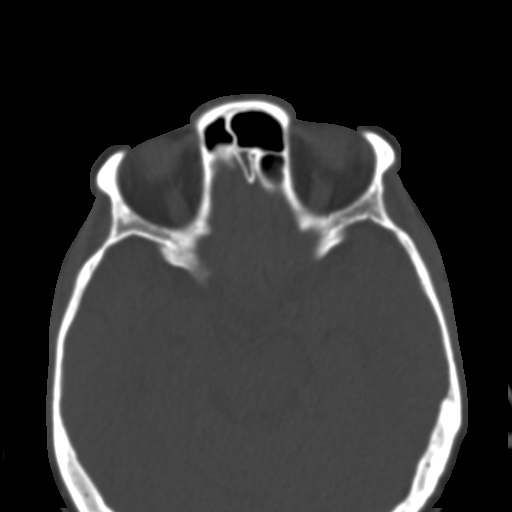
[im 39/49  bone]
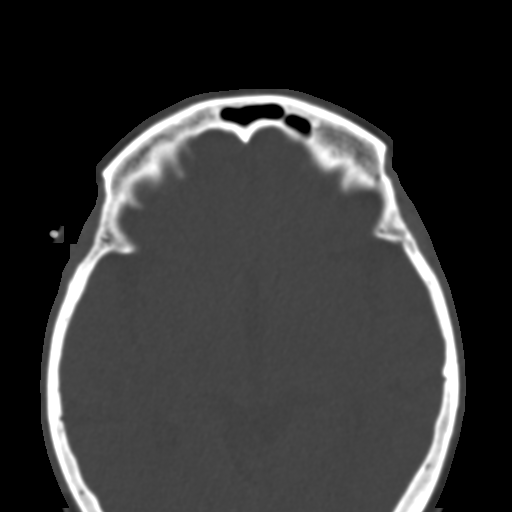
[im 45/49  bone]
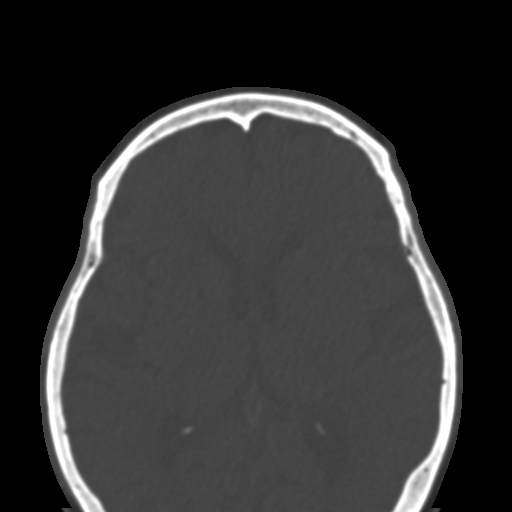

[Series 6: coronal soft · coronal · 0.23mm/px · 3 of 104 slices shown]
[im 35/104  bone]
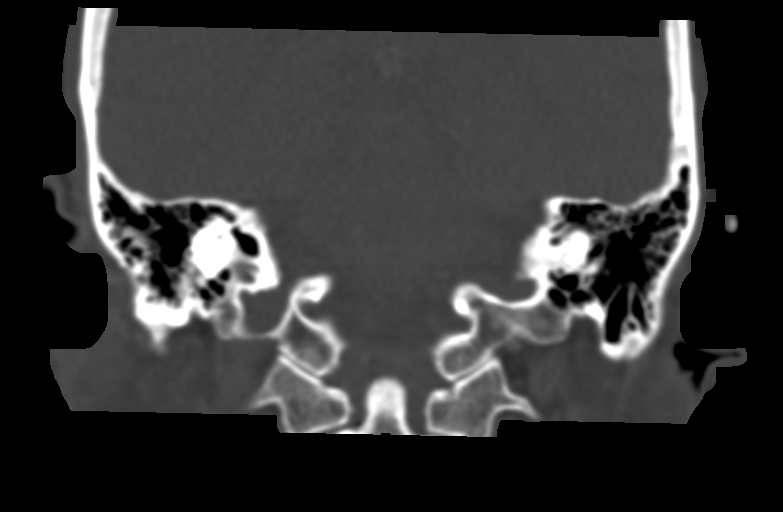
[im 46/104  bone]
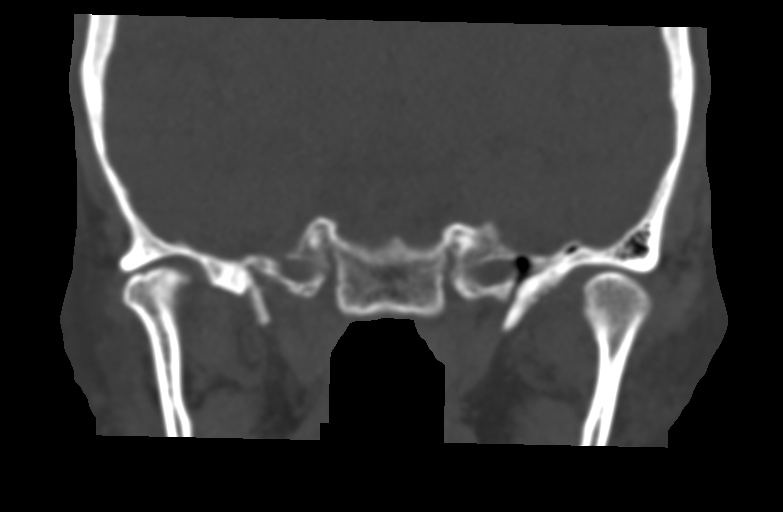
[im 58/104  bone]
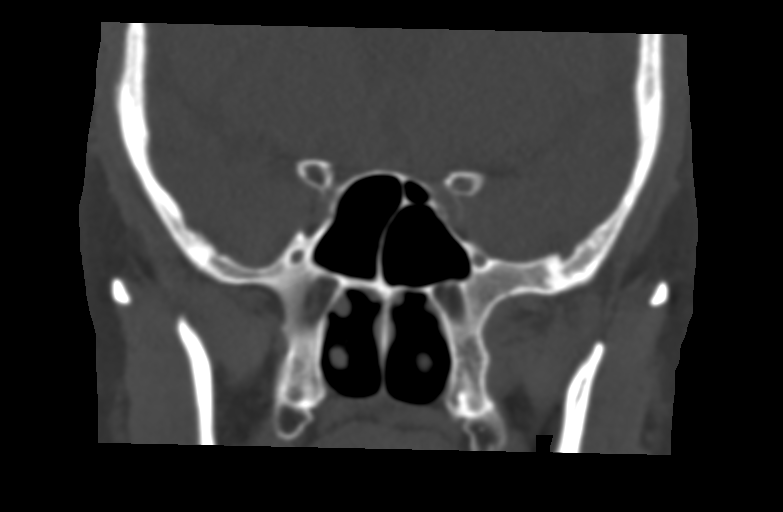

[Series 7: sagittal soft · sagittal · 0.24mm/px · 3 of 90 slices shown]
[im 30/90  bone]
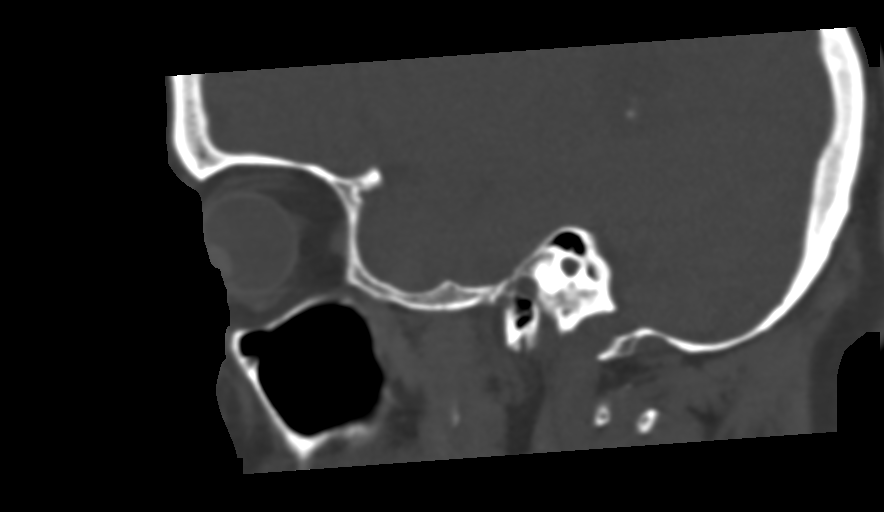
[im 45/90  bone]
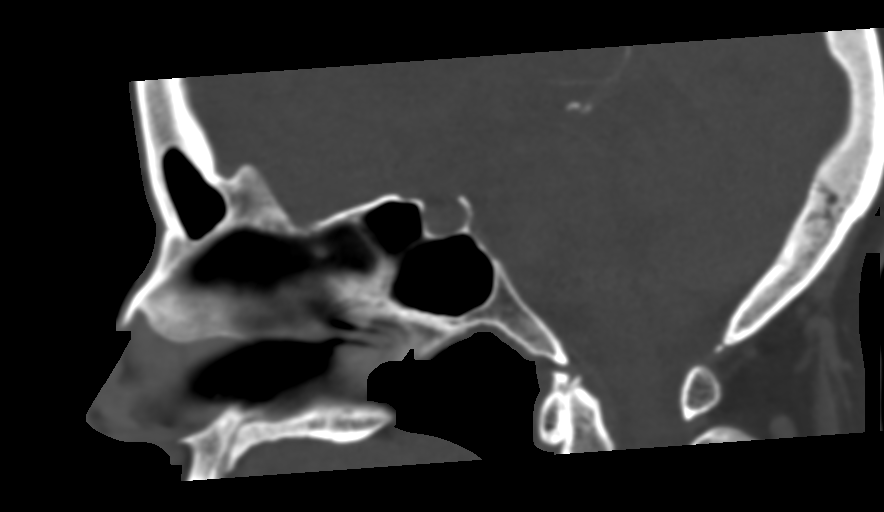
[im 60/90  bone]
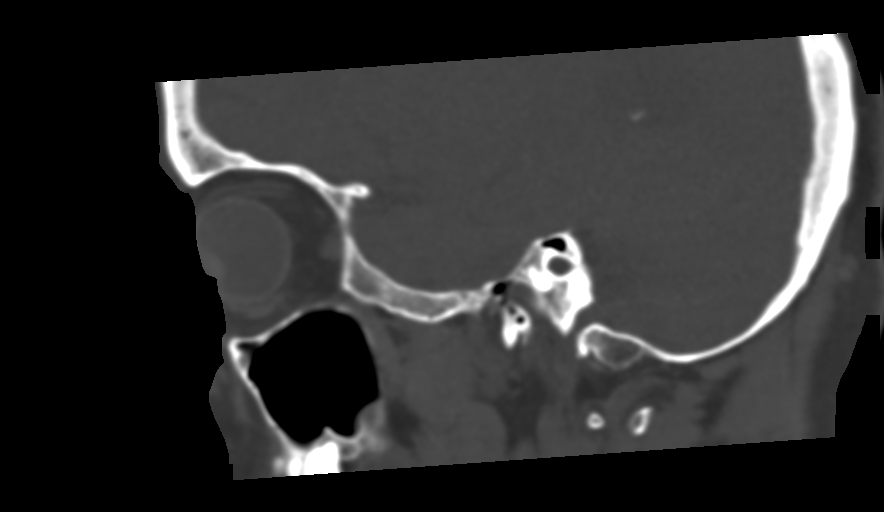

[14 of 47 positions shown; findings below may reference images not displayed]

FINDINGS: Orbits: No orbital mass or evidence of inflammation. Normal
appearance of the globes, optic nerve-sheath complexes, extraocular
muscles, orbital fat and lacrimal glands.

Visible paranasal sinuses: Clear

Soft tissues: Negative

Osseous: No fracture or aggressive lesion.

Limited intracranial: No acute or significant finding.
IMPRESSION: Negative.

## 2022-10-06 DIAGNOSIS — H43813 Vitreous degeneration, bilateral: Secondary | ICD-10-CM | POA: Diagnosis not present

## 2022-10-06 DIAGNOSIS — Z0100 Encounter for examination of eyes and vision without abnormal findings: Secondary | ICD-10-CM | POA: Diagnosis not present

## 2022-10-06 DIAGNOSIS — H34831 Tributary (branch) retinal vein occlusion, right eye, with macular edema: Secondary | ICD-10-CM | POA: Diagnosis not present

## 2022-10-06 DIAGNOSIS — H2513 Age-related nuclear cataract, bilateral: Secondary | ICD-10-CM | POA: Diagnosis not present

## 2022-10-18 DIAGNOSIS — R112 Nausea with vomiting, unspecified: Secondary | ICD-10-CM | POA: Diagnosis not present

## 2022-10-18 DIAGNOSIS — R509 Fever, unspecified: Secondary | ICD-10-CM | POA: Diagnosis not present

## 2022-10-18 DIAGNOSIS — Z03818 Encounter for observation for suspected exposure to other biological agents ruled out: Secondary | ICD-10-CM | POA: Diagnosis not present

## 2022-10-19 DIAGNOSIS — J9811 Atelectasis: Secondary | ICD-10-CM | POA: Diagnosis not present

## 2022-10-19 DIAGNOSIS — G2581 Restless legs syndrome: Secondary | ICD-10-CM | POA: Diagnosis not present

## 2022-10-19 DIAGNOSIS — D696 Thrombocytopenia, unspecified: Secondary | ICD-10-CM | POA: Diagnosis not present

## 2022-10-19 DIAGNOSIS — E871 Hypo-osmolality and hyponatremia: Secondary | ICD-10-CM | POA: Diagnosis not present

## 2022-10-19 DIAGNOSIS — I444 Left anterior fascicular block: Secondary | ICD-10-CM | POA: Diagnosis not present

## 2022-10-19 DIAGNOSIS — A7740 Ehrlichiosis, unspecified: Secondary | ICD-10-CM | POA: Diagnosis not present

## 2022-10-19 DIAGNOSIS — D7589 Other specified diseases of blood and blood-forming organs: Secondary | ICD-10-CM | POA: Diagnosis not present

## 2022-10-19 DIAGNOSIS — R9431 Abnormal electrocardiogram [ECG] [EKG]: Secondary | ICD-10-CM | POA: Diagnosis not present

## 2022-10-19 DIAGNOSIS — R509 Fever, unspecified: Secondary | ICD-10-CM | POA: Diagnosis not present

## 2022-10-19 DIAGNOSIS — D708 Other neutropenia: Secondary | ICD-10-CM | POA: Diagnosis not present

## 2022-10-20 DIAGNOSIS — J9811 Atelectasis: Secondary | ICD-10-CM | POA: Diagnosis not present

## 2022-10-20 DIAGNOSIS — A7740 Ehrlichiosis, unspecified: Secondary | ICD-10-CM | POA: Diagnosis not present

## 2022-10-20 DIAGNOSIS — R9389 Abnormal findings on diagnostic imaging of other specified body structures: Secondary | ICD-10-CM | POA: Diagnosis not present

## 2022-10-20 DIAGNOSIS — R7401 Elevation of levels of liver transaminase levels: Secondary | ICD-10-CM | POA: Diagnosis not present

## 2022-10-20 DIAGNOSIS — D696 Thrombocytopenia, unspecified: Secondary | ICD-10-CM | POA: Diagnosis not present

## 2022-10-20 DIAGNOSIS — R509 Fever, unspecified: Secondary | ICD-10-CM | POA: Diagnosis not present

## 2022-10-20 DIAGNOSIS — R21 Rash and other nonspecific skin eruption: Secondary | ICD-10-CM | POA: Diagnosis not present

## 2022-10-21 DIAGNOSIS — A7740 Ehrlichiosis, unspecified: Secondary | ICD-10-CM | POA: Diagnosis not present

## 2022-10-21 DIAGNOSIS — R7401 Elevation of levels of liver transaminase levels: Secondary | ICD-10-CM | POA: Diagnosis not present

## 2022-10-21 DIAGNOSIS — R21 Rash and other nonspecific skin eruption: Secondary | ICD-10-CM | POA: Diagnosis not present

## 2022-10-21 DIAGNOSIS — D696 Thrombocytopenia, unspecified: Secondary | ICD-10-CM | POA: Diagnosis not present

## 2022-10-23 ENCOUNTER — Telehealth: Payer: Self-pay | Admitting: Internal Medicine

## 2022-10-23 NOTE — Telephone Encounter (Signed)
Patient was relesased from the hospital. She had tick fever and is suppose to see her provider within 1 week. At time of call no available appointments with provider.

## 2022-10-23 NOTE — Telephone Encounter (Signed)
Pt husband called stating pt is getting out of the hospital and need to make an hospital follow-up within a week but there is no available appointments within that time frame

## 2022-10-24 NOTE — Telephone Encounter (Signed)
Spoke with pt and scheduled her with Claris Che since no availability with Dr. Darrick Huntsman.

## 2022-10-24 NOTE — Telephone Encounter (Signed)
See previous message

## 2022-10-27 ENCOUNTER — Ambulatory Visit (INDEPENDENT_AMBULATORY_CARE_PROVIDER_SITE_OTHER): Payer: Medicare PPO | Admitting: Family

## 2022-10-27 ENCOUNTER — Encounter: Payer: Self-pay | Admitting: Family

## 2022-10-27 VITALS — BP 118/70 | HR 65 | Temp 97.5°F | Ht 62.5 in | Wt 151.4 lb

## 2022-10-27 DIAGNOSIS — M542 Cervicalgia: Secondary | ICD-10-CM | POA: Diagnosis not present

## 2022-10-27 DIAGNOSIS — R7689 Other specified abnormal immunological findings in serum: Secondary | ICD-10-CM | POA: Insufficient documentation

## 2022-10-27 DIAGNOSIS — R768 Other specified abnormal immunological findings in serum: Secondary | ICD-10-CM

## 2022-10-27 LAB — COMPREHENSIVE METABOLIC PANEL
ALT: 50 U/L — ABNORMAL HIGH (ref 0–35)
AST: 40 U/L — ABNORMAL HIGH (ref 0–37)
Albumin: 3.5 g/dL (ref 3.5–5.2)
Alkaline Phosphatase: 126 U/L — ABNORMAL HIGH (ref 39–117)
BUN: 13 mg/dL (ref 6–23)
CO2: 29 mEq/L (ref 19–32)
Calcium: 8.7 mg/dL (ref 8.4–10.5)
Chloride: 103 mEq/L (ref 96–112)
Creatinine, Ser: 0.71 mg/dL (ref 0.40–1.20)
GFR: 83.83 mL/min (ref >60.00)
Glucose, Bld: 113 mg/dL — ABNORMAL HIGH (ref 70–99)
Potassium: 3.4 mEq/L — ABNORMAL LOW (ref 3.5–5.1)
Sodium: 136 mEq/L (ref 135–145)
Total Bilirubin: 0.4 mg/dL (ref 0.2–1.2)
Total Protein: 6.8 g/dL (ref 6.0–8.3)

## 2022-10-27 LAB — CBC WITH DIFFERENTIAL/PLATELET
Basophils Absolute: 0.1 10*3/uL (ref 0.0–0.1)
Basophils Relative: 0.9 % (ref 0.0–3.0)
Eosinophils Absolute: 0.1 10*3/uL (ref 0.0–0.7)
Eosinophils Relative: 2.2 % (ref 0.0–5.0)
HCT: 33.2 % — ABNORMAL LOW (ref 36.0–46.0)
Hemoglobin: 11.3 g/dL — ABNORMAL LOW (ref 12.0–15.0)
Lymphocytes Relative: 50.4 % — ABNORMAL HIGH (ref 12.0–46.0)
Lymphs Abs: 2.9 10*3/uL (ref 0.7–4.0)
MCHC: 34.1 g/dL (ref 30.0–36.0)
MCV: 101.1 fl — ABNORMAL HIGH (ref 78.0–100.0)
Monocytes Absolute: 0.7 10*3/uL (ref 0.1–1.0)
Monocytes Relative: 11.4 % (ref 3.0–12.0)
Neutro Abs: 2 10*3/uL (ref 1.4–7.7)
Neutrophils Relative %: 35.1 % — ABNORMAL LOW (ref 43.0–77.0)
Platelets: 321 10*3/uL (ref 150.0–400.0)
RBC: 3.28 Mil/uL — ABNORMAL LOW (ref 3.87–5.11)
RDW: 12.8 % (ref 11.5–15.5)
WBC: 5.8 10*3/uL (ref 4.0–10.5)

## 2022-10-27 MED ORDER — CYCLOBENZAPRINE HCL 5 MG PO TABS
5.0000 mg | ORAL_TABLET | Freq: Three times a day (TID) | ORAL | 1 refills | Status: DC | PRN
Start: 2022-10-27 — End: 2022-11-28

## 2022-10-27 NOTE — Assessment & Plan Note (Addendum)
Reviewed hospital course with patient.  Medications reconciled.  Encouraged probiotics after completion of doxycycline.  Repeat hepatic function, platelets today.

## 2022-10-27 NOTE — Progress Notes (Signed)
Assessment & Plan:  Neck pain Assessment & Plan: Acute on chronic. Advised to hold use with NSAIDs in setting of thrombocytopenia; in setting of elevated hepatic enzymes advised patient to hold on acetaminophen.  We have opted to use Flexeril 5 mg 3 times daily as needed.  Counseled on sedation. consider re starting  gabapentin and physical therapy.   Orders: -     CBC with Differential/Platelet -     Comprehensive metabolic panel -     Cyclobenzaprine HCl; Take 1 tablet (5 mg total) by mouth 3 (three) times daily as needed for muscle spasms.  Dispense: 30 tablet; Refill: 1  Positive serology for Erlichiosis Assessment & Plan: Reviewed hospital course with patient.  Medications reconciled.  Encouraged probiotics after completion of doxycycline.  Repeat hepatic function, platelets today.       Return precautions given.   Risks, benefits, and alternatives of the medications and treatment plan prescribed today were discussed, and patient expressed understanding.   Education regarding symptom management and diagnosis given to patient on AVS either electronically or printed.  No follow-ups on file.  Rennie Plowman, FNP  Subjective:    Patient ID: Erin Aguilar, female    DOB: 05-10-1950, 72 y.o.   MRN: [id number]  CC: Erin Aguilar is a 72 y.o. female who presents today for follow up.   HPI: ED follow-up  She is compliant with doxycycline 100 mg for total of 7 days, will finish today.   She is feeling better.  Appetite has improved.  Endorses feeling weak from hospitalization, improving however everyday.  She complains of chronic neck pain which is more aggregated recently. No numbness or arm weakness. She is using ice without relief. She is not using gabapentin.    No abdominal pain, rash, fever, unusual joint pain.       She is not taking NSAIDs and suspended ASA at this time.   Xr cervical spine 11/2021  with degenerative changes, neural foramina  c5-c6  Patient presented to [facility] ED 10/19/2022 for fever  Admitted 10/19/2022 and discharged 2 days later 10/21/2022 She had a tick bite 2 weeks ago on her abdomen and developed a small rash on her arm.  COVID, flu, and RSV negative.  Concern for tickborne illness in the setting of thrombocytopenia, elevated liver enzymes.  Started on doxycycline 100 mg twice daily.  Tick panel positive for erlichiosis with PCR   Discharged on doxycycline for total of 7 days. Hospital course significant for severe thrombocytopenia leukopenia. WBC 3.5 Platelet 43 Absolute neutrophils 0.8 AST 265 ALT 188  Allergies: Ciprofloxacin, Codeine, Oxycodone, and Trazodone and nefazodone Current Outpatient Medications on File Prior to Visit  Medication Sig Dispense Refill   aspirin 81 MG chewable tablet Chew 81 mg by mouth.     atorvastatin (LIPITOR) 80 MG tablet Take 1 tablet (80 mg total) by mouth daily. 90 tablet 3   baclofen (LIORESAL) 10 MG tablet Take 1 tablet (10 mg total) by mouth 3 (three) times daily. 90 each 2   busPIRone (BUSPAR) 10 MG tablet TAKE (1) TABLET BY MOUTH THREE TIMES DAILY 270 tablet 3   butalbital-apap-caffeine-codeine (FIORICET WITH CODEINE) 50-325-40-30 MG capsule Take 1 capsule by mouth 2 (two) times daily. 60 capsule 5   doxycycline (MONODOX) 100 MG capsule Take 100 mg by mouth 2 (two) times daily.     DULoxetine (CYMBALTA) 60 MG capsule TAKE ONE CAPSULE BY MOUTH ONCE DAILY 90 capsule 1   escitalopram (LEXAPRO) 20 MG tablet  TAKE ONE TABLET BY MOUTH ONCE DAILY 90 tablet 1   esomeprazole (NEXIUM) 40 MG capsule Take 1 capsule (40 mg total) by mouth daily. 90 capsule 3   gabapentin (NEURONTIN) 100 MG capsule Take 1 capsule (100 mg total) by mouth 3 (three) times daily. Increase weekly by 100 mg if needed 90 capsule 3   Multiple Vitamin (MULTIVITAMIN) tablet Take 1 tablet by mouth daily.     pramipexole (MIRAPEX) 0.25 MG tablet TAKE ONE TABLET BY MOUTH AT BEDTIME 90 tablet 1   Current  Facility-Administered Medications on File Prior to Visit  Medication Dose Route Frequency Provider Last Rate Last Admin   0.9 %  sodium chloride infusion  500 mL Intravenous Continuous Danis, Starr Lake III, MD        Review of Systems  Constitutional:  Negative for chills and fever.  Respiratory:  Negative for cough.   Cardiovascular:  Negative for chest pain and palpitations.  Gastrointestinal:  Negative for nausea and vomiting.  Musculoskeletal:  Positive for neck pain.  Neurological:  Negative for weakness and numbness.      Objective:    BP 118/70   Pulse 65   Temp (!) 97.5 F (36.4 C) (Oral)   Ht 5' 2.5" (1.588 m)   Wt 151 lb 6.4 oz (68.7 kg)   SpO2 99%   BMI 27.25 kg/m  BP Readings from Last 3 Encounters:  10/27/22 118/70  09/13/22 116/60  06/15/22 130/82   Wt Readings from Last 3 Encounters:  10/27/22 151 lb 6.4 oz (68.7 kg)  09/13/22 154 lb 3.2 oz (69.9 kg)  06/15/22 157 lb (71.2 kg)    Physical Exam Vitals reviewed.  Constitutional:      Appearance: She is well-developed.  Eyes:     Conjunctiva/sclera: Conjunctivae normal.  Neck:   Cardiovascular:     Rate and Rhythm: Normal rate and regular rhythm.     Pulses: Normal pulses.     Heart sounds: Normal heart sounds.  Pulmonary:     Effort: Pulmonary effort is normal.     Breath sounds: Normal breath sounds. No wheezing, rhonchi or rales.  Musculoskeletal:     Cervical back: Muscular tenderness present. No spinous process tenderness.  Skin:    General: Skin is warm and dry.  Neurological:     Mental Status: She is alert.  Psychiatric:        Speech: Speech normal.        Behavior: Behavior normal.        Thought Content: Thought content normal.

## 2022-10-27 NOTE — Patient Instructions (Signed)
Trial of Flexeril which is a muscle relaxant for neck pain  Do not drive or operate heavy machinery while on muscle relaxant. Please do not drink alcohol. Only take this medication as needed for acute muscle spasm at bedtime. This medication make you feel drowsy so be very careful.  Stop taking if become too drowsy or somnolent as this puts you at risk for falls. Please contact our office with any questions.    Please continue to avoid aspirin, and other anti-inflammatories such as ibuprofen or Aleve and also Tylenol until labs have returned.   You may use ice on your neck 20 minutes twice per day  Nice to meet you.

## 2022-10-27 NOTE — Assessment & Plan Note (Addendum)
Acute on chronic. Advised to hold use with NSAIDs in setting of thrombocytopenia; in setting of elevated hepatic enzymes advised patient to hold on acetaminophen.  We have opted to use Flexeril 5 mg 3 times daily as needed.  Counseled on sedation. consider re starting  gabapentin and physical therapy.

## 2022-10-30 ENCOUNTER — Other Ambulatory Visit: Payer: Self-pay | Admitting: Family

## 2022-10-30 DIAGNOSIS — R899 Unspecified abnormal finding in specimens from other organs, systems and tissues: Secondary | ICD-10-CM

## 2022-11-01 ENCOUNTER — Other Ambulatory Visit: Payer: Self-pay | Admitting: Internal Medicine

## 2022-11-15 NOTE — Progress Notes (Deleted)
Left message with Tim. To have pt give [place] a call back

## 2022-11-16 ENCOUNTER — Telehealth: Payer: Self-pay | Admitting: Internal Medicine

## 2022-11-16 NOTE — Telephone Encounter (Signed)
Spoke with pt, lab appt was scheduled.

## 2022-11-16 NOTE — Telephone Encounter (Signed)
Pt called stating someone called her about lab work results

## 2022-11-19 ENCOUNTER — Other Ambulatory Visit: Payer: Self-pay | Admitting: Internal Medicine

## 2022-11-25 ENCOUNTER — Other Ambulatory Visit: Payer: Self-pay | Admitting: Family

## 2022-11-25 DIAGNOSIS — M542 Cervicalgia: Secondary | ICD-10-CM

## 2022-11-28 ENCOUNTER — Other Ambulatory Visit: Payer: Self-pay | Admitting: Family

## 2022-11-28 NOTE — Telephone Encounter (Signed)
Spoke to pt and she stated that she does have both the Baclofen and the Flexeril but she was not taking both. Pt stated that she will discontinue the baclofen and just take the Flexeril.

## 2022-11-30 ENCOUNTER — Encounter (INDEPENDENT_AMBULATORY_CARE_PROVIDER_SITE_OTHER): Payer: Self-pay

## 2022-12-11 ENCOUNTER — Other Ambulatory Visit: Payer: Medicare PPO

## 2022-12-14 ENCOUNTER — Ambulatory Visit: Payer: Medicare PPO | Admitting: Internal Medicine

## 2022-12-15 ENCOUNTER — Other Ambulatory Visit (INDEPENDENT_AMBULATORY_CARE_PROVIDER_SITE_OTHER): Payer: Medicare PPO

## 2022-12-15 DIAGNOSIS — R7303 Prediabetes: Secondary | ICD-10-CM | POA: Diagnosis not present

## 2022-12-15 DIAGNOSIS — E7849 Other hyperlipidemia: Secondary | ICD-10-CM

## 2022-12-15 DIAGNOSIS — R899 Unspecified abnormal finding in specimens from other organs, systems and tissues: Secondary | ICD-10-CM | POA: Diagnosis not present

## 2022-12-15 LAB — HEPATIC FUNCTION PANEL
ALT: 44 U/L — ABNORMAL HIGH (ref 0–35)
AST: 47 U/L — ABNORMAL HIGH (ref 0–37)
Albumin: 4 g/dL (ref 3.5–5.2)
Alkaline Phosphatase: 84 U/L (ref 39–117)
Bilirubin, Direct: 0.1 mg/dL (ref 0.0–0.3)
Total Bilirubin: 0.4 mg/dL (ref 0.2–1.2)
Total Protein: 7.1 g/dL (ref 6.0–8.3)

## 2022-12-15 LAB — LIPID PANEL
Cholesterol: 128 mg/dL (ref 0–200)
HDL: 57.6 mg/dL (ref >39.00)
LDL Cholesterol: 38 mg/dL (ref 0–99)
NonHDL: 70.48
Total CHOL/HDL Ratio: 2
Triglycerides: 164 mg/dL — ABNORMAL HIGH (ref 0.0–149.0)
VLDL: 32.8 mg/dL (ref 0.0–40.0)

## 2022-12-15 LAB — URINALYSIS, ROUTINE W REFLEX MICROSCOPIC
Bilirubin Urine: NEGATIVE
Hgb urine dipstick: NEGATIVE
Ketones, ur: NEGATIVE
Nitrite: NEGATIVE
RBC / HPF: NONE SEEN (ref 0–?)
Specific Gravity, Urine: 1.015 (ref 1.000–1.030)
Total Protein, Urine: NEGATIVE
Urine Glucose: NEGATIVE
Urobilinogen, UA: 0.2 (ref 0.0–1.0)
pH: 6 (ref 5.0–8.0)

## 2022-12-15 LAB — LDL CHOLESTEROL, DIRECT: Direct LDL: 57 mg/dL

## 2022-12-15 LAB — CBC WITH DIFFERENTIAL/PLATELET
Basophils Absolute: 0 10*3/uL (ref 0.0–0.1)
Basophils Relative: 0.7 % (ref 0.0–3.0)
Eosinophils Absolute: 0.3 10*3/uL (ref 0.0–0.7)
Eosinophils Relative: 5.8 % — ABNORMAL HIGH (ref 0.0–5.0)
HCT: 37.8 % (ref 36.0–46.0)
Hemoglobin: 12.4 g/dL (ref 12.0–15.0)
Lymphocytes Relative: 41.8 % (ref 12.0–46.0)
Lymphs Abs: 1.9 10*3/uL (ref 0.7–4.0)
MCHC: 32.9 g/dL (ref 30.0–36.0)
MCV: 103.5 fl — ABNORMAL HIGH (ref 78.0–100.0)
Monocytes Absolute: 0.4 10*3/uL (ref 0.1–1.0)
Monocytes Relative: 7.5 % (ref 3.0–12.0)
Neutro Abs: 2.1 10*3/uL (ref 1.4–7.7)
Neutrophils Relative %: 44.2 % (ref 43.0–77.0)
Platelets: 197 10*3/uL (ref 150.0–400.0)
RBC: 3.65 Mil/uL — ABNORMAL LOW (ref 3.87–5.11)
RDW: 12.7 % (ref 11.5–15.5)
WBC: 4.6 10*3/uL (ref 4.0–10.5)

## 2022-12-15 LAB — COMPREHENSIVE METABOLIC PANEL
ALT: 44 U/L — ABNORMAL HIGH (ref 0–35)
AST: 47 U/L — ABNORMAL HIGH (ref 0–37)
Albumin: 4 g/dL (ref 3.5–5.2)
Alkaline Phosphatase: 84 U/L (ref 39–117)
BUN: 15 mg/dL (ref 6–23)
CO2: 28 mEq/L (ref 19–32)
Calcium: 9.3 mg/dL (ref 8.4–10.5)
Chloride: 105 mEq/L (ref 96–112)
Creatinine, Ser: 0.75 mg/dL (ref 0.40–1.20)
GFR: 78.42 mL/min (ref >60.00)
Glucose, Bld: 89 mg/dL (ref 70–99)
Potassium: 4.2 mEq/L (ref 3.5–5.1)
Sodium: 140 mEq/L (ref 135–145)
Total Bilirubin: 0.4 mg/dL (ref 0.2–1.2)
Total Protein: 7.1 g/dL (ref 6.0–8.3)

## 2022-12-15 LAB — HEMOGLOBIN A1C: Hgb A1c MFr Bld: 6.1 % (ref 4.6–6.5)

## 2022-12-15 LAB — POTASSIUM: Potassium: 4.2 mEq/L (ref 3.5–5.1)

## 2022-12-15 LAB — MICROALBUMIN / CREATININE URINE RATIO
Creatinine,U: 64.8 mg/dL
Microalb Creat Ratio: 3.3 mg/g (ref 0.0–30.0)
Microalb, Ur: 2.1 mg/dL — ABNORMAL HIGH (ref 0.0–1.9)

## 2022-12-15 LAB — B12 AND FOLATE PANEL
Folate: 23.2 ng/mL (ref >5.9)
Vitamin B-12: 267 pg/mL (ref 211–911)

## 2022-12-16 LAB — IRON,TIBC AND FERRITIN PANEL
%SAT: 42 % (ref 16–45)
Ferritin: 76 ng/mL (ref 16–288)
Iron: 112 ug/dL (ref 45–160)
TIBC: 267 ug/dL (ref 250–450)

## 2022-12-20 ENCOUNTER — Encounter: Payer: Self-pay | Admitting: Internal Medicine

## 2022-12-20 ENCOUNTER — Ambulatory Visit: Payer: Medicare PPO | Admitting: Internal Medicine

## 2022-12-20 VITALS — BP 100/60 | HR 59 | Temp 97.8°F | Ht 62.5 in | Wt 148.8 lb

## 2022-12-20 DIAGNOSIS — R7303 Prediabetes: Secondary | ICD-10-CM

## 2022-12-20 DIAGNOSIS — R768 Other specified abnormal immunological findings in serum: Secondary | ICD-10-CM | POA: Diagnosis not present

## 2022-12-20 DIAGNOSIS — M542 Cervicalgia: Secondary | ICD-10-CM

## 2022-12-20 DIAGNOSIS — Z23 Encounter for immunization: Secondary | ICD-10-CM

## 2022-12-20 DIAGNOSIS — E669 Obesity, unspecified: Secondary | ICD-10-CM

## 2022-12-20 DIAGNOSIS — Z6826 Body mass index (BMI) 26.0-26.9, adult: Secondary | ICD-10-CM | POA: Diagnosis not present

## 2022-12-20 DIAGNOSIS — E538 Deficiency of other specified B group vitamins: Secondary | ICD-10-CM | POA: Diagnosis not present

## 2022-12-20 MED ORDER — ESOMEPRAZOLE MAGNESIUM 40 MG PO CPDR: 40.0000 mg | DELAYED_RELEASE_CAPSULE | Freq: Every day | ORAL | 3 refills | Status: DC

## 2022-12-20 MED ORDER — ATORVASTATIN CALCIUM 80 MG PO TABS: 80.0000 mg | ORAL_TABLET | Freq: Every day | ORAL | 3 refills | Status: DC

## 2022-12-20 MED ORDER — ESCITALOPRAM OXALATE 10 MG PO TABS: 10.0000 mg | ORAL_TABLET | Freq: Every day | ORAL | Status: AC

## 2022-12-20 MED ORDER — CYANOCOBALAMIN 1000 MCG/ML IJ SOLN: 1000.0000 ug | INTRAMUSCULAR | 3 refills | Status: DC

## 2022-12-20 MED ORDER — "SYRINGE 25G X 1"" 3 ML MISC": 0 refills | Status: DC

## 2022-12-20 MED ORDER — PRAMIPEXOLE DIHYDROCHLORIDE 0.25 MG PO TABS: 0.2500 mg | ORAL_TABLET | Freq: Every day | ORAL | 1 refills | Status: DC

## 2022-12-20 MED ORDER — CYANOCOBALAMIN 1000 MCG/ML IJ SOLN: 1000.0000 ug | INTRAMUSCULAR | 0 refills | Status: DC

## 2022-12-20 MED ORDER — CYCLOBENZAPRINE HCL 10 MG PO TABS
10.0000 mg | ORAL_TABLET | Freq: Three times a day (TID) | ORAL | 1 refills | Status: DC | PRN
Start: 2022-12-20 — End: 2023-03-19

## 2022-12-20 NOTE — Progress Notes (Signed)
Subjective:  Patient ID: Erin Aguilar, female    DOB: [birth year]  Age: 72 y.o. MRN: [id number]  CC: The primary encounter diagnosis was Encounter for immunization. Diagnoses of Obesity (BMI 30.0-34.9), Neck pain, Positive serology for Erlichiosis, Prediabetes, and B12 deficiency were also pertinent to this visit.   HPI SAYDA WENTZELL presents for  Chief Complaint  Patient presents with   Medical Management of Chronic Issues    3 month follow up    1) hospitalized  for two days after presenting on  4  July for ehrlichiosis with headaches,  arm rash,  chills,  thrombocytopenia and hepatitis, profound weakness  secondary to tick bite. Was treated with doxy for 7 days.  AST/ALT still minimally elevated,  platelets normal .  Strength returned after one week but remains fatigued.  Reviewed hospitalization and recent labs  Lab Results  Component Value Date   IRON 112 12/15/2022   TIBC 267 12/15/2022   FERRITIN 76 12/15/2022     2) obesity:  no longer taking ozempic but still losing weight.  Likely due to #1.  Eating mini meals .  3) chronic intermittent vertigo with sudden position change.  No syncope or palpitations   Lab Results  Component Value Date   WBC 4.6 12/15/2022   HGB 12.4 12/15/2022   HCT 37.8 12/15/2022   MCV 103.5 (H) 12/15/2022   PLT 197.0 12/15/2022   Lab Results  Component Value Date   ALT 44 (H) 12/15/2022   ALT 44 (H) 12/15/2022   AST 47 (H) 12/15/2022   AST 47 (H) 12/15/2022   ALKPHOS 84 12/15/2022   ALKPHOS 84 12/15/2022   BILITOT 0.4 12/15/2022   BILITOT 0.4 12/15/2022         Outpatient Medications Prior to Visit  Medication Sig Dispense Refill   aspirin 81 MG chewable tablet Chew 81 mg by mouth.     butalbital-apap-caffeine-codeine (FIORICET WITH CODEINE) 50-325-40-30 MG capsule Take 1 capsule by mouth 2 (two) times daily. 60 capsule 5   DULoxetine (CYMBALTA) 60 MG capsule TAKE ONE CAPSULE BY MOUTH ONCE DAILY 90 capsule 1   gabapentin  (NEURONTIN) 100 MG capsule TAKE ONE CAPSULE BY MOUTH THREE TIMES DAILY increase weekly by 100mg  if needed 90 capsule 3   Multiple Vitamin (MULTIVITAMIN) tablet Take 1 tablet by mouth daily.     atorvastatin (LIPITOR) 80 MG tablet Take 1 tablet (80 mg total) by mouth daily. 90 tablet 3   cyclobenzaprine (FLEXERIL) 5 MG tablet TAKE ONE TABLET BY MOUTH THREE TIMES DAILY AS NEEDED FOR MUSCLE SPASMS 30 tablet 1   escitalopram (LEXAPRO) 20 MG tablet TAKE ONE TABLET BY MOUTH ONCE DAILY 90 tablet 1   esomeprazole (NEXIUM) 40 MG capsule Take 1 capsule (40 mg total) by mouth daily. 90 capsule 3   pramipexole (MIRAPEX) 0.25 MG tablet TAKE ONE TABLET BY MOUTH AT BEDTIME 90 tablet 1   busPIRone (BUSPAR) 10 MG tablet TAKE (1) TABLET BY MOUTH THREE TIMES DAILY (Patient not taking: Reported on 12/20/2022) 270 tablet 3   doxycycline (MONODOX) 100 MG capsule Take 100 mg by mouth 2 (two) times daily. (Patient not taking: Reported on 12/20/2022)     Facility-Administered Medications Prior to Visit  Medication Dose Route Frequency Provider Last Rate Last Admin   0.9 %  sodium chloride infusion  500 mL Intravenous Continuous Danis, Andreas Blower, MD        Review of Systems;  Patient denies headache, fevers, malaise, unintentional weight loss,  skin rash, eye pain, sinus congestion and sinus pain, sore throat, dysphagia,  hemoptysis , cough, dyspnea, wheezing, chest pain, palpitations, orthopnea, edema, abdominal pain, nausea, melena, diarrhea, constipation, flank pain, dysuria, hematuria, urinary  Frequency, nocturia, numbness, tingling, seizures,  Focal weakness, Loss of consciousness,  Tremor, insomnia, depression, anxiety, and suicidal ideation.      Objective:  BP 100/60   Pulse (!) 59   Temp 97.8 F (36.6 C) (Oral)   Ht 5' 2.5" (1.588 m)   Wt 148 lb 12.8 oz (67.5 kg)   SpO2 98%   BMI 26.78 kg/m   BP Readings from Last 3 Encounters:  12/20/22 100/60  10/27/22 118/70  09/13/22 116/60    Wt Readings from  Last 3 Encounters:  12/20/22 148 lb 12.8 oz (67.5 kg)  10/27/22 151 lb 6.4 oz (68.7 kg)  09/13/22 154 lb 3.2 oz (69.9 kg)    Physical Exam Vitals reviewed.  Constitutional:      General: She is not in acute distress.    Appearance: Normal appearance. She is normal weight. She is not ill-appearing, toxic-appearing or diaphoretic.  HENT:     Head: Normocephalic.  Eyes:     General: No scleral icterus.       Right eye: No discharge.        Left eye: No discharge.     Conjunctiva/sclera: Conjunctivae normal.  Cardiovascular:     Rate and Rhythm: Normal rate and regular rhythm.     Heart sounds: Normal heart sounds.  Pulmonary:     Effort: Pulmonary effort is normal. No respiratory distress.     Breath sounds: Normal breath sounds.  Musculoskeletal:        General: Normal range of motion.  Skin:    General: Skin is warm and dry.  Neurological:     General: No focal deficit present.     Mental Status: She is alert and oriented to person, place, and time. Mental status is at baseline.  Psychiatric:        Mood and Affect: Mood normal.        Behavior: Behavior normal.        Thought Content: Thought content normal.        Judgment: Judgment normal.   Lab Results  Component Value Date   HGBA1C 6.1 12/15/2022   HGBA1C 6.1 06/15/2022   HGBA1C 6.2 11/07/2021    Lab Results  Component Value Date   CREATININE 0.75 12/15/2022   CREATININE 0.71 10/27/2022   CREATININE 0.90 06/15/2022    Lab Results  Component Value Date   WBC 4.6 12/15/2022   HGB 12.4 12/15/2022   HCT 37.8 12/15/2022   PLT 197.0 12/15/2022   GLUCOSE 89 12/15/2022   CHOL 128 12/15/2022   TRIG 164.0 (H) 12/15/2022   HDL 57.60 12/15/2022   LDLDIRECT 57.0 12/15/2022   LDLCALC 38 12/15/2022   ALT 44 (H) 12/15/2022   ALT 44 (H) 12/15/2022   AST 47 (H) 12/15/2022   AST 47 (H) 12/15/2022   NA 140 12/15/2022   K 4.2 12/15/2022   K 4.2 12/15/2022   CL 105 12/15/2022   CREATININE 0.75 12/15/2022   BUN 15  12/15/2022   CO2 28 12/15/2022   TSH 2.35 06/15/2022   HGBA1C 6.1 12/15/2022   MICROALBUR 2.1 (H) 12/15/2022    MM 3D SCREEN BREAST BILATERAL  Result Date: 02/09/2022 CLINICAL DATA:  Screening. EXAM: DIGITAL SCREENING BILATERAL MAMMOGRAM WITH TOMOSYNTHESIS AND CAD TECHNIQUE: Bilateral screening digital craniocaudal and mediolateral  oblique mammograms were obtained. Bilateral screening digital breast tomosynthesis was performed. The images were evaluated with computer-aided detection. COMPARISON:  Previous exam(s). ACR Breast Density Category c: The breast tissue is heterogeneously dense, which may obscure small masses. FINDINGS: There are no findings suspicious for malignancy. IMPRESSION: No mammographic evidence of malignancy. A result letter of this screening mammogram will be mailed directly to the patient. RECOMMENDATION: Screening mammogram in one year. (Code:SM-B-01Y) BI-RADS CATEGORY  1: Negative. Electronically Signed   By: Annia Belt M.D.   On: 02/09/2022 08:19    Assessment & Plan:  .Encounter for immunization -     Flu Vaccine Trivalent High Dose (Fluad)  Obesity (BMI 30.0-34.9) Assessment & Plan: SHE HAS LOS 43 LBS SINCE FEB 2023 using GLP agononist and BMI is now 26    Neck pain -     Cyclobenzaprine HCl; Take 1 tablet (10 mg total) by mouth 3 (three) times daily as needed for muscle spasms.  Dispense: 90 tablet; Refill: 1  Positive serology for Erlichiosis Assessment & Plan: Diagnosed during ER evaluation for headache, malaise , fatigue in the setting of recent tick bite.  Will return for repeat titres in 2 weeks   Orders: -     Ehrlichia antibody panel; Future -     Hepatic function panel; Future  Prediabetes Assessment & Plan: Improved with management of  obesity.  She has lost 40 lbs with wegovy but was unable to continue any GLP agonist due to insurance nonpayment. She contiues to lose weight intentionally through dietary modifications.    Lab Results   Component Value Date   HGBA1C 6.1 12/15/2022      B12 deficiency Assessment & Plan: Continue supplementation.  Increase dosing to weekly x 4 for level < 300   Lab Results  Component Value Date   VITAMINB12 267 12/15/2022      Other orders -     Atorvastatin Calcium; Take 1 tablet (80 mg total) by mouth daily.  Dispense: 90 tablet; Refill: 3 -     Escitalopram Oxalate; Take 1 tablet (10 mg total) by mouth daily for 7 days. -     Esomeprazole Magnesium; Take 1 capsule (40 mg total) by mouth daily.  Dispense: 90 capsule; Refill: 3 -     Pramipexole Dihydrochloride; Take 1 tablet (0.25 mg total) by mouth at bedtime.  Dispense: 90 tablet; Refill: 1 -     Cyanocobalamin; Inject 1 mL (1,000 mcg total) into the muscle once a week.  Dispense: 4 mL; Refill: 0 -     Cyanocobalamin; Inject 1 mL (1,000 mcg total) into the muscle every 30 (thirty) days.  Dispense: 3 mL; Refill: 3 -     Syringe; Use for b12 injections  Dispense: 50 each; Refill: 0     I provided 38 minutes of face-to-face time during this encounter reviewing patient's last visit with me, patient's  most recent hospitalization,   recent surgical and non surgical procedures, previous  labs and imaging studies, counseling on currently addressed issues,  and post visit ordering to diagnostics and therapeutics .   Follow-up: Return in about 3 months (around 03/21/2023).   Sherlene Shams, MD

## 2022-12-20 NOTE — Patient Instructions (Addendum)
Reduce the lexapro to 1/2 tablet daily for one week,  then stop    Continue THE cymbalta    Your B12 level is LOW AGAIN.  PLEASE GIVE YOURSELF A WEEKLY INJECTION FOR 4 WEEKS,  THEN CONTINUE MONTHLY INJECTIONS FOR LIFE.   RETURN FOR FOLLOW UP LABS IN 1-2 WEEKS (LIVER ENZYMES AND EHRLICHIA ANTIBODY TITER)

## 2022-12-20 NOTE — Assessment & Plan Note (Addendum)
SHE HAS LOS 43 LBS SINCE FEB 2023 using GLP agononist and BMI is now 66

## 2022-12-21 NOTE — Assessment & Plan Note (Signed)
Continue supplementation.  Increase dosing to weekly x 4 for level < 300   Lab Results  Component Value Date   VITAMINB12 267 12/15/2022

## 2022-12-21 NOTE — Assessment & Plan Note (Signed)
Diagnosed during ER evaluation for headache, malaise , fatigue in the setting of recent tick bite.  Will return for repeat titres in 2 weeks

## 2022-12-21 NOTE — Assessment & Plan Note (Signed)
Improved with management of  obesity.  She has lost 40 lbs with wegovy but was unable to continue any GLP agonist due to insurance nonpayment. She contiues to lose weight intentionally through dietary modifications.    Lab Results  Component Value Date   HGBA1C 6.1 12/15/2022

## 2022-12-25 DIAGNOSIS — H34831 Tributary (branch) retinal vein occlusion, right eye, with macular edema: Secondary | ICD-10-CM | POA: Diagnosis not present

## 2023-01-04 ENCOUNTER — Other Ambulatory Visit (INDEPENDENT_AMBULATORY_CARE_PROVIDER_SITE_OTHER): Payer: Medicare PPO

## 2023-01-04 DIAGNOSIS — R768 Other specified abnormal immunological findings in serum: Secondary | ICD-10-CM

## 2023-01-04 LAB — HEPATIC FUNCTION PANEL
ALT: 36 U/L — ABNORMAL HIGH (ref 0–35)
AST: 37 U/L (ref 0–37)
Albumin: 4 g/dL (ref 3.5–5.2)
Alkaline Phosphatase: 80 U/L (ref 39–117)
Bilirubin, Direct: 0.1 mg/dL (ref 0.0–0.3)
Total Bilirubin: 0.5 mg/dL (ref 0.2–1.2)
Total Protein: 7.2 g/dL (ref 6.0–8.3)

## 2023-01-09 DIAGNOSIS — M722 Plantar fascial fibromatosis: Secondary | ICD-10-CM | POA: Diagnosis not present

## 2023-01-11 LAB — EHRLICHIA ANTIBODY PANEL
E. CHAFFEENSIS AB IGG: 1:64 {titer} — ABNORMAL HIGH
E. CHAFFEENSIS AB IGM: <1:20 {titer}

## 2023-01-15 ENCOUNTER — Other Ambulatory Visit: Payer: Self-pay | Admitting: Internal Medicine

## 2023-01-15 DIAGNOSIS — Z1231 Encounter for screening mammogram for malignant neoplasm of breast: Secondary | ICD-10-CM

## 2023-01-22 ENCOUNTER — Telehealth: Payer: Self-pay | Admitting: Internal Medicine

## 2023-01-22 DIAGNOSIS — R7303 Prediabetes: Secondary | ICD-10-CM | POA: Diagnosis not present

## 2023-01-22 DIAGNOSIS — M79671 Pain in right foot: Secondary | ICD-10-CM | POA: Diagnosis not present

## 2023-01-22 DIAGNOSIS — M810 Age-related osteoporosis without current pathological fracture: Secondary | ICD-10-CM | POA: Diagnosis not present

## 2023-01-22 NOTE — Telephone Encounter (Signed)
Copied from CRM [id number]. Topic: Medicare AWV >> Jan 22, 2023  9:36 AM Payton Doughty wrote: Reason for CRM: Called LVM 01/22/2023 to schedule AWV   Verlee Rossetti; Care Guide Ambulatory Clinical Support [place] [facility] Direct Dial: [phone]

## 2023-01-24 DIAGNOSIS — Z859 Personal history of malignant neoplasm, unspecified: Secondary | ICD-10-CM | POA: Diagnosis not present

## 2023-01-24 DIAGNOSIS — Z86018 Personal history of other benign neoplasm: Secondary | ICD-10-CM | POA: Diagnosis not present

## 2023-01-24 DIAGNOSIS — L578 Other skin changes due to chronic exposure to nonionizing radiation: Secondary | ICD-10-CM | POA: Diagnosis not present

## 2023-01-24 DIAGNOSIS — Z872 Personal history of diseases of the skin and subcutaneous tissue: Secondary | ICD-10-CM | POA: Diagnosis not present

## 2023-02-01 ENCOUNTER — Other Ambulatory Visit: Payer: Self-pay | Admitting: Internal Medicine

## 2023-02-12 ENCOUNTER — Ambulatory Visit
Admission: RE | Admit: 2023-02-12 | Discharge: 2023-02-12 | Disposition: A | Discharge status: Home / Self Care | Payer: Medicare PPO | Source: Ambulatory Visit | Attending: Internal Medicine | Admitting: Internal Medicine

## 2023-02-12 DIAGNOSIS — Z1231 Encounter for screening mammogram for malignant neoplasm of breast: Secondary | ICD-10-CM | POA: Diagnosis not present

## 2023-02-28 DIAGNOSIS — H34831 Tributary (branch) retinal vein occlusion, right eye, with macular edema: Secondary | ICD-10-CM | POA: Diagnosis not present

## 2023-03-15 ENCOUNTER — Other Ambulatory Visit: Payer: Self-pay | Admitting: Internal Medicine

## 2023-03-15 DIAGNOSIS — M542 Cervicalgia: Secondary | ICD-10-CM

## 2023-03-19 ENCOUNTER — Other Ambulatory Visit: Payer: Self-pay | Admitting: Internal Medicine

## 2023-03-19 DIAGNOSIS — M542 Cervicalgia: Secondary | ICD-10-CM

## 2023-03-26 ENCOUNTER — Ambulatory Visit (INDEPENDENT_AMBULATORY_CARE_PROVIDER_SITE_OTHER): Payer: Medicare PPO | Admitting: Internal Medicine

## 2023-03-26 ENCOUNTER — Encounter: Payer: Self-pay | Admitting: Internal Medicine

## 2023-03-26 VITALS — BP 122/72 | HR 64 | Ht 62.5 in | Wt 151.8 lb

## 2023-03-26 DIAGNOSIS — E7849 Other hyperlipidemia: Secondary | ICD-10-CM | POA: Diagnosis not present

## 2023-03-26 DIAGNOSIS — R7303 Prediabetes: Secondary | ICD-10-CM | POA: Diagnosis not present

## 2023-03-26 DIAGNOSIS — E66811 Obesity, class 1: Secondary | ICD-10-CM | POA: Diagnosis not present

## 2023-03-26 MED ORDER — GABAPENTIN 100 MG PO CAPS: ORAL_CAPSULE | ORAL | 3 refills | Status: DC

## 2023-03-26 NOTE — Assessment & Plan Note (Signed)
SHE HAS LOS 43 LBS SINCE FEB 2023 using GLP agononist and BMI is now 26 .  She has maintained the weight loss despite no longer taking GLP 1 agonist

## 2023-03-26 NOTE — Patient Instructions (Addendum)
  Natural appetite supression can be achieved  by taking a bulk forming laxative  (Benefiber, Citrcucel, Metamucil or Fibercon)  with a 16 uonce glass of water 30 minutes prior to your biggest meal.     Your fioricet prescriptions have been written for #60 per month since May ,  NOT #48   WE WILL REPEAT LABS IN March
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
## 2023-04-20 ENCOUNTER — Telehealth: Payer: Self-pay

## 2023-04-20 NOTE — Telephone Encounter (Signed)
 LMTCB

## 2023-04-20 NOTE — Telephone Encounter (Signed)
 Copied from CRM [id number]. Topic: Clinical - Medication Question >> Apr 20, 2023  9:29 AM Leotis ORN wrote: Reason for CRM: Reason for CRM: Pts husband was diagnosed with covid, since she was exposed and having symptoms she is wanting to have an antiviral  called in she does not want to get out  of her house callback# [phone]

## 2023-04-20 NOTE — Telephone Encounter (Signed)
 Spoke with pt's husband and informed him of Dr. Melina Schools message below.

## 2023-04-20 NOTE — Telephone Encounter (Signed)
 Copied from CRM [id number]. Topic: Clinical - Medication Question >> Apr 20, 2023  2:27 PM Denese Killings wrote: Reason for CRM: Patient is returning a call from the nurse and is requesting a callback

## 2023-04-20 NOTE — Telephone Encounter (Signed)
 See previous message

## 2023-04-25 DIAGNOSIS — H34831 Tributary (branch) retinal vein occlusion, right eye, with macular edema: Secondary | ICD-10-CM | POA: Diagnosis not present

## 2023-06-14 ENCOUNTER — Other Ambulatory Visit (HOSPITAL_COMMUNITY): Payer: Self-pay

## 2023-06-18 ENCOUNTER — Other Ambulatory Visit: Payer: Self-pay | Admitting: Internal Medicine

## 2023-06-25 ENCOUNTER — Encounter: Payer: Self-pay | Admitting: Internal Medicine

## 2023-06-25 ENCOUNTER — Ambulatory Visit: Payer: Medicare PPO | Admitting: Internal Medicine

## 2023-06-25 VITALS — BP 116/78 | HR 65 | Ht 62.5 in | Wt 155.6 lb

## 2023-06-25 DIAGNOSIS — M542 Cervicalgia: Secondary | ICD-10-CM

## 2023-06-25 DIAGNOSIS — E7800 Pure hypercholesterolemia, unspecified: Secondary | ICD-10-CM | POA: Diagnosis not present

## 2023-06-25 DIAGNOSIS — G44229 Chronic tension-type headache, not intractable: Secondary | ICD-10-CM | POA: Diagnosis not present

## 2023-06-25 DIAGNOSIS — G894 Chronic pain syndrome: Secondary | ICD-10-CM | POA: Diagnosis not present

## 2023-06-25 DIAGNOSIS — I700 Atherosclerosis of aorta: Secondary | ICD-10-CM | POA: Diagnosis not present

## 2023-06-25 DIAGNOSIS — R944 Abnormal results of kidney function studies: Secondary | ICD-10-CM | POA: Diagnosis not present

## 2023-06-25 DIAGNOSIS — R7303 Prediabetes: Secondary | ICD-10-CM | POA: Diagnosis not present

## 2023-06-25 DIAGNOSIS — E538 Deficiency of other specified B group vitamins: Secondary | ICD-10-CM | POA: Diagnosis not present
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
## 2023-06-25 NOTE — Patient Instructions (Signed)
 Ask your pharmacist why you are only receiving #48 Fioricet tablets  per month   I encourage you to resume walking daily for exercise.  Work your way up to 30 minutes

## 2023-06-26 LAB — LIPID PANEL
Cholesterol: 135 mg/dL (ref 0–200)
HDL: 69.6 mg/dL (ref >39.00)
LDL Cholesterol: 42 mg/dL (ref 0–99)
NonHDL: 65.86
Total CHOL/HDL Ratio: 2
Triglycerides: 121 mg/dL (ref 0.0–149.0)
VLDL: 24.2 mg/dL (ref 0.0–40.0)

## 2023-06-26 LAB — COMPREHENSIVE METABOLIC PANEL
ALT: 30 U/L (ref 0–35)
AST: 33 U/L (ref 0–37)
Albumin: 4 g/dL (ref 3.5–5.2)
Alkaline Phosphatase: 84 U/L (ref 39–117)
BUN: 17 mg/dL (ref 6–23)
CO2: 25 meq/L (ref 19–32)
Calcium: 9.2 mg/dL (ref 8.4–10.5)
Chloride: 106 meq/L (ref 96–112)
Creatinine, Ser: 0.96 mg/dL (ref 0.40–1.20)
GFR: 58.09 mL/min — ABNORMAL LOW (ref >60.00)
Glucose, Bld: 92 mg/dL (ref 70–99)
Potassium: 4.6 meq/L (ref 3.5–5.1)
Sodium: 137 meq/L (ref 135–145)
Total Bilirubin: 0.3 mg/dL (ref 0.2–1.2)
Total Protein: 7 g/dL (ref 6.0–8.3)

## 2023-06-26 LAB — TSH: TSH: 1.97 u[IU]/mL (ref 0.35–5.50)

## 2023-06-26 LAB — HEMOGLOBIN A1C: Hgb A1c MFr Bld: 6.2 % (ref 4.6–6.5)

## 2023-06-26 LAB — LDL CHOLESTEROL, DIRECT: Direct LDL: 48 mg/dL

## 2023-06-26 NOTE — Assessment & Plan Note (Signed)
 chronic. She has in the past been advised to hold use with NSAIDs in setting of thrombocytopenia; ilevated hepatic enzymes but been managing with iburpfoen and fioricet prn   Lab Results  Component Value Date   ALT 36 (H) 01/04/2023   AST 37 01/04/2023   ALKPHOS 80 01/04/2023   BILITOT 0.5 01/04/2023   Lab Results  Component Value Date   WBC 4.6 12/15/2022   HGB 12.4 12/15/2022   HCT 37.8 12/15/2022   MCV 103.5 (H) 12/15/2022   PLT 197.0 12/15/2022

## 2023-06-26 NOTE — Assessment & Plan Note (Signed)
 Advised to avoid daily use of NSAIDs given history of bariatric surgery and recent decline  in GFR .  Tylenol advised.  cymbalta started in May for depression/anxiety with concurrent improvement in pain management .  Fioricet added for daily use to reduce NSAID use

## 2023-06-26 NOTE — Assessment & Plan Note (Addendum)
 Present for over 5 years wih previous excessive use of fioricet.  MRI brain reviewed from 2022  her pain is noow managed with reduce fioricet use to 2 daily  and maximize tylenol use , muscle relaxer

## 2023-06-27 ENCOUNTER — Encounter: Payer: Self-pay | Admitting: Internal Medicine

## 2023-06-27 NOTE — Addendum Note (Signed)
 Addended by: Sherlene Shams on: 06/27/2023 07:38 AM   Modules accepted: Orders

## 2023-07-04 ENCOUNTER — Other Ambulatory Visit: Payer: Self-pay | Admitting: Internal Medicine

## 2023-07-04 DIAGNOSIS — M542 Cervicalgia: Secondary | ICD-10-CM

## 2023-07-05 DIAGNOSIS — H34831 Tributary (branch) retinal vein occlusion, right eye, with macular edema: Secondary | ICD-10-CM | POA: Diagnosis not present

## 2023-09-05 ENCOUNTER — Other Ambulatory Visit
Admission: RE | Admit: 2023-09-05 | Discharge: 2023-09-05 | Disposition: A | Discharge status: Home / Self Care | Source: Ambulatory Visit | Attending: Ophthalmology | Admitting: Ophthalmology

## 2023-09-05 DIAGNOSIS — H5711 Ocular pain, right eye: Secondary | ICD-10-CM | POA: Diagnosis not present

## 2023-09-05 DIAGNOSIS — H34831 Tributary (branch) retinal vein occlusion, right eye, with macular edema: Secondary | ICD-10-CM | POA: Diagnosis not present

## 2023-09-05 DIAGNOSIS — Z0100 Encounter for examination of eyes and vision without abnormal findings: Secondary | ICD-10-CM | POA: Diagnosis not present

## 2023-09-05 DIAGNOSIS — G4452 New daily persistent headache (NDPH): Secondary | ICD-10-CM | POA: Diagnosis not present

## 2023-09-05 DIAGNOSIS — H43813 Vitreous degeneration, bilateral: Secondary | ICD-10-CM | POA: Diagnosis not present

## 2023-09-05 LAB — CBC
HCT: 36.7 % (ref 36.0–46.0)
Hemoglobin: 12.5 g/dL (ref 12.0–15.0)
MCH: 34.2 pg — ABNORMAL HIGH (ref 26.0–34.0)
MCHC: 34.1 g/dL (ref 30.0–36.0)
MCV: 100.3 fL — ABNORMAL HIGH (ref 80.0–100.0)
Platelets: 185 10*3/uL (ref 150–400)
RBC: 3.66 MIL/uL — ABNORMAL LOW (ref 3.87–5.11)
RDW: 12.3 % (ref 11.5–15.5)
WBC: 4.5 10*3/uL (ref 4.0–10.5)
nRBC: 0 % (ref 0.0–0.2)

## 2023-09-05 LAB — C-REACTIVE PROTEIN: CRP: 0.6 mg/dL (ref <1.0)

## 2023-09-05 LAB — SEDIMENTATION RATE: Sed Rate: 29 mm/h (ref 0–30)

## 2023-09-06 ENCOUNTER — Other Ambulatory Visit: Payer: Self-pay | Admitting: Internal Medicine

## 2023-09-06 DIAGNOSIS — M542 Cervicalgia: Secondary | ICD-10-CM

## 2023-09-06 NOTE — Telephone Encounter (Signed)
 Refilled: 03/19/2023 Last OV: 06/25/2023 Next OV: 12/26/2023

## 2023-11-09 ENCOUNTER — Other Ambulatory Visit: Payer: Self-pay | Admitting: Internal Medicine

## 2023-11-13 ENCOUNTER — Ambulatory Visit: Admitting: *Deleted

## 2023-11-13 VITALS — Ht 62.5 in | Wt 158.0 lb

## 2023-11-13 DIAGNOSIS — Z0000 Encounter for general adult medical examination without abnormal findings: Secondary | ICD-10-CM

## 2023-11-13 NOTE — Patient Instructions (Signed)
 Erin Aguilar , Thank you for taking time out of your busy schedule to complete your Annual Wellness Visit with me. I enjoyed our conversation and look forward to speaking with you again next year. I, as well as your care team,  appreciate your ongoing commitment to your health goals. Please review the following plan we discussed and let me know if I can assist you in the future. Your Game plan/ To Do List    Referrals: If you haven't heard from the office you've been referred to, please reach out to them at the phone provided.  Remember to update your tetanus vaccine at your pharmacy and get an annually flu and covid vaccine. Follow up Visits: Next Medicare AWV with our clinical staff: 11/17/24 @ 10:10   Have you seen your provider in the last 6 months (3 months if uncontrolled diabetes)? Yes Next Office Visit with your provider: 12/26/23  Clinician Recommendations:  Aim for 30 minutes of exercise or brisk walking, 6-8 glasses of water, and 5 servings of fruits and vegetables each day.       This is a list of the screening recommended for you and due dates:  Health Maintenance  Topic Date Due   DTaP/Tdap/Td vaccine (2 - Td or Tdap) 01/23/2023   COVID-19 Vaccine (8 - 2024-25 season) 03/30/2023   Flu Shot  11/16/2023   Mammogram  02/12/2024   Medicare Annual Wellness Visit  11/12/2024   Colon Cancer Screening  12/28/2025   Pneumococcal Vaccine for age over 69  Completed   Hepatitis B Vaccine  Completed   DEXA scan (bone density measurement)  Completed   Hepatitis C Screening  Completed   Zoster (Shingles) Vaccine  Completed   HPV Vaccine  Aged Out   Meningitis B Vaccine  Aged Out    Advanced directives: (Copy Requested) Please bring a copy of your health care power of attorney and living will to the office to be added to your chart at your convenience. You can mail to [facility] [address] [place], [place] [address] or email to [email or web address] Advance Care  Planning is important because it:  [x]  Makes sure you receive the medical care that is consistent with your values, goals, and preferences  [x]  It provides guidance to your family and loved ones and reduces their decisional burden about whether or not they are making the right decisions based on your wishes.

## 2023-11-13 NOTE — Progress Notes (Signed)
 Subjective:   Erin Aguilar is a 73 y.o. who presents for a Medicare Wellness preventive visit.  As a reminder, Annual Wellness Visits don't include a physical exam, and some assessments may be limited, especially if this visit is performed virtually. We may recommend an in-person follow-up visit with your provider if needed.  Visit Complete: Virtual I connected with  [name] on 11/13/23 by a audio enabled telemedicine application and verified that I am speaking with the correct person using two identifiers.  Patient Location: Home  Provider Location: Home Office  I discussed the limitations of evaluation and management by telemedicine. The patient expressed understanding and agreed to proceed.  Vital Signs: Because this visit was a virtual/telehealth visit, some criteria may be missing or patient reported. Any vitals not documented were not able to be obtained and vitals that have been documented are patient reported.  VideoDeclined- This patient declined Librarian, academic. Therefore the visit was completed with audio only.  Persons Participating in Visit: Patient.  AWV Questionnaire: No: Patient Medicare AWV questionnaire was not completed prior to this visit.  Cardiac Risk Factors include: advanced age (>55men, >36 women)     Objective:    Today's Vitals   11/13/23 0928  Weight: 158 lb (71.7 kg)  Height: 5' 2.5 (1.588 m)   Body mass index is 28.44 kg/m.     11/13/2023    9:42 AM 01/18/2022   10:55 AM 01/10/2021    3:40 PM 01/08/2020    1:58 PM 12/25/2018   11:11 AM 12/10/2017   10:13 AM 09/12/2017    9:38 AM  Advanced Directives  Does Patient Have a Medical Advance Directive? Yes Yes Yes Yes Yes Yes  Yes   Type of Estate agent of [place];Living will Healthcare Power of [place];Living will  Healthcare Power of [place];Living will Healthcare Power of [place];Living will Living will;Healthcare Power of [employer] Power of [place];Living will  Does patient want to make changes to medical advance directive?  No - Patient declined No - Patient declined No - Patient declined No - Patient declined No - Patient declined    Copy of Healthcare Power of Attorney in Chart? No - copy requested Yes - validated most recent copy scanned in chart (See row information)  Yes - validated most recent copy scanned in chart (See row information) Yes - validated most recent copy scanned in chart (See row information) Yes  Yes      Data saved with a previous flowsheet row definition    Current Medications (verified) Outpatient Encounter Medications as of 11/13/2023  Medication Sig   aspirin  81 MG chewable tablet Chew 81 mg by mouth.   atorvastatin  (LIPITOR) 80 MG tablet Take 1 tablet (80 mg total) by mouth daily.   butalbital -apap-caffeine -codeine (FIORICET WITH CODEINE) 50-325-40-30 MG capsule Take 1 capsule by mouth every 6 (six) hours as needed for headache.   cyclobenzaprine  (FLEXERIL ) 10 MG tablet TAKE ONE TABLET BY MOUTH THREE TIMES DAILY AS NEEDED FOR MUSCLE SPASMS   DULoxetine  (CYMBALTA ) 60 MG capsule TAKE ONE CAPSULE BY MOUTH ONCE DAILY   esomeprazole  (NEXIUM ) 40 MG capsule Take 1 capsule (40 mg total) by mouth daily.   gabapentin  (NEURONTIN ) 100 MG capsule TAKE ONE CAPSULE BY MOUTH THREE TIMES DAILY (INCREASE BY 100MG  WEEKLY IF NEEDED)   Multiple Vitamin (MULTIVITAMIN) tablet Take 1 tablet by mouth daily.   pramipexole  (MIRAPEX ) 0.25 MG tablet Take 1 tablet (0.25 mg total) by mouth at  bedtime.   escitalopram  (LEXAPRO ) 10 MG tablet Take 1 tablet (10 mg total) by mouth daily for 7 days. (Patient not taking: Reported on 11/13/2023)   Facility-Administered Encounter Medications as of 11/13/2023  Medication   0.9 %  sodium chloride  infusion    Allergies (verified) Ciprofloxacin , Oxycodone, and Trazodone  and nefazodone   History: Past Medical History:  Diagnosis Date   Allergy    Hay fever   Arthritis     Chicken pox    Colon polyp    Diverticulitis    GERD (gastroesophageal reflux disease)    H/O: pertussis    Headache(784.0)    Hemochromatosis carrier    sister has disease   Hemorrhoids, internal, with bleeding    occasional   Hyperlipidemia    Migraines    UTI (lower urinary tract infection)    Past Surgical History:  Procedure Laterality Date   ABDOMINAL HYSTERECTOMY  1988   CHOLECYSTECTOMY  1976   GASTRIC BYPASS  03/2010   HERNIA REPAIR  2008   ventral ,  from scar tissue, [place]   JOINT REPLACEMENT Bilateral 02/26/2014   Dr. Elgin Bushman   Family History  Problem Relation Age of Onset   Arthritis Mother        rheumatoid    Cancer Mother        lymphoma   Early death Mother 60       lymphoma   Arthritis Father    Stroke Father    Cancer Sister 51       cervical ca   Kidney disease Brother    Cancer Brother        lung ca,  smoker   Arthritis Maternal Grandmother    Heart disease Maternal Grandmother    Diabetes Maternal Grandmother    Arthritis Maternal Grandfather    Heart disease Maternal Grandfather    Diabetes Maternal Grandfather    Breast cancer Neg Hx    Social History   Socioeconomic History   Marital status: Married    Spouse name: Not on file   Number of children: 3   Years of education: Not on file   Highest education level: Not on file  Occupational History   Occupation: retired  Tobacco Use   Smoking status: Former    Current packs/day: 0.00    Types: Cigarettes    Quit date: 12/08/1986    Years since quitting: 36.9   Smokeless tobacco: Never  Vaping Use   Vaping status: Never Used  Substance and Sexual Activity   Alcohol use: Yes    Alcohol/week: 3.0 standard drinks of alcohol    Types: 3 Standard drinks or equivalent per week   Drug use: No   Sexual activity: Not on file  Other Topics Concern   Not on file  Social History Narrative   Married   Social Drivers of Health   Financial Resource Strain: Low Risk   (11/13/2023)   Overall Financial Resource Strain (CARDIA)    Difficulty of Paying Living Expenses: Not hard at all  Food Insecurity: No Food Insecurity (11/13/2023)   Hunger Vital Sign    Worried About Running Out of Food in the Last Year: Never true    Ran Out of Food in the Last Year: Never true  Transportation Needs: No Transportation Needs (11/13/2023)   PRAPARE - Administrator, Civil Service (Medical): No    Lack of Transportation (Non-Medical): No  Physical Activity: Inactive (11/13/2023)   Exercise Vital Sign  Days of Exercise per Week: 0 days    Minutes of Exercise per Session: 0 min  Stress: No Stress Concern Present (11/13/2023)   [employer] of Occupational Health - Occupational Stress Questionnaire    Feeling of Stress: Only a little  Social Connections: Socially Integrated (11/13/2023)   Social Connection and Isolation Panel    Frequency of Communication with Friends and Family: More than three times a week    Frequency of Social Gatherings with Friends and Family: Once a week    Attends Religious Services: More than 4 times per year    Active Member of [employer] or Organizations: Yes    Attends Engineer, structural: More than 4 times per year    Marital Status: Married    Tobacco Counseling Counseling given: Not Answered    Clinical Intake:  Pre-visit preparation completed: Yes  Pain : No/denies pain     BMI - recorded: 28.44 Nutritional Status: BMI 25 -29 Overweight Nutritional Risks: None Diabetes: No  Lab Results  Component Value Date   HGBA1C 6.2 06/25/2023   HGBA1C 6.1 12/15/2022   HGBA1C 6.1 06/15/2022     How often do you need to have someone help you when you read instructions, pamphlets, or other written materials from your doctor or pharmacy?: 1 - Never  Interpreter Needed?: No  Information entered by :: R. Torry Adamczak LPN   Activities of Daily Living     11/13/2023    9:30 AM  In your present state of health, do you  have any difficulty performing the following activities:  Hearing? 0  Vision? 0  Difficulty concentrating or making decisions? 0  Walking or climbing stairs? 0  Dressing or bathing? 0  Doing errands, shopping? 0  Preparing Food and eating ? N  Using the Toilet? N  In the past six months, have you accidently leaked urine? N  Do you have problems with loss of bowel control? N  Managing your Medications? N  Managing your Finances? N  Housekeeping or managing your Housekeeping? N    Patient Care Team: Marylynn Verneita CROME, MD as PCP - General (Internal Medicine)  I have updated your Care Teams any recent Medical Services you may have received from other providers in the past year.     Assessment:   This is a routine wellness examination for [place].  Hearing/Vision screen Hearing Screening - Comments:: No issues Vision Screening - Comments:: glasses   Goals Addressed             This Visit's Progress    Patient Stated       Wants to exercise more       Depression Screen     11/13/2023    9:37 AM 06/25/2023    3:32 PM 03/26/2023   10:09 AM 12/20/2022   11:19 AM 10/27/2022    9:32 AM 09/13/2022    9:43 AM 06/15/2022    9:33 AM  PHQ 2/9 Scores  PHQ - 2 Score 0 0 0 1 0 2 2  PHQ- 9 Score 0   6 0 5 5    Fall Risk     11/13/2023    9:32 AM 06/25/2023    3:32 PM 03/26/2023   10:09 AM 12/20/2022   11:19 AM 10/27/2022    9:32 AM  Fall Risk   Falls in the past year? 0 0 0 0 0  Number falls in past yr: 0 0 0 0 0  Injury with Fall? 0 0  0 0 0  Risk for fall due to : No Fall Risks No Fall Risks No Fall Risks No Fall Risks No Fall Risks  Follow up Falls evaluation completed;Falls prevention discussed Falls evaluation completed Falls evaluation completed Falls evaluation completed Falls evaluation completed    MEDICARE RISK AT HOME:  Medicare Risk at Home Any stairs in or around the home?: No If so, are there any without handrails?: No Home free of loose throw rugs in walkways, pet  beds, electrical cords, etc?: Yes Adequate lighting in your home to reduce risk of falls?: Yes Life alert?: No Use of a cane, walker or w/c?: No Grab bars in the bathroom?: Yes Shower chair or bench in shower?: No Elevated toilet seat or a handicapped toilet?: Yes  TIMED UP AND GO:  Was the test performed?  No  Cognitive Function: 6CIT completed    12/10/2017   10:18 AM 12/06/2016    9:35 AM  MMSE - Mini Mental State Exam  Orientation to time 5 5   Orientation to Place 5 5   Registration 3 3   Attention/ Calculation 5 5   Recall 3 3   Language- name 2 objects 2   Language- repeat 1 1  Language- follow 3 step command 3   Language- read & follow direction 1 1   Write a sentence 1   Copy design 1 1   Total score 30      Data saved with a previous flowsheet row definition        11/13/2023    9:43 AM 01/18/2022   11:12 AM 01/10/2021    3:43 PM 12/25/2018   11:15 AM  6CIT Screen  What Year? 0 points 0 points 0 points 0 points  What month? 0 points 0 points 0 points 0 points  What time? 0 points 0 points 0 points 0 points  Count back from 20 0 points 0 points 0 points 0 points  Months in reverse 0 points 0 points 0 points 0 points  Repeat phrase 0 points 0 points  0 points  Total Score 0 points 0 points  0 points    Immunizations Immunization History  Administered Date(s) Administered   Fluad Trivalent(High Dose 65+) 12/20/2022   Hepatitis A, Adult 02/20/2013   Hepatitis B 07/24/1994, 08/23/1994, 01/23/1995   Influenza, High Dose Seasonal PF 01/12/2016, 12/06/2016   Influenza,inj,Quad PF,6+ Mos 12/14/2014   Influenza,inj,quad, With Preservative 01/15/2018, 02/03/2019   Influenza-Unspecified 01/21/2013, 01/16/2020, 01/12/2021   Moderna Covid-19 Vaccine Bivalent Booster 29yrs & up 01/12/2021   Moderna Sars-Covid-2 Vaccination 04/28/2019, 05/26/2019, 02/19/2020, 07/27/2020, 04/18/2021, 02/02/2023   PNEUMOCOCCAL CONJUGATE-20 12/14/2021   PPD Test 03/03/2014    Pneumococcal Conjugate-13 01/22/2013   Pneumococcal Polysaccharide-23 07/27/2014   Tdap 01/22/2013   Zoster Recombinant(Shingrix) 10/31/2017, 01/03/2018   Zoster, Live 01/23/2012    Screening Tests Health Maintenance  Topic Date Due   Medicare Annual Wellness (AWV)  01/19/2023   DTaP/Tdap/Td (2 - Td or Tdap) 01/23/2023   COVID-19 Vaccine (8 - 2024-25 season) 03/30/2023   INFLUENZA VACCINE  11/16/2023   MAMMOGRAM  02/12/2024   Colonoscopy  12/28/2025   Pneumococcal Vaccine: 50+ Years  Completed   Hepatitis B Vaccines  Completed   DEXA SCAN  Completed   Hepatitis C Screening  Completed   Zoster Vaccines- Shingrix  Completed   HPV VACCINES  Aged Out   Meningococcal B Vaccine  Aged Out    Health Maintenance  Health Maintenance Due  Topic Date Due  Medicare Annual Wellness (AWV)  01/19/2023   DTaP/Tdap/Td (2 - Td or Tdap) 01/23/2023   COVID-19 Vaccine (8 - 2024-25 season) 03/30/2023   Health Maintenance Items Addressed: Discussed the need to update Tetanus vaccine and get flu and covid vaccines annually. Patient declines Dexa/Bone density  Additional Screening:  Vision Screening: Recommended annual ophthalmology exams for early detection of glaucoma and other disorders of the eye.  Up to date [facility] Would you like a referral to an eye doctor? No    Dental Screening: Recommended annual dental exams for proper oral hygiene  Community Resource Referral / Chronic Care Management: CRR required this visit?  No   CCM required this visit?  No   Plan:    I have personally reviewed and noted the following in the patient's chart:   Medical and social history Use of alcohol, tobacco or illicit drugs  Current medications and supplements including opioid prescriptions. Patient is not currently taking opioid prescriptions. Functional ability and status Nutritional status Physical activity Advanced directives List of other physicians Hospitalizations, surgeries, and ER  visits in previous 12 months Vitals Screenings to include cognitive, depression, and falls Referrals and appointments  In addition, I have reviewed and discussed with patient certain preventive protocols, quality metrics, and best practice recommendations. A written personalized care plan for preventive services as well as general preventive health recommendations were provided to patient.   Angeline Fredericks, LPN   2/70/7974   After Visit Summary: (MyChart) Due to this being a telephonic visit, the after visit summary with patients personalized plan was offered to patient via MyChart   Notes: Nothing significant to report at this time.

## 2023-11-26 DIAGNOSIS — B009 Herpesviral infection, unspecified: Secondary | ICD-10-CM | POA: Diagnosis not present

## 2023-11-28 DIAGNOSIS — H34831 Tributary (branch) retinal vein occlusion, right eye, with macular edema: Secondary | ICD-10-CM | POA: Diagnosis not present

## 2023-12-03 ENCOUNTER — Other Ambulatory Visit: Payer: Self-pay | Admitting: Internal Medicine

## 2023-12-09 DIAGNOSIS — S46911A Strain of unspecified muscle, fascia and tendon at shoulder and upper arm level, right arm, initial encounter: Secondary | ICD-10-CM | POA: Diagnosis not present

## 2023-12-18 ENCOUNTER — Other Ambulatory Visit: Payer: Self-pay | Admitting: Internal Medicine

## 2023-12-20 DIAGNOSIS — M25511 Pain in right shoulder: Secondary | ICD-10-CM | POA: Diagnosis not present

## 2023-12-26 ENCOUNTER — Ambulatory Visit: Admitting: Internal Medicine

## 2024-01-01 ENCOUNTER — Ambulatory Visit: Admitting: Internal Medicine

## 2024-01-01 ENCOUNTER — Encounter: Payer: Self-pay | Admitting: Internal Medicine

## 2024-01-01 VITALS — BP 128/78 | HR 63 | Ht 62.5 in

## 2024-01-01 DIAGNOSIS — E7800 Pure hypercholesterolemia, unspecified: Secondary | ICD-10-CM | POA: Diagnosis not present

## 2024-01-01 DIAGNOSIS — G894 Chronic pain syndrome: Secondary | ICD-10-CM

## 2024-01-01 DIAGNOSIS — M130 Polyarthritis, unspecified: Secondary | ICD-10-CM | POA: Diagnosis not present

## 2024-01-01 DIAGNOSIS — M542 Cervicalgia: Secondary | ICD-10-CM | POA: Diagnosis not present

## 2024-01-01 DIAGNOSIS — K2100 Gastro-esophageal reflux disease with esophagitis, without bleeding: Secondary | ICD-10-CM

## 2024-01-01 DIAGNOSIS — Z1231 Encounter for screening mammogram for malignant neoplasm of breast: Secondary | ICD-10-CM

## 2024-01-01 DIAGNOSIS — R7303 Prediabetes: Secondary | ICD-10-CM

## 2024-01-01 DIAGNOSIS — Z23 Encounter for immunization: Secondary | ICD-10-CM | POA: Diagnosis not present

## 2024-01-01 LAB — LDL CHOLESTEROL, DIRECT: Direct LDL: 57 mg/dL

## 2024-01-01 LAB — SEDIMENTATION RATE: Sed Rate: 4 mm/h (ref 0–30)

## 2024-01-01 LAB — LIPID PANEL
Cholesterol: 142 mg/dL (ref 0–200)
HDL: 65.4 mg/dL (ref >39.00)
LDL Cholesterol: 47 mg/dL (ref 0–99)
NonHDL: 76.95
Total CHOL/HDL Ratio: 2
Triglycerides: 151 mg/dL — ABNORMAL HIGH (ref 0.0–149.0)
VLDL: 30.2 mg/dL (ref 0.0–40.0)

## 2024-01-01 LAB — C-REACTIVE PROTEIN: CRP: <1 mg/dL (ref 0.5–20.0)

## 2024-01-01 LAB — HEMOGLOBIN A1C: Hgb A1c MFr Bld: 6.4 % (ref 4.6–6.5)

## 2024-01-01 MED ORDER — ESOMEPRAZOLE MAGNESIUM 40 MG PO CPDR: 40.0000 mg | DELAYED_RELEASE_CAPSULE | Freq: Every day | ORAL | 3 refills | Status: DC

## 2024-01-01 MED ORDER — BUTALBITAL-APAP-CAFF-COD 50-325-40-30 MG PO CAPS
1.0000 | ORAL_CAPSULE | Freq: Four times a day (QID) | ORAL | 0 refills | Status: DC | PRN
Start: 2024-01-01 — End: 2024-02-07

## 2024-01-01 MED ORDER — ESOMEPRAZOLE MAGNESIUM 40 MG PO CPDR: 40.0000 mg | DELAYED_RELEASE_CAPSULE | Freq: Two times a day (BID) | ORAL | 0 refills | Status: DC

## 2024-01-01 NOTE — Patient Instructions (Addendum)
 Your symptoms sound like gastritis which can caused by ibuprofen   STOP THE ibuprofen .  Increase the nexium  to 2 times daily  30 mitues prior to breakfast and dinner.    Use voltaren gel up to 4 times daily on your hands and neck   Increase fioricet to 4 times daily for the next month    You can take 3000 mg of acetominophen (tylenol) every day safely  In divided doses .   this means if you use 4 Firoicet daily,  you can add 3 doses of tylenol 500 mg   daily as well to control your joint pain    YOUR MAMMOGRAM Ihas been ordered , PLEASE CALL AND GET THIS SCHEDULED! [facility] - call [phone]  Erin Aguilar does  the scheduling for [facility] as well       You are OVERDUE for your tetanus-diptheria-pertussis vaccine   (TDaP)   Please get this done at your pharmacy l;  it will be PAID FOR MY MEDICARE ONLY AT [facility]

## 2024-01-01 NOTE — Progress Notes (Unsigned)
 Subjective:  Patient ID: Erin Aguilar, female    DOB: [birth year]  Age: 73 y.o. MRN: [id number]  CC: The primary encounter diagnosis was Encounter for screening mammogram for malignant neoplasm of breast. Diagnoses of Prediabetes, Pure hypercholesterolemia, Neck pain, Polyarthritis, Gastroesophageal reflux disease with esophagitis without hemorrhage, Need for influenza vaccination, and Chronic pain syndrome were also pertinent to this visit.   HPI Erin Aguilar presents for  Chief Complaint  Patient presents with   Medical Management of Chronic Issues    6 month follow up    [name] reports that she experienced a few tick bites this summer but did not develop any fever, headaches, rash, or joint pain.   She recently injured her right shoulder when she fell off the bed  while asleep . She was evaluated by [facility] .  There were no fractures, and she was referred to PT  which she completed on her own at home.  She reports some persistent soreness with abduction and flexion at the shoulder,  but states that her movement is not restricted.     1) GERD: her symptoms have been recurrent  for the past 2 weeks accompanied by nausea,  occurring 2-3 times per week .  The episodes have been present upon waking despite daily  use of nexium  . She has also  been having  intermittent episodes of transient  esophageal pain during meals which would be  relieved by burping and occasionally /regurgitation, occurring only with solid food.  She uses ibuprofen as needed for arthritis on a daily basis   2) Diffuse joint pain:  she reports chronic pain involving hands,  neck elbows and shoulders  which she has been treating  with ibuprofen   Outpatient Medications Prior to Visit  Medication Sig Dispense Refill   aspirin  81 MG chewable tablet Chew 81 mg by mouth.     atorvastatin  (LIPITOR) 80 MG tablet TAKE ONE TABLET BY MOUTH ONCE DAILY 90 tablet 3   cyclobenzaprine  (FLEXERIL ) 10 MG tablet TAKE ONE  TABLET BY MOUTH THREE TIMES DAILY AS NEEDED FOR MUSCLE SPASMS 90 tablet 1   DULoxetine  (CYMBALTA ) 60 MG capsule TAKE ONE CAPSULE BY MOUTH ONCE DAILY 90 capsule 1   escitalopram  (LEXAPRO ) 10 MG tablet Take 1 tablet (10 mg total) by mouth daily for 7 days.     gabapentin  (NEURONTIN ) 100 MG capsule TAKE ONE CAPSULE BY MOUTH THREE TIMES DAILY (INCREASE BY 100MG  WEEKLY IF NEEDED) 90 capsule 3   Multiple Vitamin (MULTIVITAMIN) tablet Take 1 tablet by mouth daily.     pramipexole  (MIRAPEX ) 0.25 MG tablet TAKE ONE TABLET BY MOUTH AT BEDTIME 90 tablet 1   butalbital -apap-caffeine -codeine (FIORICET WITH CODEINE) 50-325-40-30 MG capsule Take 1 capsule by mouth every 6 (six) hours as needed for headache. 60 capsule 5   esomeprazole  (NEXIUM ) 40 MG capsule Take 1 capsule (40 mg total) by mouth daily. 90 capsule 3   Facility-Administered Medications Prior to Visit  Medication Dose Route Frequency Provider Last Rate Last Admin   0.9 %  sodium chloride  infusion  500 mL Intravenous Continuous Danis, Victory LITTIE MOULD, MD        Review of Systems;  Patient denies headache, fevers, malaise, unintentional weight loss, skin rash, eye pain, sinus congestion and sinus pain, sore throat,  hemoptysis , cough, dyspnea, wheezing, palpitations, orthopnea, edema, abdominal pain, nausea, melena, diarrhea, constipation, flank pain, dysuria, hematuria, urinary  Frequency, nocturia, numbness, tingling, seizures,  Focal weakness, Loss of consciousness,  Tremor, insomnia, depression, anxiety, and suicidal ideation.      Objective:  BP 128/78   Pulse 63   Ht 5' 2.5 (1.588 m)   SpO2 97%   BMI 28.44 kg/m   BP Readings from Last 3 Encounters:  01/01/24 128/78  06/25/23 116/78  03/26/23 122/72    Wt Readings from Last 3 Encounters:  11/13/23 158 lb (71.7 kg)  06/25/23 155 lb 9.6 oz (70.6 kg)  03/26/23 151 lb 12.8 oz (68.9 kg)    Physical Exam Vitals reviewed.  Constitutional:      General: She is not in acute  distress.    Appearance: Normal appearance. She is normal weight. She is not ill-appearing, toxic-appearing or diaphoretic.  HENT:     Head: Normocephalic.  Eyes:     General: No scleral icterus.       Right eye: No discharge.        Left eye: No discharge.     Conjunctiva/sclera: Conjunctivae normal.  Cardiovascular:     Rate and Rhythm: Normal rate and regular rhythm.     Heart sounds: Normal heart sounds.  Pulmonary:     Effort: Pulmonary effort is normal. No respiratory distress.     Breath sounds: Normal breath sounds.  Musculoskeletal:        General: Normal range of motion.  Skin:    General: Skin is warm and dry.  Neurological:     General: No focal deficit present.     Mental Status: She is alert and oriented to person, place, and time. Mental status is at baseline.  Psychiatric:        Mood and Affect: Mood normal.        Behavior: Behavior normal.        Thought Content: Thought content normal.        Judgment: Judgment normal.     Lab Results  Component Value Date   HGBA1C 6.4 01/01/2024   HGBA1C 6.2 06/25/2023   HGBA1C 6.1 12/15/2022    Lab Results  Component Value Date   CREATININE 0.85 01/01/2024   CREATININE 0.96 06/25/2023   CREATININE 0.75 12/15/2022    Lab Results  Component Value Date   WBC 4.5 09/05/2023   HGB 12.5 09/05/2023   HCT 36.7 09/05/2023   PLT 185 09/05/2023   GLUCOSE 110 (H) 01/01/2024   CHOL 142 01/01/2024   TRIG 151.0 (H) 01/01/2024   HDL 65.40 01/01/2024   LDLDIRECT 57.0 01/01/2024   LDLCALC 47 01/01/2024   ALT 20 01/01/2024   AST 24 01/01/2024   NA 134 (L) 01/01/2024   K 4.3 01/01/2024   CL 108 01/01/2024   CREATININE 0.85 01/01/2024   BUN 19 01/01/2024   CO2 26 01/01/2024   TSH 1.97 06/25/2023   HGBA1C 6.4 01/01/2024    No results found.  Assessment & Plan:  .Encounter for screening mammogram for malignant neoplasm of breast -     3D Screening Mammogram, Left and Right; Future  Prediabetes Assessment &  Plan: Previously improved with management of  obesity.  She has lost 40 lbs with wegovy  but was unable to continue any GLP agonist due to insurance nonpayment. Her A1c has risen with weight gain of 10 lbs over the past year.   Lab Results  Component Value Date   HGBA1C 6.4 01/01/2024     Orders: -     Comprehensive metabolic panel with GFR -     Hemoglobin A1c  Pure hypercholesterolemia -  Lipid panel -     LDL cholesterol, direct  Neck pain -     Butalbital -APAP-Caff-Cod; Take 1 capsule by mouth every 6 (six) hours as needed for headache. And joint pain  Dispense: 120 capsule; Refill: 0  Polyarthritis Assessment & Plan: Inflammatory markers are normal,  but Rheumatoid factor is positive.  She has been advised to stop using NSAIDS  given her current symptoms of gastritis,  and offer to rheumatology will be given   Lab Results  Component Value Date   ANA POS (A) 07/15/2012   RF 29 (H) 01/01/2024    Lab Results  Component Value Date   ESRSEDRATE 4 01/01/2024   Lab Results  Component Value Date   ANA POS (A) 07/15/2012   RF 29 (H) 01/01/2024     Orders: -     Sedimentation rate -     C-reactive protein -     ANA -     Rheumatoid factor  Gastroesophageal reflux disease with esophagitis without hemorrhage Assessment & Plan: Advised to Increase nexium  to twice daily for one month.  Dc all oral NSAIDs . Use tylenol and fioriciet and topical voltaren for joint pain.  Follow up one month    Need for influenza vaccination -     Flu vaccine HIGH DOSE PF(Fluzone Trivalent)  Chronic pain syndrome Assessment & Plan: She has been reminded to avoid  use of NSAIDs given history of bariatric surgery, recent decline  in GFR and now , gastritis  .  Tylenol advised.  cymbalta  started in May for depression/anxiety with concurrent improvement in pain management .  Fioricet  quantity has been increased to 4 daily to allow relief of pain    Other orders -     Esomeprazole   Magnesium ; Take 1 capsule (40 mg total) by mouth 2 (two) times daily before a meal.  Dispense: 60 capsule; Refill: 0     Follow-up: Return in about 4 weeks (around 01/29/2024) for GASTRITIS .   Verneita LITTIE Kettering, MD

## 2024-01-01 NOTE — Assessment & Plan Note (Signed)
 Increase nexium  to bid for one month.  Dc all oral NSAIDs . Use tylenol and fioriciet and topical voltaren for joint pain.  Follow up one month

## 2024-01-03 ENCOUNTER — Ambulatory Visit: Payer: Self-pay | Admitting: Internal Medicine

## 2024-01-03 DIAGNOSIS — M130 Polyarthritis, unspecified: Secondary | ICD-10-CM | POA: Insufficient documentation

## 2024-01-03 LAB — ANA: Anti Nuclear Antibody (ANA): POSITIVE — AB

## 2024-01-03 LAB — ANTI-NUCLEAR AB-TITER (ANA TITER): ANA Titer 1: 1:320 {titer} — ABNORMAL HIGH

## 2024-01-03 LAB — RHEUMATOID FACTOR: Rheumatoid fact SerPl-aCnc: 29 [IU]/mL — ABNORMAL HIGH (ref <14)

## 2024-01-03 NOTE — Assessment & Plan Note (Addendum)
 Previously improved with management of  obesity.  She has lost 40 lbs with wegovy  but was unable to continue any GLP agonist due to insurance nonpayment. Her A1c has risen with weight gain of 10 lbs over the past year.   Lab Results  Component Value Date   HGBA1C 6.4 01/01/2024

## 2024-01-03 NOTE — Assessment & Plan Note (Signed)
 She has been reminded to avoid  use of NSAIDs given history of bariatric surgery, recent decline  in GFR and now , gastritis  .  Tylenol advised.  cymbalta  started in May for depression/anxiety with concurrent improvement in pain management .  Fioricet  quantity has been increased to 4 daily to allow relief of pain

## 2024-01-03 NOTE — Assessment & Plan Note (Addendum)
 Inflammatory markers are normal,  but Rheumatoid factor is positive.  She has been advised to stop using NSAIDS  given her current symptoms of gastritis,  and offer to rheumatology will be given   Lab Results  Component Value Date   ANA POS (A) 07/15/2012   RF 29 (H) 01/01/2024    Lab Results  Component Value Date   ESRSEDRATE 4 01/01/2024   Lab Results  Component Value Date   ANA POS (A) 07/15/2012   RF 29 (H) 01/01/2024

## 2024-01-05 LAB — COMPREHENSIVE METABOLIC PANEL WITH GFR
ALT: 20 U/L (ref 0–35)
AST: 24 U/L (ref 0–37)
Albumin: 4.2 g/dL (ref 3.5–5.2)
Alkaline Phosphatase: 108 U/L (ref 39–117)
BUN: 19 mg/dL (ref 6–23)
CO2: 26 meq/L (ref 19–32)
Calcium: 9.5 mg/dL (ref 8.4–10.5)
Chloride: 108 meq/L (ref 96–112)
Creatinine, Ser: 0.85 mg/dL (ref 0.40–1.20)
GFR: 66.99 mL/min (ref >60.00)
Glucose, Bld: 110 mg/dL — ABNORMAL HIGH (ref 70–99)
Potassium: 4.3 meq/L (ref 3.5–5.1)
Sodium: 139 meq/L (ref 135–145)
Total Bilirubin: 0.4 mg/dL (ref 0.2–1.2)
Total Protein: 7 g/dL (ref 6.0–8.3)

## 2024-01-08 ENCOUNTER — Other Ambulatory Visit: Payer: Self-pay | Admitting: Internal Medicine

## 2024-01-08 DIAGNOSIS — M130 Polyarthritis, unspecified: Secondary | ICD-10-CM

## 2024-01-17 DIAGNOSIS — H34831 Tributary (branch) retinal vein occlusion, right eye, with macular edema: Secondary | ICD-10-CM | POA: Diagnosis not present

## 2024-01-17 DIAGNOSIS — G4452 New daily persistent headache (NDPH): Secondary | ICD-10-CM | POA: Diagnosis not present

## 2024-01-17 DIAGNOSIS — H43813 Vitreous degeneration, bilateral: Secondary | ICD-10-CM | POA: Diagnosis not present

## 2024-01-17 DIAGNOSIS — H2513 Age-related nuclear cataract, bilateral: Secondary | ICD-10-CM | POA: Diagnosis not present

## 2024-01-28 DIAGNOSIS — Z859 Personal history of malignant neoplasm, unspecified: Secondary | ICD-10-CM | POA: Diagnosis not present

## 2024-01-28 DIAGNOSIS — Z86018 Personal history of other benign neoplasm: Secondary | ICD-10-CM | POA: Diagnosis not present

## 2024-01-28 DIAGNOSIS — D485 Neoplasm of uncertain behavior of skin: Secondary | ICD-10-CM | POA: Diagnosis not present

## 2024-01-28 DIAGNOSIS — L438 Other lichen planus: Secondary | ICD-10-CM | POA: Diagnosis not present

## 2024-01-28 DIAGNOSIS — L578 Other skin changes due to chronic exposure to nonionizing radiation: Secondary | ICD-10-CM | POA: Diagnosis not present

## 2024-01-28 DIAGNOSIS — Z872 Personal history of diseases of the skin and subcutaneous tissue: Secondary | ICD-10-CM | POA: Diagnosis not present

## 2024-01-29 ENCOUNTER — Ambulatory Visit: Admitting: Internal Medicine

## 2024-01-29 ENCOUNTER — Encounter: Payer: Self-pay | Admitting: Internal Medicine

## 2024-01-29 VITALS — BP 110/62 | HR 70 | Ht 62.5 in | Wt 161.6 lb

## 2024-01-29 DIAGNOSIS — T39395A Adverse effect of other nonsteroidal anti-inflammatory drugs [NSAID], initial encounter: Secondary | ICD-10-CM

## 2024-01-29 DIAGNOSIS — M130 Polyarthritis, unspecified: Secondary | ICD-10-CM | POA: Diagnosis not present

## 2024-01-29 DIAGNOSIS — K2960 Other gastritis without bleeding: Secondary | ICD-10-CM

## 2024-01-29 MED ORDER — PROMETHAZINE HCL 12.5 MG PO TABS: 12.5000 mg | ORAL_TABLET | Freq: Three times a day (TID) | ORAL | 2 refills | Status: DC | PRN

## 2024-01-29 MED ORDER — ESOMEPRAZOLE MAGNESIUM 40 MG PO CPDR: 40.0000 mg | DELAYED_RELEASE_CAPSULE | Freq: Two times a day (BID) | ORAL | 1 refills | Status: DC

## 2024-01-29 NOTE — Progress Notes (Unsigned)
 Subjective:  Patient ID: Erin Aguilar, female    DOB: July 08, 1950  Age: 73 y.o. MRN: [id number]  CC: There were no encounter diagnoses.   HPI Erin Aguilar presents for  Chief Complaint  Patient presents with   Medical Management of Chronic Issues    1 month follow up    1) follow up on GERD/dyspepsia .  Still nauseated frequently . Cakes and pies make her nauseated but candy doesn't.  Has to stop eating due to sudden onset of nausea.  Occurs with and without eating  . Remote history of bariatric  surgery in 2015 gastric bypass .  History of chronic use of  NSAIDS daily (ibuprofen ) for joint pain until 4 weeks ago .   2) follow up on positive ANA    Outpatient Medications Prior to Visit  Medication Sig Dispense Refill   aspirin  81 MG chewable tablet Chew 81 mg by mouth.     atorvastatin  (LIPITOR) 80 MG tablet TAKE ONE TABLET BY MOUTH ONCE DAILY 90 tablet 3   butalbital -apap-caffeine -codeine (FIORICET WITH CODEINE) 50-325-40-30 MG capsule Take 1 capsule by mouth every 6 (six) hours as needed for headache. And joint pain 120 capsule 0   cyclobenzaprine  (FLEXERIL ) 10 MG tablet TAKE ONE TABLET BY MOUTH THREE TIMES DAILY AS NEEDED FOR MUSCLE SPASMS 90 tablet 1   DULoxetine  (CYMBALTA ) 60 MG capsule TAKE ONE CAPSULE BY MOUTH ONCE DAILY 90 capsule 1   escitalopram  (LEXAPRO ) 10 MG tablet Take 1 tablet (10 mg total) by mouth daily for 7 days.     esomeprazole  (NEXIUM ) 40 MG capsule Take 1 capsule (40 mg total) by mouth 2 (two) times daily before a meal. 60 capsule 0   gabapentin  (NEURONTIN ) 100 MG capsule TAKE ONE CAPSULE BY MOUTH THREE TIMES DAILY (INCREASE BY 100MG  WEEKLY IF NEEDED) 90 capsule 3   Multiple Vitamin (MULTIVITAMIN) tablet Take 1 tablet by mouth daily.     pramipexole  (MIRAPEX ) 0.25 MG tablet TAKE ONE TABLET BY MOUTH AT BEDTIME 90 tablet 1   Facility-Administered Medications Prior to Visit  Medication Dose Route Frequency Provider Last Rate Last Admin   0.9 %  sodium  chloride infusion  500 mL Intravenous Continuous Danis, Victory LITTIE MOULD, MD        Review of Systems;  Patient denies headache, fevers, malaise, unintentional weight loss, skin rash, eye pain, sinus congestion and sinus pain, sore throat, dysphagia,  hemoptysis , cough, dyspnea, wheezing, chest pain, palpitations, orthopnea, edema, abdominal pain, nausea, melena, diarrhea, constipation, flank pain, dysuria, hematuria, urinary  Frequency, nocturia, numbness, tingling, seizures,  Focal weakness, Loss of consciousness,  Tremor, insomnia, depression, anxiety, and suicidal ideation.      Objective:  BP 110/62   Pulse 70   Ht 5' 2.5 (1.588 m)   Wt 161 lb 9.6 oz (73.3 kg)   SpO2 97%   BMI 29.09 kg/m   BP Readings from Last 3 Encounters:  01/29/24 110/62  01/01/24 128/78  06/25/23 116/78    Wt Readings from Last 3 Encounters:  01/29/24 161 lb 9.6 oz (73.3 kg)  11/13/23 158 lb (71.7 kg)  06/25/23 155 lb 9.6 oz (70.6 kg)    Physical Exam  Lab Results  Component Value Date   HGBA1C 6.4 01/01/2024   HGBA1C 6.2 06/25/2023   HGBA1C 6.1 12/15/2022    Lab Results  Component Value Date   CREATININE 0.85 01/01/2024   CREATININE 0.96 06/25/2023   CREATININE 0.75 12/15/2022    Lab Results  Component Value Date   WBC 4.5 09/05/2023   HGB 12.5 09/05/2023   HCT 36.7 09/05/2023   PLT 185 09/05/2023   GLUCOSE 110 (H) 01/01/2024   CHOL 142 01/01/2024   TRIG 151.0 (H) 01/01/2024   HDL 65.40 01/01/2024   LDLDIRECT 57.0 01/01/2024   LDLCALC 47 01/01/2024   ALT 20 01/01/2024   AST 24 01/01/2024   NA 139 01/01/2024   K 4.3 01/01/2024   CL 108 01/01/2024   CREATININE 0.85 01/01/2024   BUN 19 01/01/2024   CO2 26 01/01/2024   TSH 1.97 06/25/2023   HGBA1C 6.4 01/01/2024    No results found.  Assessment & Plan:  .There are no diagnoses linked to this encounter.   I spent 34 minutes on the day of this face to face encounter reviewing patient's  most recent visit with cardiology,   nephrology,  and neurology,  prior relevant surgical and non surgical procedures, recent  labs and imaging studies, counseling on weight management,  reviewing the assessment and plan with patient, and post visit ordering and reviewing of  diagnostics and therapeutics with patient  .   Follow-up: No follow-ups on file.   Verneita LITTIE Kettering, MD

## 2024-01-29 NOTE — Patient Instructions (Addendum)
 Continue taking nexium  twice daily  Phenergan  as needed for nausea    [facility] referral in progress

## 2024-01-30 DIAGNOSIS — K2960 Other gastritis without bleeding: Secondary | ICD-10-CM | POA: Insufficient documentation

## 2024-01-30 NOTE — Assessment & Plan Note (Signed)
 Inflammatory markers are normal,  but Rheumatoid factor is positive and ANA is positive .  She has been advised to stop using NSAIDS  given her current symptoms of gastritis,  and offer to rheumatology ws in process.   Lab Results  Component Value Date   ANA POSITIVE (A) 01/01/2024   RF 29 (H) 01/01/2024    Lab Results  Component Value Date   ESRSEDRATE 4 01/01/2024   Lab Results  Component Value Date   ANA POSITIVE (A) 01/01/2024   RF 29 (H) 01/01/2024

## 2024-01-30 NOTE — Assessment & Plan Note (Addendum)
 Continue bid nexium .  NSAID abstinence.  Adding promethazine . Will add sucralfate if pain returns and GI referral advised if symptoms do not resolve

## 2024-01-31 DIAGNOSIS — M47812 Spondylosis without myelopathy or radiculopathy, cervical region: Secondary | ICD-10-CM | POA: Diagnosis not present

## 2024-01-31 DIAGNOSIS — R7689 Other specified abnormal immunological findings in serum: Secondary | ICD-10-CM | POA: Diagnosis not present

## 2024-01-31 DIAGNOSIS — Z79899 Other long term (current) drug therapy: Secondary | ICD-10-CM | POA: Diagnosis not present

## 2024-01-31 DIAGNOSIS — M3500 Sicca syndrome, unspecified: Secondary | ICD-10-CM | POA: Diagnosis not present

## 2024-01-31 DIAGNOSIS — M150 Primary generalized (osteo)arthritis: Secondary | ICD-10-CM | POA: Diagnosis not present

## 2024-02-06 ENCOUNTER — Other Ambulatory Visit: Payer: Self-pay | Admitting: Internal Medicine

## 2024-02-06 DIAGNOSIS — M542 Cervicalgia: Secondary | ICD-10-CM

## 2024-03-04 ENCOUNTER — Ambulatory Visit
Admission: RE | Admit: 2024-03-04 | Discharge: 2024-03-04 | Disposition: A | Discharge status: Home / Self Care | Source: Ambulatory Visit | Attending: Internal Medicine | Admitting: Internal Medicine

## 2024-03-04 DIAGNOSIS — Z1231 Encounter for screening mammogram for malignant neoplasm of breast: Secondary | ICD-10-CM | POA: Insufficient documentation

## 2024-03-07 ENCOUNTER — Other Ambulatory Visit: Payer: Self-pay | Admitting: Internal Medicine

## 2024-03-07 DIAGNOSIS — M542 Cervicalgia: Secondary | ICD-10-CM

## 2024-03-12 DIAGNOSIS — M47812 Spondylosis without myelopathy or radiculopathy, cervical region: Secondary | ICD-10-CM | POA: Diagnosis not present

## 2024-03-12 DIAGNOSIS — M150 Primary generalized (osteo)arthritis: Secondary | ICD-10-CM | POA: Diagnosis not present

## 2024-03-12 DIAGNOSIS — Z79899 Other long term (current) drug therapy: Secondary | ICD-10-CM | POA: Diagnosis not present

## 2024-03-12 DIAGNOSIS — M3500 Sicca syndrome, unspecified: Secondary | ICD-10-CM | POA: Diagnosis not present

## 2024-03-12 DIAGNOSIS — R7689 Other specified abnormal immunological findings in serum: Secondary | ICD-10-CM | POA: Diagnosis not present

## 2024-03-17 DIAGNOSIS — H0015 Chalazion left lower eyelid: Secondary | ICD-10-CM | POA: Diagnosis not present

## 2024-03-17 DIAGNOSIS — M3501 Sicca syndrome with keratoconjunctivitis: Secondary | ICD-10-CM | POA: Diagnosis not present

## 2024-03-17 DIAGNOSIS — H34831 Tributary (branch) retinal vein occlusion, right eye, with macular edema: Secondary | ICD-10-CM | POA: Diagnosis not present

## 2024-04-16 ENCOUNTER — Other Ambulatory Visit: Payer: Self-pay | Admitting: Internal Medicine

## 2024-05-07 ENCOUNTER — Other Ambulatory Visit: Payer: Self-pay | Admitting: Internal Medicine

## 2024-05-21 ENCOUNTER — Other Ambulatory Visit: Payer: Self-pay | Admitting: Internal Medicine

## 2024-11-17 ENCOUNTER — Ambulatory Visit
